# Patient Record
Sex: Female | Born: 1935 | Race: White | Hispanic: No | Marital: Single | Smoking: Never smoker
Health system: Southern US, Community
[De-identification: ages and names within clinical notes are randomized; demographics above are authoritative.]

## PROBLEM LIST (undated history)

## (undated) DIAGNOSIS — T8859XA Other complications of anesthesia, initial encounter: Secondary | ICD-10-CM

## (undated) HISTORY — PX: CATARACT EXTRACTION, BILATERAL: SHX1313

## (undated) HISTORY — PX: ABDOMINAL HYSTERECTOMY: SHX81

## (undated) HISTORY — PX: CHOLECYSTECTOMY: SHX55

## (undated) HISTORY — PX: ERCP: SHX60

## (undated) HISTORY — PX: APPENDECTOMY: SHX54

---

## 1998-08-27 ENCOUNTER — Ambulatory Visit (HOSPITAL_COMMUNITY): Admission: RE | Admit: 1998-08-27 | Discharge: 1998-08-27 | Payer: Self-pay | Admitting: Surgery

## 2000-08-17 ENCOUNTER — Encounter: Payer: Self-pay | Admitting: Internal Medicine

## 2000-08-17 ENCOUNTER — Encounter: Admission: RE | Admit: 2000-08-17 | Discharge: 2000-08-17 | Payer: Self-pay | Admitting: Internal Medicine

## 2002-02-27 ENCOUNTER — Inpatient Hospital Stay (HOSPITAL_COMMUNITY): Admission: EM | Admit: 2002-02-27 | Discharge: 2002-03-03 | Payer: Self-pay | Admitting: Emergency Medicine

## 2002-02-28 ENCOUNTER — Encounter: Payer: Self-pay | Admitting: Internal Medicine

## 2004-10-17 ENCOUNTER — Other Ambulatory Visit: Admission: RE | Admit: 2004-10-17 | Discharge: 2004-10-17 | Payer: Self-pay | Admitting: Radiology

## 2005-01-23 ENCOUNTER — Ambulatory Visit: Payer: Self-pay | Admitting: Internal Medicine

## 2005-02-09 ENCOUNTER — Ambulatory Visit: Payer: Self-pay | Admitting: Internal Medicine

## 2006-08-28 ENCOUNTER — Ambulatory Visit (HOSPITAL_COMMUNITY): Admission: RE | Admit: 2006-08-28 | Discharge: 2006-08-29 | Payer: Self-pay | Admitting: Ophthalmology

## 2006-08-28 ENCOUNTER — Encounter (INDEPENDENT_AMBULATORY_CARE_PROVIDER_SITE_OTHER): Payer: Self-pay | Admitting: Specialist

## 2007-12-26 ENCOUNTER — Encounter: Admission: RE | Admit: 2007-12-26 | Discharge: 2007-12-26 | Payer: Self-pay | Admitting: Internal Medicine

## 2009-01-05 ENCOUNTER — Encounter: Admission: RE | Admit: 2009-01-05 | Discharge: 2009-01-05 | Payer: Self-pay | Admitting: Internal Medicine

## 2010-01-11 ENCOUNTER — Encounter: Admission: RE | Admit: 2010-01-11 | Discharge: 2010-01-11 | Payer: Self-pay | Admitting: Internal Medicine

## 2010-09-09 NOTE — H&P (Signed)
NAME:  Alison Carpenter, Alison Carpenter              ACCOUNT NO.:  [id number]   MEDICAL RECORD NO.:  [id number]                   PATIENT TYPE:  INP   LOCATION:  0474                                 FACILITY:  [facility]   PHYSICIAN:  Wilhemina Bonito. Marina Goodell, M.D. [facility]             DATE OF BIRTH:  [birth year]   DATE OF ADMISSION:  02/27/2002  DATE OF DISCHARGE:                                HISTORY & PHYSICAL   CHIEF COMPLAINT:  Worsening of intermittent right upper quadrant pain now  associated with nausea.   HISTORY OF PRESENT ILLNESS:  Ms. Alison Carpenter is status post remote  cholecystectomy.  For several years, she has had intermittent upper  abdominal and right upper quadrant discomfort.  It has never reached  terribly severe levels.  It was associated with some nausea and vomiting  about two weeks ago.  She was seen by Dr. Su Hilt, her Primary Care  Alison Carpenter, and he provided her with some pain medication called Talacen,  which she stopped because it caused her to have nausea and vomiting.  The  patient was referred to see Dr. Yancey Carpenter this past Monday and he did see  her in the office.  However, in the meantime the patient had been  hospitalized when she was in [place], [place], last week with an episode of  angioedema of undetermined etiology, treated with steroids and  antihistamines at the hospital there.  Dr. Marina Goodell got a CMET which showed her  to have a low potassium at 2.8.  Liver tests were abnormal with normal total  bilirubin 1.1 as well normal alkaline phosphatase at 110.  Her AST was 67,  her ALT was 383.  On the 4th of November, she underwent abdominal ultrasound  which was read by Dr. Lina Sar as having a prominent common bile duct  consistent with her post-cholecystectomy state and mild fatty liver changes  were present.  Dr. Marina Goodell had set her up to have an upper endoscopy on the  17th.  Her symptoms got worse over the last few days and she made  arrangements for the upper endoscopy  to be moved up to the 7th of November.  However, she did not make it to that appointment as she had such severe pain  that Dr. Lamar Sprinkles office advised her to go to the emergency room where he  ended up admitting her with severe right upper quadrant pain.  She had  developed nausea and vomiting on the day of admission, which was not blood  or coffee ground containing.  Stools were unaffected and were unremarkable.  In the emergency room her white blood cell count was normal and her  hemoglobin was 15.1.  Lipase was 198, amylase was 245.  She still was  somewhat potassium depleted with potassium of 3.2.  Total bilirubin,  alkaline phosphatase, AST, and ALT were all up compared with levels taken  earlier in the week and are dictated below.  She was admitted by Dr. Marina Goodell  for control of her pain which he felt was likely secondary to  choledocholithiasis as well as pancreatitis secondary to biliary stones.  She was setup for ERCP on the following day, the 7th of November.   PAST MEDICAL HISTORY:  1. The patient really does not have any chronic medical problems.  2. History of cluster headaches on one occasion.  3. History of angioedema which occurred just about a week ago and she had     had it previously about seven years ago, both times they did not know     what the cause was.  4. The patient has a history of nodular breasts and is status post bilateral     partial mastectomies.  All the pathology on this was negative.   PAST SURGICAL HISTORY:  She has undergone a number of surgeries including  tonsillectomy, appendectomy, and C-section on one occasion.  She is status  post partial colon resection in 1976 because of multiple colon polyps.  Dr.  Samuella Cota did the surgery.  She has had follow up colonoscopies with recurrent  polyps performed by Dr. Ovidio Kin.  Her latest colonoscopy was probably  about three years ago and was the only colonoscopy she has had where she did  not have recurrent  polyps.  The patient is status post cholecystectomy.  Status post hysterectomy.   ALLERGIES:  She is intolerant to CODEINE and to [facility].   CURRENT MEDICATIONS:  She takes Claritin p.r.n.   FAMILY HISTORY:  Father had an MI when the patient was [age] years old so he  was probably in his 69s or 57s at that time, but he lived to be 37 and died  secondary to pneumonia and had a history of dementia occurring late in his  life.  Mother lived to age 53.  She developed pneumonia after a hip fracture  and died.  One brother died with lung cancer and he was a smoker.   SOCIAL HISTORY:  The patient still works full time as a Careers adviser.  She  has been married for 62 years to her current husband and they live in  [place].  She drinks about three to four glasses of wine in a years'  time.  Never has been a smoker.  She and her husband have three daughters.   REVIEW OF SYSTEMS:  NEUROLOGIC:  Occasional mild headaches but no recurrence  of the cluster headaches which she has had in the past.  No focal muscular  weakness.  HEENT:  Does wear glasses to drive but does not need them to see  the television or movies.  PULMONARY:  No shortness of breath, no cough.  Able to walk 20 minutes or more without significant dyspnea.  CARDIOVASCULAR:  The patient had a bicycle stress test last year, which was  negative for any ischemia, this per the patient's report.  Does  not have  palpitations or chest pain.  Does not generally have edema but she did have  those episodes of angioedema as recently as last week.  GI:  Please see  above.  The patient denies reflux, dysphagia, anorexia.  GU:  No bladder  incontinence.  No dysuria or frequency.  ENDOCRINE:  No night sweats, no  significant weight fluctuation, no temperature intolerances.  No history of  elevated blood glucoses.  MUSCULOSKELETAL:  Does not have significant  arthralgias or joint deformities.  GENERAL:  Her appetite is good, weight is stable.   PSYCHIATRIC:  No history of depression,  no anxiety.  DERMATOLOGIC:  The patient has had some warts removed from her face.  No history of skin  cancer.   PHYSICAL EXAMINATION:  GENERAL:  The patient is a pleasant older white  female who appears to be in good health.  She is in some pain.  VITAL SIGNS:  Blood pressure 199/95, pulse 80, respirations 24, temperature  97.4.  HEENT:  There is some mild icterus present.  Extraocular movements are  intact, PERRL.  Oropharynx is moist and clear with dentition in good repair  with partial plates in place.  Mucous is moist.  NECK:  Without JVD, masses, thyromegaly, or bruits.  CHEST:  Clear to auscultation and percussion bilaterally.  COR:  There is regular rate and rhythm.  No murmurs, rubs, or gallops.  PMI  not displaced.  ABDOMEN:  Soft and tender in the right upper quadrant without guarding or  rebound.  Bowel sounds are active.  No masses or hepatosplenomegaly.  RECTAL/GU/BREASTS:  Exams were deferred.  EXTREMITIES:  Notable for no edema, cyanosis or clubbing.  NEUROLOGIC:  The patient is alert and oriented x3.  There is no tremor  present.  Grip strength is 5/5 bilaterally.  No gross focal deficits.  PSYCHIATRIC:  Affect is bright.  No emotional lability.  DERMATOLOGIC:  There is no obvious jaundice to the skin.  No spider  angiomata.  No rash.  MUSCULOSKELETAL:  No gross joint deformities.  Spine is non-kyphotic.   LABORATORY DATA:  White blood cell count 9.8, hemoglobin 15.1, hematocrit  44, MCV 91.8.  Platelets 261.  Amylase is 245, lipase 198.  Total bilirubin  3.5, alkaline phosphatase 144, AST 348, and ALT of 410.  Albumin 3.8.  BUN  is 7, creatinine 0.9.  Glucose elevated at 143.  Potassium slightly low at  3.2.  Chloride is 106.  Carbon dioxide is 27.  Sodium 139.  Urinalysis is  negative.   IMPRESSION:  1. Right upper quadrant pain probably secondary to choledocholithiasis.  2. Pancreatitis secondary to biliary stones. Rule  out retained common duct     stones versus passed stones.  3. Status post remote cholecystectomy.  4. History of multiple colon polyps, at one point requiring partial colon     resection.  Have had regular follow up colonoscopies with recurrent     polyps per Dr. Ovidio Kin.  5. Status post bilateral partial mastectomies owing to nodular breast     disease.  6. Recent angioedema of unclear etiology.   PLAN:  The patient is admitted by Dr. Yancey Carpenter for pain control, IV  fluids.  She was started in the emergency department and is to continue on  IV Unasyn.  She will be kept NPO after midnight with plan for ERCP  evaluation by Dr. Leone Payor within the next 24 hours.  Risks, complications  and alternatives regarding ERCP were discussed by Dr. Marina Goodell with the patient  and her husband, and they understood and agreed to proceed with ERCP.     Brett Canales, P.A. [facility]                    John N. Marina Goodell, M.D. [facility]    SG/MEDQ  D:  02/28/2002  T:  02/28/2002  Job:  [id number]

## 2010-09-09 NOTE — Op Note (Signed)
NAMEBERNETHA, ANSCHUTZ          ACCOUNT NO.:  [id number]   MEDICAL RECORD NO.:  [id number]          PATIENT TYPE:  AMB   LOCATION:  SDS                          FACILITY:  [facility]   PHYSICIAN:  John D. Ashley Royalty, M.D. DATE OF BIRTH:  22-Nov-1917   DATE OF PROCEDURE:  08/28/2006  DATE OF DISCHARGE:                               OPERATIVE REPORT   ADMISSION DIAGNOSIS:  Dislocated intraocular lens into the vitreous,  left eye, retained lens material, left eye, glaucoma, left eye, retained  lens material anterior segment, left eye/.   PROCEDURES:  Pars plana vitrectomy, removal of retained lens material  from vitreous, removal of retained lens material from anterior segment,  panretinal photocoagulation, removal of intraocular lens from the  vitreous, placement of secondary intraocular lens with suture, gas fluid  exchange, left eye.   SURGEON:  Beulah Gandy. Ashley Royalty, M.D.   ASSISTANT:  Bryan Lemma. Lundquist, P.A.-C.   ANESTHESIA:  General.   DETAILS:  Usual prep and drape, peritomy from 8 o'clock to 4 o'clock.  The 25 gauge trocars were placed at 10, 2 and 4 o'clock.  Infusion at 4  o'clock.  The diamond knife was used to make a three layered scleral  tunnel wound from 10 to 2 o'clock.  The pars plana vitrectomy was begun  with the 25 gauge system in the pupillary axis. Lens material and  capsular material was removed.  The vitreous cutter was moved into the  anterior segment down to the 6 o'clock chamber angle and lens material  was pulled out of this area.  All lens material was removed from this  area.  The vitrectomy was carried posteriorly where large clumps of  white material was encountered.  This was carefully removed under low  suction and rapid cutting.  Some pearly white fragments were seen lying  on the retinal surface and removed with the vitreous cutter.  Care was  taken not to touch the retina at anytime. The vitrectomy was carried to  the periphery and scleral depression  was used to remove lens material  from the vitreous base for 360 degrees.  The endolaser was positioned in  the eye and 150 burns were placed around the retinal periphery with a  power 1500 milliwatts, 1000 microns each, and 0.1 seconds each.  The  intraocular lens was ensnared in vitreous. This vitreous was carefully  were trimmed from around the lens and the lens was allowed to freely  float in the vitreous. The corneoscleral wound was opened with a diamond  knife and the lens was passed from vitreous to anterior chamber and out  through the wound.  It was sent for specimen identification. Two Prolene  sutures were passed beneath the scleral flaps at 3 and 9 o'clock.  The  sutures were passed behind the iris and anterior to the capsular  remnants. The sutures were externalized through the corneoscleral wound.  A new intraocular lens was brought onto the field made by [employer]., model CZ70BD, power 18.0D, length 12.5 MM, optic 7.0  MM, serial number [id number], expiration date 02/2008.  The lens was  brought onto  the field, tested and cleaned.  The sutures were attached  to the eyelets of the lens.  The lens was passed through the  corneoscleral wound and into the posterior chamber.  It was dialed into  place.  The Prolene sutures were drawn tight externally.  They were  sutured beneath the scleral flaps and scleral flaps were allowed to lie  on top of the sutures. The corneal wound was closed with five  interrupted 10-0 nylon sutures.  The wound was tested and found to be  watertight. Additional vitrectomy was carried out at this time removing  some pigment from the vitreous cavity.  A 30% gas fluid exchange was  performed.  The instruments and trocars were removed from the scleral  locations.  The conjunctiva was closed with 7-0 chromic suture.  Polymyxin and gentamicin were irrigated into tenon's space. Decadron 10  mg was injected in the lower  subconjunctival space.   TobraDex ophthalmic ointment was placed.  Closing pressure was 10 with a Banker.  A patch and shield  were placed.  The patient was awakened and taken to recovery in  satisfactory condition.  Complications none.  Duration 1 hour 30  minutes.      Beulah Gandy. Ashley Royalty, M.D.  Electronically Signed     JDM/MEDQ  D:  08/28/2006  T:  08/28/2006  Job:  [id number]

## 2010-09-09 NOTE — Discharge Summary (Signed)
NAME:  Alison Carpenter, Alison Carpenter              ACCOUNT NO.:  [id number]   MEDICAL RECORD NO.:  [id number]                   PATIENT TYPE:  INP   LOCATION:  0474                                 FACILITY:  [facility]   PHYSICIAN:  Wilhemina Bonito. Marina Goodell, M.D. [facility]             DATE OF BIRTH:  02-16-1918   DATE OF ADMISSION:  02/27/2002  DATE OF DISCHARGE:  03/03/2002                                 DISCHARGE SUMMARY   ADMISSION DIAGNOSES:  1. An 77 year old white female with recurrent right upper quadrant abdominal     pain felt secondary to choledocholithiasis.  2. Low-grade pancreatitis secondary to above, rule out a retained stone     versus a recently passed stone.  3. Status post remote cholecystectomy.  4. History of multiple colon polyps status post partial colon resection.  5. Status post bilateral partial mastectomies secondary to nodular breast     disease.  6. A recent angioedema, etiology unclear.   DISCHARGE DIAGNOSES:  1. Biliary pancreatitis status post endoscopic retrograde     cholangiopancreatography and spincterotomy on February 28, 2002.  Findings     consistent with passed common bile duct stone.  2. Nausea and vomiting post endoscopic retrograde cholangiopancreatography     secondary to pancreatitis now resolving.  3. 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  4. Esophageal stricture status post dilation.  5. Other diagnoses as listed above.   CONSULTATIONS:  None.   PROCEDURES:  ERCP per Dr. Leone Payor on February 28, 2002 with dilation of  esophageal stricture at time of ERCP.   BRIEF HISTORY:  The patient is an 77 year old white female status post  remote cholecystectomy.  She had had intermittent abdominal pain and right  upper quadrant pain over the past several years which had never been severe.  It has been at times associated with nausea, and she began with increasing  symptoms about two weeks ago with pain, nausea and vomiting.  She had been  seen  by Dr. Su Hilt, her primary care physician, who had given her Talacen  for pain which she felt made her more nauseated.  She was referred to Dr.  Marina Goodell four days prior to this admission and labs were obtained showing a  potassium of 2.8, LFTs abnormal with a total bilirubin of 1.1, alk phos 110,  AST of 67, and ALT of 383.  She then had abdominal ultrasound showing a  prominent common bile duct consistent with post cholecystectomy state, mild  fatty changes, no definite stones.  She was then set up to have upper  endoscopy; however, her symptoms worsened and she was advised to come to the  emergency room and eventually with right upper quadrant pain, nausea and  vomiting.  In the emergency room, CBC was unremarkable, lipase 198, amylase  of 245, potassium improved at 3.2; total bilirubin, alk phos, SGOT and SGPT  on the rise and she is admitted at this time for pain control, antiemetics,  and will plan to  proceed with an ERCP.   LABORATORY DATA:  On November 6th, total bilirubin 3.5, alk phos 144, ALT  410, AST 348, amylase 245, lipase 198.  UA was negative.  WBC 9.8,  hemoglobin 15.1, hematocrit of 44.0, MCV of 91.8, platelets 261.  Followup  on November 8th, WBC of 8.3, hemoglobin 13.2, hematocrit 38.7.  Electrolytes  on admission normal with the exception of a potassium of 3.2.  Followup on  November 9th, potassium 3.6, and albumin 3.8 on admission.  Followup on the  9th at 2.9 and LFTs on November 9th showed a total bilirubin of 1, alk phos  123, ALT 367, AST of 152, and a followup on November 10th with a total  bilirubin of 0.8, alk phos 73, SGPT 278, SGOT 73, albumin 2.9, amylase down  to 77, lipase down to 35.  WBC 6.8, hemoglobin 13.1.   IMAGING STUDIES:  ERCP showing a biliary tract dilation but no filling  defect.   HOSPITAL COURSE:  The patient was admitted to the service of Dr. Marina Goodell and  then cared for by Dr. Leone Payor who was covering the hospital.  She was  initially kept  NPO, placed on IV fluids, covered with IV Unasyn, given  Demerol and Phenergan as needed for control of pain and nausea and was  scheduled for an ERCP the following morning with Dr. Leone Payor.  This exam  showed an esophageal stricture with the lumen diameter of approximately 13  mm.  She was Savary-dilated to 15 mm.  She was also noted to have a dilated  common bile duct diffusely approximately 14 mm and an inflamed edematous  major papilla suggestive of a passed stone.  There was also a suspicion of a  papillary stenosis and spincterotomy was done with duct clearing.  The  patient tolerated the procedure well.  Postprocedure, did have some increase  in her abdominal pain and nausea.  By November 9th, she was feeling better  overall with less pain but still queasy and had actually vomited one time.  She was therefore kept in the hospital, placed back on a clear liquid diet.  By November 10th, she was feeling much better, her labs were all  normalizing, and she was asking for discharge to home.  Her diet was then  advanced to a low fat full liquid.   PLAN:  Plan is for discharge home this afternoon with follow up with Dr.  Marina Goodell in the office on November 18th at 2:15.  The patient is to call in the  interim for recurrent pain or vomiting.   DISCHARGE MEDICATIONS:  Phenergan 25 mg q.6 h. p.r.n. nausea or vomiting.  She will be given a limited supply of Ultram, one every 8 hours for severe  pain and asked to take Extra Strength Tylenol otherwise due to her codeine  and Darvocet intolerance.  Other medication:  Claritin on a p.r.n. basis.   DIET:  Low fat full liquids for 24 hours and then low fat for two weeks.   CONDITION ON DISCHARGE:  Stable, improved.     Amy Esterwood, P.A.-C. [facility]                John N. Marina Goodell, M.D. [facility]    AE/MEDQ  D:  03/03/2002  T:  03/03/2002  Job:  [id number]   cc:   Antony Madura, M.D. [address], [address]  [place]  [place] [address]  Fax: [phone]

## 2011-01-24 ENCOUNTER — Other Ambulatory Visit: Payer: Self-pay | Admitting: Internal Medicine

## 2011-01-24 DIAGNOSIS — Z1231 Encounter for screening mammogram for malignant neoplasm of breast: Secondary | ICD-10-CM

## 2011-02-14 ENCOUNTER — Ambulatory Visit
Admission: RE | Admit: 2011-02-14 | Discharge: 2011-02-14 | Disposition: A | Discharge status: Home / Self Care | Payer: BC Managed Care – PPO | Source: Ambulatory Visit | Attending: Internal Medicine | Admitting: Internal Medicine

## 2011-02-14 DIAGNOSIS — Z1231 Encounter for screening mammogram for malignant neoplasm of breast: Secondary | ICD-10-CM

## 2012-11-06 ENCOUNTER — Other Ambulatory Visit: Payer: Self-pay | Admitting: Dermatology

## 2018-05-27 ENCOUNTER — Other Ambulatory Visit: Payer: Self-pay | Admitting: Internal Medicine

## 2018-05-27 DIAGNOSIS — I7789 Other specified disorders of arteries and arterioles: Secondary | ICD-10-CM

## 2018-05-30 ENCOUNTER — Ambulatory Visit
Admission: RE | Admit: 2018-05-30 | Discharge: 2018-05-30 | Disposition: A | Discharge status: Home / Self Care | Payer: Medicare Other | Source: Ambulatory Visit | Attending: Internal Medicine | Admitting: Internal Medicine

## 2018-05-30 DIAGNOSIS — I7789 Other specified disorders of arteries and arterioles: Secondary | ICD-10-CM

## 2020-06-10 ENCOUNTER — Other Ambulatory Visit: Payer: Self-pay

## 2020-06-10 ENCOUNTER — Inpatient Hospital Stay (HOSPITAL_COMMUNITY): Payer: Medicare Other

## 2020-06-10 ENCOUNTER — Encounter (HOSPITAL_COMMUNITY): Payer: Self-pay | Admitting: Emergency Medicine

## 2020-06-10 ENCOUNTER — Inpatient Hospital Stay (HOSPITAL_COMMUNITY)
Admission: EM | Admit: 2020-06-10 | Discharge: 2020-06-16 | DRG: 481 | Disposition: A | Discharge status: Home Health | Payer: Medicare Other | Attending: Internal Medicine | Admitting: Internal Medicine

## 2020-06-10 ENCOUNTER — Inpatient Hospital Stay (HOSPITAL_COMMUNITY): Payer: Medicare Other | Admitting: Anesthesiology

## 2020-06-10 ENCOUNTER — Encounter (HOSPITAL_COMMUNITY)
Admission: EM | Disposition: A | Discharge status: Home Health | Payer: Self-pay | Source: Home / Self Care | Attending: Student

## 2020-06-10 ENCOUNTER — Emergency Department (HOSPITAL_COMMUNITY): Payer: Medicare Other

## 2020-06-10 DIAGNOSIS — Z681 Body mass index (BMI) 19 or less, adult: Secondary | ICD-10-CM

## 2020-06-10 DIAGNOSIS — S72145A Nondisplaced intertrochanteric fracture of left femur, initial encounter for closed fracture: Secondary | ICD-10-CM | POA: Diagnosis present

## 2020-06-10 DIAGNOSIS — R636 Underweight: Secondary | ICD-10-CM | POA: Diagnosis present

## 2020-06-10 DIAGNOSIS — T40605A Adverse effect of unspecified narcotics, initial encounter: Secondary | ICD-10-CM | POA: Diagnosis not present

## 2020-06-10 DIAGNOSIS — S72002A Fracture of unspecified part of neck of left femur, initial encounter for closed fracture: Secondary | ICD-10-CM

## 2020-06-10 DIAGNOSIS — W010XXA Fall on same level from slipping, tripping and stumbling without subsequent striking against object, initial encounter: Secondary | ICD-10-CM | POA: Diagnosis present

## 2020-06-10 DIAGNOSIS — Z20822 Contact with and (suspected) exposure to covid-19: Secondary | ICD-10-CM | POA: Diagnosis present

## 2020-06-10 DIAGNOSIS — K5520 Angiodysplasia of colon without hemorrhage: Secondary | ICD-10-CM | POA: Diagnosis present

## 2020-06-10 DIAGNOSIS — R110 Nausea: Secondary | ICD-10-CM | POA: Diagnosis not present

## 2020-06-10 DIAGNOSIS — D509 Iron deficiency anemia, unspecified: Secondary | ICD-10-CM | POA: Diagnosis present

## 2020-06-10 DIAGNOSIS — D62 Acute posthemorrhagic anemia: Secondary | ICD-10-CM | POA: Diagnosis not present

## 2020-06-10 DIAGNOSIS — R5381 Other malaise: Secondary | ICD-10-CM

## 2020-06-10 DIAGNOSIS — S72009A Fracture of unspecified part of neck of unspecified femur, initial encounter for closed fracture: Secondary | ICD-10-CM | POA: Diagnosis present

## 2020-06-10 DIAGNOSIS — Z419 Encounter for procedure for purposes other than remedying health state, unspecified: Secondary | ICD-10-CM

## 2020-06-10 DIAGNOSIS — W19XXXA Unspecified fall, initial encounter: Secondary | ICD-10-CM | POA: Diagnosis not present

## 2020-06-10 HISTORY — PX: FEMUR IM NAIL: SHX1597

## 2020-06-10 LAB — CBC WITH DIFFERENTIAL/PLATELET
Abs Immature Granulocytes: 0.06 10*3/uL (ref 0.00–0.07)
Basophils Absolute: 0.1 10*3/uL (ref 0.0–0.1)
Basophils Relative: 1 %
Eosinophils Absolute: 0.1 10*3/uL (ref 0.0–0.5)
Eosinophils Relative: 1 %
HCT: 40.7 % (ref 36.0–46.0)
Hemoglobin: 13.2 g/dL (ref 12.0–15.0)
Immature Granulocytes: 1 %
Lymphocytes Relative: 10 %
Lymphs Abs: 1 10*3/uL (ref 0.7–4.0)
MCH: 31.8 pg (ref 26.0–34.0)
MCHC: 32.4 g/dL (ref 30.0–36.0)
MCV: 98.1 fL (ref 80.0–100.0)
Monocytes Absolute: 0.6 10*3/uL (ref 0.1–1.0)
Monocytes Relative: 6 %
Neutro Abs: 7.9 10*3/uL — ABNORMAL HIGH (ref 1.7–7.7)
Neutrophils Relative %: 81 %
Platelets: 229 10*3/uL (ref 150–400)
RBC: 4.15 MIL/uL (ref 3.87–5.11)
RDW: 13.2 % (ref 11.5–15.5)
WBC: 9.6 10*3/uL (ref 4.0–10.5)
nRBC: 0 % (ref 0.0–0.2)

## 2020-06-10 LAB — BASIC METABOLIC PANEL
Anion gap: 10 (ref 5–15)
BUN: 13 mg/dL (ref 8–23)
CO2: 28 mmol/L (ref 22–32)
Calcium: 8.8 mg/dL — ABNORMAL LOW (ref 8.9–10.3)
Chloride: 103 mmol/L (ref 98–111)
Creatinine, Ser: 0.58 mg/dL (ref 0.44–1.00)
GFR, Estimated: >60 mL/min (ref >60)
Glucose, Bld: 98 mg/dL (ref 70–99)
Potassium: 3.5 mmol/L (ref 3.5–5.1)
Sodium: 141 mmol/L (ref 135–145)

## 2020-06-10 LAB — TYPE AND SCREEN
ABO/RH(D): O POS
Antibody Screen: NEGATIVE

## 2020-06-10 LAB — PROTIME-INR
INR: 1.1 (ref 0.8–1.2)
Prothrombin Time: 13.8 seconds (ref 11.4–15.2)

## 2020-06-10 LAB — RESP PANEL BY RT-PCR (FLU A&B, COVID) ARPGX2
Influenza A by PCR: NEGATIVE
Influenza B by PCR: NEGATIVE
SARS Coronavirus 2 by RT PCR: NEGATIVE

## 2020-06-10 SURGERY — INSERTION, INTRAMEDULLARY ROD, FEMUR
Anesthesia: Spinal | Site: Hip | Laterality: Left

## 2020-06-10 MED ORDER — MORPHINE SULFATE (PF) 2 MG/ML IV SOLN
0.5000 mg | INTRAVENOUS | Status: DC | PRN
Start: 2020-06-10 — End: 2020-06-10
  Administered 2020-06-10: 0.5 mg via INTRAVENOUS
  Filled 2020-06-10: qty 1

## 2020-06-10 MED ORDER — ACETAMINOPHEN 325 MG PO TABS
325.0000 mg | ORAL_TABLET | Freq: Four times a day (QID) | ORAL | Status: DC | PRN
Start: 2020-06-11 — End: 2020-06-12

## 2020-06-10 MED ORDER — BUPIVACAINE IN DEXTROSE 0.75-8.25 % IT SOLN
INTRATHECAL | Status: DC | PRN
  Administered 2020-06-10: 1.55 mL via INTRATHECAL

## 2020-06-10 MED ORDER — OXYCODONE HCL 5 MG/5ML PO SOLN: 5.0000 mg | Freq: Once | ORAL | Status: DC | PRN

## 2020-06-10 MED ORDER — ACETAMINOPHEN 325 MG PO TABS
650.0000 mg | ORAL_TABLET | Freq: Once | ORAL | Status: AC
  Administered 2020-06-10: 650 mg via ORAL
  Filled 2020-06-10: qty 2

## 2020-06-10 MED ORDER — HYDROCODONE-ACETAMINOPHEN 5-325 MG PO TABS
1.0000 | ORAL_TABLET | ORAL | Status: DC | PRN
  Administered 2020-06-12: 2 via ORAL
  Filled 2020-06-10 (×2): qty 2
  Filled 2020-06-10: qty 1

## 2020-06-10 MED ORDER — FENTANYL CITRATE (PF) 100 MCG/2ML IJ SOLN
12.5000 ug | Freq: Once | INTRAMUSCULAR | Status: AC
  Administered 2020-06-10: 12.5 ug via INTRAVENOUS
  Filled 2020-06-10: qty 2

## 2020-06-10 MED ORDER — ACETAMINOPHEN 325 MG PO TABS: 325.0000 mg | ORAL_TABLET | ORAL | Status: DC | PRN

## 2020-06-10 MED ORDER — ONDANSETRON HCL 4 MG/2ML IJ SOLN
4.0000 mg | Freq: Four times a day (QID) | INTRAMUSCULAR | Status: DC | PRN
  Administered 2020-06-11 – 2020-06-12 (×3): 4 mg via INTRAVENOUS
  Filled 2020-06-10 (×3): qty 2

## 2020-06-10 MED ORDER — SODIUM CHLORIDE 0.45 % IV SOLN: INTRAVENOUS | Status: DC

## 2020-06-10 MED ORDER — OXYCODONE HCL 5 MG PO TABS: 5.0000 mg | ORAL_TABLET | Freq: Once | ORAL | Status: DC | PRN

## 2020-06-10 MED ORDER — CEFAZOLIN SODIUM-DEXTROSE 2-4 GM/100ML-% IV SOLN
2.0000 g | INTRAVENOUS | Status: AC
  Administered 2020-06-10: 2 g via INTRAVENOUS

## 2020-06-10 MED ORDER — FENTANYL CITRATE (PF) 100 MCG/2ML IJ SOLN: 25.0000 ug | INTRAMUSCULAR | Status: DC | PRN

## 2020-06-10 MED ORDER — HYDROCODONE-ACETAMINOPHEN 5-325 MG PO TABS
1.0000 | ORAL_TABLET | Freq: Four times a day (QID) | ORAL | Status: DC | PRN
Start: 2020-06-10 — End: 2020-06-10

## 2020-06-10 MED ORDER — EPHEDRINE SULFATE-NACL 50-0.9 MG/10ML-% IV SOSY
PREFILLED_SYRINGE | INTRAVENOUS | Status: DC | PRN
  Administered 2020-06-10: 15 mg via INTRAVENOUS
  Administered 2020-06-10: 10 mg via INTRAVENOUS

## 2020-06-10 MED ORDER — METOCLOPRAMIDE HCL 5 MG PO TABS: 5.0000 mg | ORAL_TABLET | Freq: Three times a day (TID) | ORAL | Status: DC | PRN

## 2020-06-10 MED ORDER — 0.9 % SODIUM CHLORIDE (POUR BTL) OPTIME
TOPICAL | Status: DC | PRN
  Administered 2020-06-10: 1000 mL

## 2020-06-10 MED ORDER — PHENOL 1.4 % MT LIQD: 1.0000 | OROMUCOSAL | Status: DC | PRN

## 2020-06-10 MED ORDER — MENTHOL 3 MG MT LOZG: 1.0000 | LOZENGE | OROMUCOSAL | Status: DC | PRN

## 2020-06-10 MED ORDER — FENTANYL CITRATE (PF) 100 MCG/2ML IJ SOLN
INTRAMUSCULAR | Status: AC
  Filled 2020-06-10: qty 2

## 2020-06-10 MED ORDER — MORPHINE SULFATE (PF) 2 MG/ML IV SOLN
0.5000 mg | INTRAVENOUS | Status: DC | PRN
  Administered 2020-06-11: 1 mg via INTRAVENOUS
  Filled 2020-06-10: qty 1

## 2020-06-10 MED ORDER — BUPIVACAINE HCL (PF) 0.5 % IJ SOLN
INTRAMUSCULAR | Status: DC | PRN
  Administered 2020-06-10: 19 mL

## 2020-06-10 MED ORDER — PROPOFOL 10 MG/ML IV BOLUS
INTRAVENOUS | Status: DC | PRN
  Administered 2020-06-10 (×3): 20 mg via INTRAVENOUS

## 2020-06-10 MED ORDER — POVIDONE-IODINE 10 % EX SWAB: 2.0000 "application " | Freq: Once | CUTANEOUS | Status: AC

## 2020-06-10 MED ORDER — BUPIVACAINE HCL (PF) 0.5 % IJ SOLN
INTRAMUSCULAR | Status: AC
  Filled 2020-06-10: qty 30

## 2020-06-10 MED ORDER — ASPIRIN EC 81 MG PO TBEC
81.0000 mg | DELAYED_RELEASE_TABLET | Freq: Every day | ORAL | Status: DC
  Administered 2020-06-11 – 2020-06-13 (×3): 81 mg via ORAL
  Filled 2020-06-10 (×3): qty 1

## 2020-06-10 MED ORDER — ONDANSETRON HCL 4 MG/2ML IJ SOLN: 4.0000 mg | Freq: Once | INTRAMUSCULAR | Status: DC | PRN

## 2020-06-10 MED ORDER — PROPOFOL 10 MG/ML IV BOLUS
INTRAVENOUS | Status: AC
  Filled 2020-06-10: qty 20

## 2020-06-10 MED ORDER — METOCLOPRAMIDE HCL 5 MG/ML IJ SOLN
5.0000 mg | Freq: Three times a day (TID) | INTRAMUSCULAR | Status: DC | PRN
  Administered 2020-06-11: 10 mg via INTRAVENOUS
  Filled 2020-06-10: qty 2

## 2020-06-10 MED ORDER — ACETAMINOPHEN 500 MG PO TABS
500.0000 mg | ORAL_TABLET | Freq: Four times a day (QID) | ORAL | Status: AC
  Administered 2020-06-11 (×4): 500 mg via ORAL
  Filled 2020-06-10 (×4): qty 1

## 2020-06-10 MED ORDER — MEPERIDINE HCL 50 MG/ML IJ SOLN: 6.2500 mg | INTRAMUSCULAR | Status: DC | PRN

## 2020-06-10 MED ORDER — ENOXAPARIN SODIUM 30 MG/0.3ML ~~LOC~~ SOLN
30.0000 mg | SUBCUTANEOUS | Status: DC
  Administered 2020-06-11 – 2020-06-14 (×4): 30 mg via SUBCUTANEOUS
  Filled 2020-06-10 (×4): qty 0.3

## 2020-06-10 MED ORDER — DOCUSATE SODIUM 100 MG PO CAPS
100.0000 mg | ORAL_CAPSULE | Freq: Two times a day (BID) | ORAL | Status: DC
  Administered 2020-06-11 – 2020-06-14 (×7): 100 mg via ORAL
  Filled 2020-06-10 (×9): qty 1

## 2020-06-10 MED ORDER — CHLORHEXIDINE GLUCONATE 4 % EX LIQD: 60.0000 mL | Freq: Once | CUTANEOUS | Status: AC

## 2020-06-10 MED ORDER — FENTANYL CITRATE (PF) 100 MCG/2ML IJ SOLN
INTRAMUSCULAR | Status: DC | PRN
  Administered 2020-06-10: 50 ug via INTRAVENOUS

## 2020-06-10 MED ORDER — ONDANSETRON HCL 4 MG PO TABS
4.0000 mg | ORAL_TABLET | Freq: Four times a day (QID) | ORAL | Status: DC | PRN
  Administered 2020-06-13 – 2020-06-15 (×2): 4 mg via ORAL
  Filled 2020-06-10 (×2): qty 1

## 2020-06-10 MED ORDER — ACETAMINOPHEN 160 MG/5ML PO SOLN
325.0000 mg | ORAL | Status: DC | PRN
Start: 2020-06-10 — End: 2020-06-10

## 2020-06-10 MED ORDER — LACTATED RINGERS IV SOLN: INTRAVENOUS | Status: DC | PRN

## 2020-06-10 SURGICAL SUPPLY — 37 items
BAG ZIPLOCK 12X15 (MISCELLANEOUS) ×2 IMPLANT
BIT DRILL CANN LG 4.3MM (BIT) IMPLANT
BNDG COHESIVE 4X5 TAN STRL (GAUZE/BANDAGES/DRESSINGS) ×2 IMPLANT
CHLORAPREP W/TINT 26 (MISCELLANEOUS) ×2 IMPLANT
COVER BACK TABLE 60X90IN (DRAPES) ×2 IMPLANT
COVER SURGICAL LIGHT HANDLE (MISCELLANEOUS) ×2 IMPLANT
COVER WAND RF STERILE (DRAPES) IMPLANT
DRAPE STERI IOBAN 125X83 (DRAPES) ×2 IMPLANT
DRILL BIT CANN LG 4.3MM (BIT) ×2
DRSG PAD ABDOMINAL 8X10 ST (GAUZE/BANDAGES/DRESSINGS) ×2 IMPLANT
ELECT REM PT RETURN 15FT ADLT (MISCELLANEOUS) ×2 IMPLANT
EVACUATOR 1/8 PVC DRAIN (DRAIN) ×2 IMPLANT
GAUZE SPONGE 4X4 12PLY STRL (GAUZE/BANDAGES/DRESSINGS) ×2 IMPLANT
GAUZE XEROFORM 5X9 LF (GAUZE/BANDAGES/DRESSINGS) ×2 IMPLANT
GLOVE ORTHO TXT STRL SZ7.5 (GLOVE) ×2 IMPLANT
GLOVE SRG 8 PF TXTR STRL LF DI (GLOVE) ×1 IMPLANT
GLOVE SURG UNDER POLY LF SZ8 (GLOVE) ×2
GOWN STRL REUS W/TWL LRG LVL3 (GOWN DISPOSABLE) ×2 IMPLANT
GUIDEPIN 3.2X17.5 THRD DISP (PIN) ×1 IMPLANT
HIP FRA NAIL LAG SCREW 10.5X90 (Orthopedic Implant) ×2 IMPLANT
KIT BASIN OR (CUSTOM PROCEDURE TRAY) ×2 IMPLANT
KIT TURNOVER KIT A (KITS) ×2 IMPLANT
NAIL HIP FRACT 130D 11X180 (Screw) ×1 IMPLANT
PACK GENERAL/GYN (CUSTOM PROCEDURE TRAY) ×2 IMPLANT
PENCIL SMOKE EVACUATOR (MISCELLANEOUS) IMPLANT
PROTECTOR NERVE ULNAR (MISCELLANEOUS) ×2 IMPLANT
SCREW BONE CORTICAL 5.0X36 (Screw) ×1 IMPLANT
SCREW LAG HIP FRA NAIL 10.5X90 (Orthopedic Implant) IMPLANT
STAPLER VISISTAT 35W (STAPLE) ×2 IMPLANT
SUT ETHILON 2 0 PS N (SUTURE) ×4 IMPLANT
SUT VIC AB 1 CT1 27 (SUTURE) ×4
SUT VIC AB 1 CT1 27XBRD ANTBC (SUTURE) ×2 IMPLANT
SUT VIC AB 2-0 CT1 27 (SUTURE) ×4
SUT VIC AB 2-0 CT1 TAPERPNT 27 (SUTURE) ×2 IMPLANT
TAPE CLOTH SURG 6X10 WHT LF (GAUZE/BANDAGES/DRESSINGS) ×1 IMPLANT
TOWEL OR 17X26 10 PK STRL BLUE (TOWEL DISPOSABLE) ×4 IMPLANT
TRAY FOLEY MTR SLVR 14FR STAT (SET/KITS/TRAYS/PACK) ×1 IMPLANT

## 2020-06-10 NOTE — ED Triage Notes (Signed)
BIBA Per EMS:  Pt coming from work; stepped backwards and fell backwards. Minimal pain in L hip sitting; extreme pain standing; Denies LOC, denies blood thinners, denies hitting head.  Vitals WDL

## 2020-06-10 NOTE — Consult Note (Signed)
 Reason for Consult:fall with left closed IT hip fracture Referring Physician: Tyrone Kyle, DO  Alison Carpenter is an 87 y.o. female.  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  She stumbled backwards on her shoe caught on the carpet suffering acute left hip pain and inability to ambulate.  She assisted to a chair eventually was brought to the emergency room where x-rays demonstrated a minimally displaced intertrochanteric hip fracture.  Patient had previous surgeries include hysterectomy partial colon resection, cholecystectomy and appendectomy without problems.  She does not smoke drink or do drugs.  Patient is alert cooperative and talkative.  Her daughter is at the bedside.  History reviewed. No pertinent past medical history.  Past Surgical History:  Procedure Laterality Date  . ABDOMINAL HYSTERECTOMY    . APPENDECTOMY    . CATARACT EXTRACTION, BILATERAL    . CHOLECYSTECTOMY      History reviewed. No pertinent family history.  Social History:  reports that she has never smoked. She has never used smokeless tobacco. She reports that she does not drink alcohol and does not use drugs.  Allergies: No Known Allergies  Medications: I have reviewed the patient's current medications.  Results for orders placed or performed during the hospital encounter of 06/10/20 (from the past 48 hour(s))  Resp Panel by RT-PCR (Flu A&B, Covid) Nasopharyngeal Swab     Status: None   Collection Time: 06/10/20 11:45 AM   Specimen: Nasopharyngeal Swab; Nasopharyngeal(NP) swabs in vial transport medium  Result Value Ref Range   SARS Coronavirus 2 by RT PCR NEGATIVE NEGATIVE    Comment: (NOTE) SARS-CoV-2 target nucleic acids are NOT DETECTED.  The SARS-CoV-2 RNA is generally detectable in upper respiratory specimens during the acute phase of  infection. The lowest concentration of SARS-CoV-2 viral copies this assay can detect is 138 copies/mL. A negative result does not preclude SARS-Cov-2 infection and should not be used as the sole basis for treatment or other patient management decisions. A negative result may occur with  improper specimen collection/handling, submission of specimen other than nasopharyngeal swab, presence of viral mutation(s) within the areas targeted by this assay, and inadequate number of viral copies(<138 copies/mL). A negative result must be combined with clinical observations, patient history, and epidemiological information. The expected result is Negative.  Fact Sheet for Patients:  https://www.fda.gov/media/152166/download  Fact Sheet for Healthcare Providers:  https://www.fda.gov/media/152162/download  This test is no t yet approved or cleared by the [place] FDA and  has been authorized for detection and/or diagnosis of SARS-CoV-2 by FDA under an Emergency Use Authorization (EUA). This EUA will remain  in effect (meaning this test can be used) for the duration of the COVID-19 declaration under Section 564(b)(1) of the Act, 21 U.S.C.section 360bbb-3(b)(1), unless the authorization is terminated  or revoked sooner.       Influenza A by PCR NEGATIVE NEGATIVE   Influenza B by PCR NEGATIVE NEGATIVE    Comment: (NOTE) The Xpert Xpress SARS-CoV-2/FLU/RSV plus assay is intended as an aid in the diagnosis of influenza from Nasopharyngeal swab specimens and should not be used as a sole basis for treatment. Nasal washings and aspirates are unacceptable for Xpert Xpress SARS-CoV-2/FLU/RSV testing.  Fact Sheet for Patients: https://www.fda.gov/media/152166/download  Fact Sheet for Healthcare Providers: https://www.fda.gov/media/152162/download  This test is not yet approved or cleared by the [place]   [place] FDA and has been authorized for detection and/or diagnosis of SARS-CoV-2 by FDA  under an Emergency Use Authorization (EUA). This EUA will remain in effect (meaning this test can be used) for the duration of the COVID-19 declaration under Section 564(b)(1) of the Act, 21 U.S.C. section 360bbb-3(b)(1), unless the authorization is terminated or revoked.  Performed at [facility], [address] [place], [place] [address]   Basic metabolic panel     Status: Abnormal   Collection Time: 06/10/20 12:10 PM  Result Value Ref Range   Sodium 141 135 - 145 mmol/L   Potassium 3.5 3.5 - 5.1 mmol/L   Chloride 103 98 - 111 mmol/L   CO2 28 22 - 32 mmol/L   Glucose, Bld 98 70 - 99 mg/dL    Comment: Glucose reference range applies only to samples taken after fasting for at least 8 hours.   BUN 13 8 - 23 mg/dL   Creatinine, Ser 5.53 0.44 - 1.00 mg/dL   Calcium 8.8 (L) 8.9 - 10.3 mg/dL   GFR, Estimated >74 >82 mL/min    Comment: (NOTE) Calculated using the CKD-EPI Creatinine Equation (2021)    Anion gap 10 5 - 15    Comment: Performed at [facility], [address] [place], [place] [address]  CBC WITH DIFFERENTIAL     Status: Abnormal   Collection Time: 06/10/20 12:10 PM  Result Value Ref Range   WBC 9.6 4.0 - 10.5 K/uL   RBC 4.15 3.87 - 5.11 MIL/uL   Hemoglobin 13.2 12.0 - 15.0 g/dL   HCT 75.4 49.2 - 01.0 %   MCV 98.1 80.0 - 100.0 fL   MCH 31.8 26.0 - 34.0 pg   MCHC 32.4 30.0 - 36.0 g/dL    Comment: PRE WARMING TECHNIQUE USED   RDW 13.2 11.5 - 15.5 %   Platelets 229 150 - 400 K/uL   nRBC 0.0 0.0 - 0.2 %   Neutrophils Relative % 81 %   Neutro Abs 7.9 (H) 1.7 - 7.7 K/uL   Lymphocytes Relative 10 %   Lymphs Abs 1.0 0.7 - 4.0 K/uL   Monocytes Relative 6 %   Monocytes Absolute 0.6 0.1 - 1.0 K/uL   Eosinophils Relative 1 %   Eosinophils Absolute 0.1 0.0 - 0.5 K/uL   Basophils Relative 1 %   Basophils Absolute 0.1 0.0 - 0.1 K/uL   Immature Granulocytes 1 %   Abs Immature Granulocytes 0.06 0.00 - 0.07 K/uL    Comment: Performed  at [facility], [address], [place], [place] [address]  Protime-INR     Status: None   Collection Time: 06/10/20 12:10 PM  Result Value Ref Range   Prothrombin Time 13.8 11.4 - 15.2 seconds   INR 1.1 0.8 - 1.2    Comment: (NOTE) INR goal varies based on device and disease states. Performed at [facility], [address] [place], [place] [address]   Type and screen [facility]     Status: None   Collection Time: 06/10/20 12:10 PM  Result Value Ref Range   ABO/RH(D) O POS    Antibody Screen NEG    Sample Expiration      06/13/2020,2359 Performed at [facility], [address] [place], [place] [address]     DG Hip Lucienne Capers or Wo Pelvis 2-3 Views Left  Result Date: 06/10/2020 CLINICAL DATA:  Fall. EXAM: DG HIP (WITH OR WITHOUT PELVIS)  2-3V LEFT COMPARISON:  None. FINDINGS: Acute nondisplaced left intertrochanteric femur fracture. No dislocation. Mild bilateral hip joint space narrowing with small marginal osteophytes. Osteopenia. Prior hernia repair. IMPRESSION: 1. Acute nondisplaced left intertrochanteric femur fracture. Electronically Signed   By: Obie Dredge M.D.   On: 06/10/2020 11:40    ROS negative cardiovascular, GI other than surgery as mentioned above.  Patient is active community ambulator without using any walking aids.  All other systems are negative. Blood pressure (!) 146/80, pulse 68, temperature 98.4 F (36.9 C), temperature source Oral, resp. rate 18, height 5\' 3"  (1.6 m), weight 37.2 kg, SpO2 98 %. Physical Exam HENT:     Head: Normocephalic.     Ears:     Comments: Mild hard of hearing    Nose: Nose normal.     Mouth/Throat:     Mouth: Mucous membranes are moist.  Eyes:     Extraocular Movements: Extraocular movements intact.     Pupils: Pupils are equal, round, and reactive to light.  Cardiovascular:     Rate and Rhythm: Normal rate.     Pulses: Normal pulses.   Pulmonary:     Effort: Pulmonary effort is normal.  Abdominal:     Palpations: Abdomen is soft.  Musculoskeletal:     Cervical back: Normal range of motion.  Skin:    General: Skin is warm.     Capillary Refill: Capillary refill takes less than 2 seconds.  Neurological:     General: No focal deficit present.     Mental Status: She is alert.  Psychiatric:        Mood and Affect: Mood normal.        Behavior: Behavior normal.        Thought Content: Thought content normal.        Judgment: Judgment normal.   Pain with left hip range of motion.  Assessment/Plan: X-rays demonstrate minimally displaced intertrochanteric hip fracture lesser trochanter appears to be as a separate fragment.  Plan will be trochanteric nail stabilization for intertrochanteric left hip fracture.  Risks of bleeding infection, reoperation, heart attack stroke and death all discussed.  She is at obvious increased risk at age 78 but has been physically active and ambulatory.  Procedure discussed questions were elicited and answered.  Daughter at bedside.  She requests we proceed.  110 06/10/2020, 6:59 PM

## 2020-06-10 NOTE — Op Note (Signed)
Preop diagnosis: Closed left intertrochanteric hip fracture.  Postop diagnosis: Same  Procedure: Left trochanteric nail for closed intertrochanteric hip fracture.  Surgeon: Ophelia Charter MD  Anesthesia spinal plus Marcaine skin local  EBL less than 100 cc  Implants: 11 mm Biomet affixes short nail 36 mm distal interlock screw 30mm lag screw.  Procedure: After induction of spinal anesthesia patient placed on the Hana table well leg holder, Hana boot.  Distraction internal rotation reduction C arm confirmed reduction AP lateral.  Prepping with ChloraPrep Ancef prophylaxis timeout procedure area squared with towels Betadine large shower curtain drape was applied.  Sponge clamp at the tip near the floor.  Incision was made proximal trochanter tip palpated patient was thin at age 87.  Pin was placed at the tip confirmed with x-ray drilled with the reamer overreamed and then the nail was placed without intracanal reaming.  Just the initial canal opening was used.  Nail was rotated 90 degrees with insertion and advanced until it was in good position.  Pin was drilled up initially it was too far anterior pin was backed up and then started little bit more posterior and then ended up center center in the head AP and lateral.  Measurement reaming placement of the screw and then using the lateral guide for the distal interlock screw.  Final spot pictures were taken side arm was removed compression screw been locked down proximally and the guide was removed.  Final spot pictures taken irrigation saline solution.  #1 Vicryl in the deep fascia proximal to the trochanter 2-0 Vicryl subtendinous tissue skin staple closure Marcaine infiltration for postoperative analgesia and then postop dressing with 4 x 4's ABD and tape.  Patient was transferred to care room.

## 2020-06-10 NOTE — Progress Notes (Signed)
Orthopedic Tech Progress Note Patient Details:  Alison Carpenter December 10, 1917 [id number]  Patient ID: Alison Carpenter, female   DOB: Sep 01, 1917, 87 y.o.   MRN: [id number]   Jennye Moccasin 06/10/2020, 4:17 PM

## 2020-06-10 NOTE — ED Provider Notes (Signed)
Alison Carpenter Provider Note   CSN: [id number] Arrival date & time: 06/10/20  1002     History Chief Complaint  Patient presents with  . Fall    Alison Carpenter is a 87 y.o. female who presents to ED with a chief complaint of left hip pain.  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  States that she stepped backwards and felt like she got her foot caught in the carpet.  She fell to the floor but denies any head injury, loss of consciousness.  She needed assistance to stand up and began having severe left-sided hip pain.  States that several years ago she injured her right ankle but has never had any issues with either hip before.  No prior surgeries or procedures.  She denies any headache, blurry vision, neck pain, back pain, numbness in arms or legs, anticoagulant use, pain prior to fall.  States that she does not take any medications on a daily basis and is usually ambulatory without assistance at baseline.  HPI     History reviewed. No pertinent past medical history.  There are no problems to display for this patient.   History reviewed. No pertinent surgical history.   OB History   No obstetric history on file.     History reviewed. No pertinent family history.     Home Medications Prior to Admission medications   Medication Sig Start Date End Date Taking? Authorizing Provider  aspirin EC 81 MG tablet Take 81 mg by mouth daily. Swallow whole.   Yes [provider]    Allergies    Patient has no known allergies.  Review of Systems   Review of Systems  Constitutional: Negative for appetite change, chills and fever.  HENT: Negative for ear pain, rhinorrhea, sneezing and sore throat.   Eyes: Negative for photophobia and visual disturbance.  Respiratory: Negative for cough, chest tightness, shortness of breath and wheezing.   Cardiovascular: Negative for  chest pain and palpitations.  Gastrointestinal: Negative for abdominal pain, blood in stool, constipation, diarrhea, nausea and vomiting.  Genitourinary: Negative for dysuria, hematuria and urgency.  Musculoskeletal: Positive for arthralgias. Negative for myalgias.  Skin: Negative for rash.  Neurological: Negative for dizziness, weakness and light-headedness.    Physical Exam Updated Vital Signs BP (!) 150/72   Pulse 73   Temp 97.9 F (36.6 C) (Oral)   Resp 16   SpO2 98%   Physical Exam Vitals and nursing note reviewed.  Constitutional:      General: She is not in acute distress.    Appearance: She is well-developed and well-nourished.  HENT:     Head: Normocephalic and atraumatic.     Nose: Nose normal.  Eyes:     General: No scleral icterus.       Left eye: No discharge.     Extraocular Movements: EOM normal.     Conjunctiva/sclera: Conjunctivae normal.  Cardiovascular:     Rate and Rhythm: Normal rate and regular rhythm.     Pulses: Intact distal pulses.     Heart sounds: Normal heart sounds. No murmur heard. No friction rub. No gallop.   Pulmonary:     Effort: Pulmonary effort is normal. No respiratory distress.     Breath sounds: Normal breath sounds.  Abdominal:     General: Bowel sounds are normal. There is no distension.     Palpations: Abdomen is soft.  Tenderness: There is no abdominal tenderness. There is no guarding.  Musculoskeletal:        General: Tenderness present. No edema. Normal range of motion.     Cervical back: Normal range of motion and neck supple.     Comments: Tenderness palpation of the left hip with limited range of motion secondary to pain.  2+ DP pulse noted bilaterally.  No deformities. No tenderness at the C, T or L-spine at the midline. No step-off palpated. No visible bruising, edema or temperature change noted. No objective signs of numbness present. No saddle anesthesia. Sensation intact to light touch. Strength 5/5 in bilateral  lower extremities.  Skin:    General: Skin is warm and dry.     Findings: No rash.  Neurological:     Mental Status: She is alert and oriented to person, place, and time.     Cranial Nerves: No cranial nerve deficit.     Sensory: No sensory deficit.     Motor: No weakness or abnormal muscle tone.     Coordination: Coordination normal.     Comments: Pupils reactive. No facial asymmetry noted. Cranial nerves appear grossly intact. Sensation intact to light touch on face, BUE and BLE.   Psychiatric:        Mood and Affect: Mood and affect normal.     ED Results / Procedures / Treatments   Labs (all labs ordered are listed, but only abnormal results are displayed) Labs Reviewed  BASIC METABOLIC PANEL - Abnormal; Notable for the following components:      Result Value   Calcium 8.8 (*)    All other components within normal limits  CBC WITH DIFFERENTIAL/PLATELET - Abnormal; Notable for the following components:   Neutro Abs 7.9 (*)    All other components within normal limits  RESP PANEL BY RT-PCR (FLU A&B, COVID) [id number]  PROTIME-INR  TYPE AND SCREEN  ABO/RH    EKG None  Radiology DG Hip Unilat W or Wo Pelvis 2-3 Views Left  Result Date: 06/10/2020 CLINICAL DATA:  Fall. EXAM: DG HIP (WITH OR WITHOUT PELVIS) 2-3V LEFT COMPARISON:  None. FINDINGS: Acute nondisplaced left intertrochanteric femur fracture. No dislocation. Mild bilateral hip joint space narrowing with small marginal osteophytes. Osteopenia. Prior hernia repair. IMPRESSION: 1. Acute nondisplaced left intertrochanteric femur fracture. Electronically Signed   By: Obie Dredge M.D.   On: 06/10/2020 11:40    Procedures Procedures   Medications Ordered in ED Medications  acetaminophen (TYLENOL) tablet 650 mg (650 mg Oral Given 06/10/20 1031)  fentaNYL (SUBLIMAZE) injection 12.5 mcg (12.5 mcg Intravenous Given 06/10/20 1312)    ED Course  I have reviewed the triage vital signs and the nursing notes.  Pertinent  labs & imaging results that were available during my care of the patient were reviewed by me and considered in my medical decision making (see chart for details).    MDM Rules/Calculators/A&P                          87 year old female presenting to the ED with a chief complaint of left hip pain.  States that she was at work when her assistant was showing her something on her computer which caused her to step backwards.  She felt like her foot got caught in the carpet and caused her to fall to the floor.  Denies any head injury or loss of consciousness.  Needed assistance getting up and has been having severe left-sided  hip pain since then.  No prior hip fractures or dislocations or any other procedures.  Denies any headache, pain prior to the incident, vision changes, vomiting, back pain, neck pain, numbness in arms or legs or anticoagulant use.  She is amatory at baseline without assistance.  On exam there is tenderness of the left hip with limited range of motion secondary to pain.  No C, T or L-spine tenderness at the midline paraspinal musculature.  No bruising noted.  No chest tenderness.  No neurological deficits, she is alert and oriented x3.  X-ray shows left intertrochanteric femur fracture.  Spoke to Dr. Ophelia Charter, on-call for orthopedist who recommends n.p.o. for likely procedure this evening. Her medical screening lab work including CBC, BMP are unremarkable.  Her Covid test is negative.  Her EKG without any ischemic changes. Will admit to hospital service.  All imaging, if done today, including plain films, CT scans, and ultrasounds, independently reviewed by me, and interpretations confirmed via formal radiology reads.  Portions of this note were generated with Scientist, clinical (histocompatibility and immunogenetics). Dictation errors may occur despite best attempts at proofreading.  Final Clinical Impression(s) / ED Diagnoses Final diagnoses:  Closed nondisplaced intertrochanteric fracture of left femur, initial  encounter [facility])    Rx / DC Orders ED Discharge Orders    None       Dietrich Pates, PA-C 06/10/20 1316    Alvira Monday, MD 06/11/20 [phone]

## 2020-06-10 NOTE — Anesthesia Preprocedure Evaluation (Addendum)
Anesthesia Evaluation  Patient identified by MRN, date of birth, ID band Patient awake    Reviewed: Allergy & Precautions, H&P , NPO status , Patient's Chart, lab work & pertinent test results, reviewed documented beta blocker date and time   Airway Mallampati: II  TM Distance: >3 FB Neck ROM: full    Dental no notable dental hx. (+) Teeth Intact, Dental Advisory Given, Missing, Partial Upper,    Pulmonary neg pulmonary ROS,    Pulmonary exam normal breath sounds clear to auscultation       Cardiovascular Exercise Tolerance: Good negative cardio ROS   Rhythm:regular Rate:Normal     Neuro/Psych negative neurological ROS  negative psych ROS   GI/Hepatic negative GI ROS, Neg liver ROS,   Endo/Other  negative endocrine ROS  Renal/GU negative Renal ROS  negative genitourinary   Musculoskeletal   Abdominal   Peds  Hematology negative hematology ROS (+)   Anesthesia Other Findings   Reproductive/Obstetrics negative OB ROS                            Anesthesia Physical Anesthesia Plan  ASA: III and emergent  Anesthesia Plan: Spinal   Post-op Pain Management:    Induction:   PONV Risk Score and Plan: 3  Airway Management Planned: Natural Airway and Nasal Cannula  Additional Equipment:   Intra-op Plan:   Post-operative Plan:   Informed Consent: I have reviewed the patients History and Physical, chart, labs and discussed the procedure including the risks, benefits and alternatives for the proposed anesthesia with the patient or authorized representative who has indicated his/her understanding and acceptance.     Dental Advisory Given  Plan Discussed with: CRNA and Anesthesiologist  Anesthesia Plan Comments:        Anesthesia Quick Evaluation

## 2020-06-10 NOTE — Interval H&P Note (Signed)
History and Physical Interval Note:  06/10/2020 7:14 PM  Alison Carpenter  has presented today for surgery, with the diagnosis of LEFT INTERTROCH NAIL.  The various methods of treatment have been discussed with the patient and family. After consideration of risks, benefits and other options for treatment, the patient has consented to  Procedure(s): INTRAMEDULLARY (IM) NAIL FEMORAL (Left) as a surgical intervention.  The patient's history has been reviewed, patient examined, no change in status, stable for surgery.  I have reviewed the patient's chart and labs.  Questions were answered to the patient's satisfaction.     Eldred Manges

## 2020-06-10 NOTE — Progress Notes (Signed)
Left IT hip fx without angulation , minimal displacement. Will post for surgery as add on today. OR states about 7 PM likely time.  Will see pt after office to discuss surgery. Order placed for consent and she can wait to sign it after I talk with her.

## 2020-06-10 NOTE — H&P (Signed)
History and Physical    Alison Carpenter EFE:[id number] DOB: 1917/12/29 DOA: 06/10/2020  PCP: Burton Apley, MD  Patient coming from: Home  Chief Complaint: Fall  HPI: Alison Carpenter is a 87 y.o. female with no significant medical history. She is presenting with hip pain after fall. She reports that she went to work this morning. She was having some problems with some equipment at her desk. She stood up from her desk and stumbled backwards as her shoe got caught in the carpet. She fell to her left side. She denies any head injury or LOC. She remembers the entire fall. She tried to get up and was only partially able to make it before she was assisted to a chair. She found that it was painful to put pressure on her left side, so she was transported to the ED. She denies any or aggravating or alleviating factors.   ED Course: XR showed left hip fracture. Ortho was consulted. They will try to fix her in surgery schedule this evening. [facility] was called for admission.   Review of Systems:  She denies CP, palpitations, dyspnea, N/V/D/F, lightheadedness, dizziness, visual or auditory changes, LOC. Reports hip pain. Family reports that she had one other fall in the last month that did not result in injury Review of systems is otherwise negative for all not mentioned in HPI.   PMHx History reviewed. No pertinent past medical history.  PSHx Appendectomy Hysterectomy Partial colon resection Cholecystectomy  SocHx Denies tobacco, EtOH, illicit Rx usage  No Known Allergies  FamHx History reviewed. No pertinent family history.  Prior to Admission medications   Medication Sig Start Date End Date Taking? Authorizing Provider  aspirin EC 81 MG tablet Take 81 mg by mouth daily. Swallow whole.   Yes [provider]    Physical Exam: Vitals:   06/10/20 1030 06/10/20 1200 06/10/20 1300 06/10/20 1315  BP: (!) 161/90 (!) 150/72 127/88   Pulse: 79 73 75 67  Resp: 17 16 17 17    Temp:      TempSrc:      SpO2: 99% 98% 100% 97%    General: 87 y.o. female resting in bed in NAD Eyes: PERRL, normal sclera ENMT: Nares patent w/o discharge, orophaynx clear, dentition normal, ears w/o discharge/lesions/ulcers Neck: Supple, trachea midline Cardiovascular: RRR, +S1, S2, no m/g/r, equal pulses throughout Respiratory: CTABL, no w/r/r, normal WOB GI: BS+, NDNT, no masses noted, no organomegaly noted MSK: No e/c/c; left hip pain Skin: No rashes, bruises, ulcerations noted Neuro: A&O x 3, hard of hearing, otherwise no focality on exam Psyc: Appropriate interaction and affect, calm/cooperative  Labs on Admission: I have personally reviewed following labs and imaging studies  CBC: Recent Labs  Lab 06/10/20 1210  WBC 9.6  NEUTROABS 7.9*  HGB 13.2  HCT 40.7  MCV 98.1  PLT 229   Basic Metabolic Panel: Recent Labs  Lab 06/10/20 1210  NA 141  K 3.5  CL 103  CO2 28  GLUCOSE 98  BUN 13  CREATININE 0.58  CALCIUM 8.8*   GFR: CrCl cannot be calculated (Unknown ideal weight.). Liver Function Tests: No results for input(s): AST, ALT, ALKPHOS, BILITOT, PROT, ALBUMIN in the last 168 hours. No results for input(s): LIPASE, AMYLASE in the last 168 hours. No results for input(s): AMMONIA in the last 168 hours. Coagulation Profile: Recent Labs  Lab 06/10/20 1210  INR 1.1   Cardiac Enzymes: No results for input(s): CKTOTAL, CKMB, CKMBINDEX, TROPONINI in the last 168 hours.  BNP (last 3 results) No results for input(s): PROBNP in the last 8760 hours. HbA1C: No results for input(s): HGBA1C in the last 72 hours. CBG: No results for input(s): GLUCAP in the last 168 hours. Lipid Profile: No results for input(s): CHOL, HDL, LDLCALC, TRIG, CHOLHDL, LDLDIRECT in the last 72 hours. Thyroid Function Tests: No results for input(s): TSH, T4TOTAL, FREET4, T3FREE, THYROIDAB in the last 72 hours. Anemia Panel: No results for input(s): VITAMINB12, FOLATE, FERRITIN, TIBC,  IRON, RETICCTPCT in the last 72 hours. Urine analysis: No results found for: COLORURINE, APPEARANCEUR, LABSPEC, PHURINE, GLUCOSEU, HGBUR, BILIRUBINUR, KETONESUR, PROTEINUR, UROBILINOGEN, NITRITE, LEUKOCYTESUR  Radiological Exams on Admission: DG Hip Unilat W or Wo Pelvis 2-3 Views Left  Result Date: 06/10/2020 CLINICAL DATA:  Fall. EXAM: DG HIP (WITH OR WITHOUT PELVIS) 2-3V LEFT COMPARISON:  None. FINDINGS: Acute nondisplaced left intertrochanteric femur fracture. No dislocation. Mild bilateral hip joint space narrowing with small marginal osteophytes. Osteopenia. Prior hernia repair. IMPRESSION: 1. Acute nondisplaced left intertrochanteric femur fracture. Electronically Signed   By: Obie Dredge M.D.   On: 06/10/2020 11:40    EKG: Independently reviewed. Sinus, no st changes  Assessment/Plan Left hip fracture Falls     - admit to inpt, med-surg     - ortho to take to surgery; keep NPO for now     - pain control     - PT/OT after surgery  DVT prophylaxis: lovenox  Code Status: FULL  Family Communication: with dtr at bedside  Consults called: EDP spoke with ortho   Status is: Inpatient  Remains inpatient appropriate because:Inpatient level of care appropriate due to severity of illness   Dispo: The patient is from: Home              Anticipated d/c is to: SNF              Anticipated d/c date is: 2 days              Patient currently is not medically stable to d/c.   Difficult to place patient No  Teddy Spike DO [facility]  If 7PM-7AM, please contact night-coverage [email or web address]  06/10/2020, 1:30 PM

## 2020-06-10 NOTE — H&P (View-Only) (Signed)
Reason for Consult:fall with left closed IT hip fracture Referring Physician: Margie Ege, DO  Alison Carpenter is an 87 y.o. female.  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  She stumbled backwards on her shoe caught on the carpet suffering acute left hip pain and inability to ambulate.  She assisted to a chair eventually was brought to the emergency room where x-rays demonstrated a minimally displaced intertrochanteric hip fracture.  Patient had previous surgeries include hysterectomy partial colon resection, cholecystectomy and appendectomy without problems.  She does not smoke drink or do drugs.  Patient is alert cooperative and talkative.  Her daughter is at the bedside.  History reviewed. No pertinent past medical history.  Past Surgical History:  Procedure Laterality Date  . ABDOMINAL HYSTERECTOMY    . APPENDECTOMY    . CATARACT EXTRACTION, BILATERAL    . CHOLECYSTECTOMY      History reviewed. No pertinent family history.  Social History:  reports that she has never smoked. She has never used smokeless tobacco. She reports that she does not drink alcohol and does not use drugs.  Allergies: No Known Allergies  Medications: I have reviewed the patient's current medications.  Results for orders placed or performed during the hospital encounter of 06/10/20 (from the past 48 hour(s))  Resp Panel by RT-PCR (Flu A&B, Covid) Nasopharyngeal Swab     Status: None   Collection Time: 06/10/20 11:45 AM   Specimen: Nasopharyngeal Swab; Nasopharyngeal(NP) swabs in vial transport medium  Result Value Ref Range   SARS Coronavirus 2 by RT PCR NEGATIVE NEGATIVE    Comment: (NOTE) SARS-CoV-2 target nucleic acids are NOT DETECTED.  The SARS-CoV-2 RNA is generally detectable in upper respiratory specimens during the acute phase of  infection. The lowest concentration of SARS-CoV-2 viral copies this assay can detect is 138 copies/mL. A negative result does not preclude SARS-Cov-2 infection and should not be used as the sole basis for treatment or other patient management decisions. A negative result may occur with  improper specimen collection/handling, submission of specimen other than nasopharyngeal swab, presence of viral mutation(s) within the areas targeted by this assay, and inadequate number of viral copies(<138 copies/mL). A negative result must be combined with clinical observations, patient history, and epidemiological information. The expected result is Negative.  Fact Sheet for Patients:  BloggerCourse.com  Fact Sheet for Healthcare Providers:  SeriousBroker.it  This test is no t yet approved or cleared by the [place] FDA and  has been authorized for detection and/or diagnosis of SARS-CoV-2 by FDA under an Emergency Use Authorization (EUA). This EUA will remain  in effect (meaning this test can be used) for the duration of the COVID-19 declaration under Section 564(b)(1) of the Act, 21 U.S.C.section 360bbb-3(b)(1), unless the authorization is terminated  or revoked sooner.       Influenza A by PCR NEGATIVE NEGATIVE   Influenza B by PCR NEGATIVE NEGATIVE    Comment: (NOTE) The Xpert Xpress SARS-CoV-2/FLU/RSV plus assay is intended as an aid in the diagnosis of influenza from Nasopharyngeal swab specimens and should not be used as a sole basis for treatment. Nasal washings and aspirates are unacceptable for Xpert Xpress SARS-CoV-2/FLU/RSV testing.  Fact Sheet for Patients: BloggerCourse.com  Fact Sheet for Healthcare Providers: SeriousBroker.it  This test is not yet approved or cleared by the [place] FDA and has been authorized for detection and/or diagnosis of SARS-CoV-2 by FDA  under an Emergency Use Authorization (EUA). This EUA will remain in effect (meaning this test can be used) for the duration of the COVID-19 declaration under Section 564(b)(1) of the Act, 21 U.S.C. section 360bbb-3(b)(1), unless the authorization is terminated or revoked.  Performed at [facility], [address] [place], [place] [address]   Basic metabolic panel     Status: Abnormal   Collection Time: 06/10/20 12:10 PM  Result Value Ref Range   Sodium 141 135 - 145 mmol/L   Potassium 3.5 3.5 - 5.1 mmol/L   Chloride 103 98 - 111 mmol/L   CO2 28 22 - 32 mmol/L   Glucose, Bld 98 70 - 99 mg/dL    Comment: Glucose reference range applies only to samples taken after fasting for at least 8 hours.   BUN 13 8 - 23 mg/dL   Creatinine, Ser 5.53 0.44 - 1.00 mg/dL   Calcium 8.8 (L) 8.9 - 10.3 mg/dL   GFR, Estimated >74 >82 mL/min    Comment: (NOTE) Calculated using the CKD-EPI Creatinine Equation (2021)    Anion gap 10 5 - 15    Comment: Performed at [facility], [address] [place], [place] [address]  CBC WITH DIFFERENTIAL     Status: Abnormal   Collection Time: 06/10/20 12:10 PM  Result Value Ref Range   WBC 9.6 4.0 - 10.5 K/uL   RBC 4.15 3.87 - 5.11 MIL/uL   Hemoglobin 13.2 12.0 - 15.0 g/dL   HCT 75.4 49.2 - 01.0 %   MCV 98.1 80.0 - 100.0 fL   MCH 31.8 26.0 - 34.0 pg   MCHC 32.4 30.0 - 36.0 g/dL    Comment: PRE WARMING TECHNIQUE USED   RDW 13.2 11.5 - 15.5 %   Platelets 229 150 - 400 K/uL   nRBC 0.0 0.0 - 0.2 %   Neutrophils Relative % 81 %   Neutro Abs 7.9 (H) 1.7 - 7.7 K/uL   Lymphocytes Relative 10 %   Lymphs Abs 1.0 0.7 - 4.0 K/uL   Monocytes Relative 6 %   Monocytes Absolute 0.6 0.1 - 1.0 K/uL   Eosinophils Relative 1 %   Eosinophils Absolute 0.1 0.0 - 0.5 K/uL   Basophils Relative 1 %   Basophils Absolute 0.1 0.0 - 0.1 K/uL   Immature Granulocytes 1 %   Abs Immature Granulocytes 0.06 0.00 - 0.07 K/uL    Comment: Performed  at [facility], [address], [place], [place] [address]  Protime-INR     Status: None   Collection Time: 06/10/20 12:10 PM  Result Value Ref Range   Prothrombin Time 13.8 11.4 - 15.2 seconds   INR 1.1 0.8 - 1.2    Comment: (NOTE) INR goal varies based on device and disease states. Performed at [facility], [address] [place], [place] [address]   Type and screen [facility]     Status: None   Collection Time: 06/10/20 12:10 PM  Result Value Ref Range   ABO/RH(D) O POS    Antibody Screen NEG    Sample Expiration      06/13/2020,2359 Performed at [facility], [address] [place], [place] [address]     DG Hip Lucienne Capers or Wo Pelvis 2-3 Views Left  Result Date: 06/10/2020 CLINICAL DATA:  Fall. EXAM: DG HIP (WITH OR WITHOUT PELVIS)  2-3V LEFT COMPARISON:  None. FINDINGS: Acute nondisplaced left intertrochanteric femur fracture. No dislocation. Mild bilateral hip joint space narrowing with small marginal osteophytes. Osteopenia. Prior hernia repair. IMPRESSION: 1. Acute nondisplaced left intertrochanteric femur fracture. Electronically Signed   By: Obie Dredge M.D.   On: 06/10/2020 11:40    ROS negative cardiovascular, GI other than surgery as mentioned above.  Patient is active community ambulator without using any walking aids.  All other systems are negative. Blood pressure (!) 146/80, pulse 68, temperature 98.4 F (36.9 C), temperature source Oral, resp. rate 18, height 5\' 3"  (1.6 m), weight 37.2 kg, SpO2 98 %. Physical Exam HENT:     Head: Normocephalic.     Ears:     Comments: Mild hard of hearing    Nose: Nose normal.     Mouth/Throat:     Mouth: Mucous membranes are moist.  Eyes:     Extraocular Movements: Extraocular movements intact.     Pupils: Pupils are equal, round, and reactive to light.  Cardiovascular:     Rate and Rhythm: Normal rate.     Pulses: Normal pulses.   Pulmonary:     Effort: Pulmonary effort is normal.  Abdominal:     Palpations: Abdomen is soft.  Musculoskeletal:     Cervical back: Normal range of motion.  Skin:    General: Skin is warm.     Capillary Refill: Capillary refill takes less than 2 seconds.  Neurological:     General: No focal deficit present.     Mental Status: She is alert.  Psychiatric:        Mood and Affect: Mood normal.        Behavior: Behavior normal.        Thought Content: Thought content normal.        Judgment: Judgment normal.   Pain with left hip range of motion.  Assessment/Plan: X-rays demonstrate minimally displaced intertrochanteric hip fracture lesser trochanter appears to be as a separate fragment.  Plan will be trochanteric nail stabilization for intertrochanteric left hip fracture.  Risks of bleeding infection, reoperation, heart attack stroke and death all discussed.  She is at obvious increased risk at age 78 but has been physically active and ambulatory.  Procedure discussed questions were elicited and answered.  Daughter at bedside.  She requests we proceed.  110 06/10/2020, 6:59 PM

## 2020-06-10 NOTE — Transfer of Care (Signed)
Immediate Anesthesia Transfer of Care Note  Patient: Alison Carpenter  Procedure(s) Performed: INTRAMEDULLARY (IM) NAIL FEMORAL (Left Hip)  Patient Location: PACU  Anesthesia Type:Spinal  Level of Consciousness: awake, alert  and oriented  Airway & Oxygen Therapy: Patient Spontanous Breathing and Patient connected to face mask oxygen  Post-op Assessment: Report given to RN and Post -op Vital signs reviewed and stable  Post vital signs: Reviewed and stable  Last Vitals:  Vitals Value Taken Time  BP 97/66 06/10/20 2036  Temp    Pulse 96 06/10/20 2038  Resp 18 06/10/20 2038  SpO2 100 % 06/10/20 2038  Vitals shown include unvalidated device data.  Last Pain:  Vitals:   06/10/20 1850  TempSrc: Oral  PainSc:          Complications: No complications documented.

## 2020-06-10 NOTE — ED Notes (Signed)
Purewick placed on pt. 

## 2020-06-10 NOTE — Anesthesia Procedure Notes (Signed)
Spinal  Patient location during procedure: OR Start time: 06/10/2020 7:39 PM End time: 06/10/2020 8:43 PM Staffing Anesthesiologist: Bethena Midget, MD Preanesthetic Checklist Completed: patient identified, IV checked, site marked, risks and benefits discussed, surgical consent, monitors and equipment checked, pre-op evaluation and timeout performed Spinal Block Patient position: sitting Prep: DuraPrep Patient monitoring: heart rate, cardiac monitor, continuous pulse ox and blood pressure Approach: midline Location: L3-4 Injection technique: single-shot Needle Needle type: Sprotte  Needle gauge: 24 G Needle length: 9 cm Assessment Sensory level: T4

## 2020-06-11 ENCOUNTER — Encounter (HOSPITAL_COMMUNITY): Payer: Self-pay | Admitting: Orthopaedic Surgery

## 2020-06-11 DIAGNOSIS — R636 Underweight: Secondary | ICD-10-CM

## 2020-06-11 DIAGNOSIS — W19XXXA Unspecified fall, initial encounter: Secondary | ICD-10-CM | POA: Diagnosis not present

## 2020-06-11 DIAGNOSIS — S72145A Nondisplaced intertrochanteric fracture of left femur, initial encounter for closed fracture: Secondary | ICD-10-CM | POA: Diagnosis not present

## 2020-06-11 LAB — CBC
HCT: 36.2 % (ref 36.0–46.0)
Hemoglobin: 11.7 g/dL — ABNORMAL LOW (ref 12.0–15.0)
MCH: 32.1 pg (ref 26.0–34.0)
MCHC: 32.3 g/dL (ref 30.0–36.0)
MCV: 99.2 fL (ref 80.0–100.0)
Platelets: 195 10*3/uL (ref 150–400)
RBC: 3.65 MIL/uL — ABNORMAL LOW (ref 3.87–5.11)
RDW: 13.1 % (ref 11.5–15.5)
WBC: 13.7 10*3/uL — ABNORMAL HIGH (ref 4.0–10.5)
nRBC: 0 % (ref 0.0–0.2)

## 2020-06-11 LAB — BASIC METABOLIC PANEL
Anion gap: 10 (ref 5–15)
BUN: 12 mg/dL (ref 8–23)
CO2: 25 mmol/L (ref 22–32)
Calcium: 8 mg/dL — ABNORMAL LOW (ref 8.9–10.3)
Chloride: 103 mmol/L (ref 98–111)
Creatinine, Ser: 0.55 mg/dL (ref 0.44–1.00)
GFR, Estimated: >60 mL/min (ref >60)
Glucose, Bld: 123 mg/dL — ABNORMAL HIGH (ref 70–99)
Potassium: 3.5 mmol/L (ref 3.5–5.1)
Sodium: 138 mmol/L (ref 135–145)

## 2020-06-11 MED ORDER — HYDROCODONE-ACETAMINOPHEN 5-325 MG PO TABS: 1.0000 | ORAL_TABLET | Freq: Four times a day (QID) | ORAL | 0 refills | Status: DC | PRN

## 2020-06-11 MED ORDER — TRAMADOL HCL 50 MG PO TABS: 50.0000 mg | ORAL_TABLET | Freq: Four times a day (QID) | ORAL | Status: DC | PRN

## 2020-06-11 MED ORDER — ENSURE SURGERY PO LIQD
237.0000 mL | ORAL | Status: DC
  Administered 2020-06-12 – 2020-06-14 (×3): 237 mL via ORAL

## 2020-06-11 MED ORDER — ENOXAPARIN SODIUM 30 MG/0.3ML ~~LOC~~ SOLN: 30.0000 mg | SUBCUTANEOUS | 0 refills | Status: DC

## 2020-06-11 NOTE — Progress Notes (Signed)
PROGRESS NOTE  Alison Carpenter ZES:[id number] DOB: April 08, 1918   PCP: Burton Apley, MD  Patient is from: Home  DOA: 06/10/2020 LOS: 1  Chief complaints: Accidental fall and left hip pain  Brief Narrative / Interim history: 87 year old F with no significant PMH brought to ED due to left hip pain after accidental fall at her place of work, and admitted for left hip fracture.  She underwent left trochanteric nail by Dr. Ophelia Charter on 2/17.  Subjective: Seen and examined earlier this morning.  No major events overnight of this morning.  She reports "mild" left hip pain after pain medication.  No other complaints.  She denies chest pain, dyspnea, GI or UTI symptoms.  Objective: Vitals:   06/10/20 2347 06/11/20 0054 06/11/20 0500 06/11/20 0949  BP: 125/80 125/83 (!) 96/57 115/74  Pulse: 93 (!) 103 91 83  Resp: 16 17 16 16   Temp: 97.7 F (36.5 C) 97.8 F (36.6 C) 97.7 F (36.5 C) (!) 97.5 F (36.4 C)  TempSrc:  Axillary Axillary Oral  SpO2: 97% 98% 98% 100%  Weight:      Height:        Intake/Output Summary (Last 24 hours) at 06/11/2020 1236 Last data filed at 06/11/2020 1010 Gross per 24 hour  Intake 1407.35 ml  Output 950 ml  Net 457.35 ml   Filed Weights   06/10/20 1624  Weight: 37.2 kg    Examination:  GENERAL: No apparent distress.  Nontoxic. HEENT: MMM.  Vision and hearing grossly intact.  NECK: Supple.  No apparent JVD.  RESP:  No IWOB.  Fair aeration bilaterally. CVS:  RRR. Heart sounds normal.  ABD/GI/GU: BS+. Abd soft, NTND.  MSK/EXT:  Moves extremities. No apparent deformity. No edema.  SKIN: Dressing over left hip and DCI. NEURO: Awake, alert and oriented appropriately.  No apparent focal neuro deficit. PSYCH: Calm. Normal affect.  Procedures:  2/17-left trochanteric nailing for closed intertrochanteric hip fracture  Microbiology summarized: COVID-19 PCR nonreactive.  Assessment & Plan: Accidental fall at place of work Closed intertrochanteric  left femoral fracture -S/p trochanteric nail -Pain control and DVT prophylaxis per orthopedic surgery -PT/OT   Underweight Body mass index is 14.53 kg/m.    -Consult dietitian       DVT prophylaxis:  enoxaparin (LOVENOX) injection 30 mg Start: 06/11/20 0800 SCDs Start: 06/10/20 2241  Code Status: Full code Family Communication: Patient and/or RN. Available if any question.  Level of care: Med-Surg Status is: Inpatient  Remains inpatient appropriate because:Unsafe d/c plan   Dispo: The patient is from: Home              Anticipated d/c is to: To be determined              Anticipated d/c date is: 2 days              Patient currently is not medically stable to d/c.   Difficult to place patient No       Consultants:  Orthopedic surgery   Sch Meds:  Scheduled Meds: . acetaminophen  500 mg Oral Q6H  . aspirin EC  81 mg Oral Daily  . docusate sodium  100 mg Oral BID  . enoxaparin (LOVENOX) injection  30 mg Subcutaneous Q24H   Continuous Infusions: . sodium chloride 75 mL/hr at 06/10/20 2313   PRN Meds:.acetaminophen, HYDROcodone-acetaminophen, menthol-cetylpyridinium **OR** phenol, metoCLOPramide **OR** metoCLOPramide (REGLAN) injection, ondansetron **OR** ondansetron (ZOFRAN) IV, traMADol  Antimicrobials: Anti-infectives (From admission, onward)   Start  Dose/Rate Route Frequency Ordered Stop   06/11/20 0600  ceFAZolin (ANCEF) IVPB 2g/100 mL premix        2 g 200 mL/hr over 30 Minutes Intravenous On call to O.R. 06/10/20 1958 06/10/20 1946       I have personally reviewed the following labs and images: CBC: Recent Labs  Lab 06/10/20 1210 06/11/20 0312  WBC 9.6 13.7*  NEUTROABS 7.9*  --   HGB 13.2 11.7*  HCT 40.7 36.2  MCV 98.1 99.2  PLT 229 195   BMP &GFR Recent Labs  Lab 06/10/20 1210 06/11/20 0312  NA 141 138  K 3.5 3.5  CL 103 103  CO2 28 25  GLUCOSE 98 123*  BUN 13 12  CREATININE 0.58 0.55  CALCIUM 8.8* 8.0*   Estimated  Creatinine Clearance: 22 mL/min (by C-G formula based on SCr of 0.55 mg/dL). Liver & Pancreas: No results for input(s): AST, ALT, ALKPHOS, BILITOT, PROT, ALBUMIN in the last 168 hours. No results for input(s): LIPASE, AMYLASE in the last 168 hours. No results for input(s): AMMONIA in the last 168 hours. Diabetic: No results for input(s): HGBA1C in the last 72 hours. No results for input(s): GLUCAP in the last 168 hours. Cardiac Enzymes: No results for input(s): CKTOTAL, CKMB, CKMBINDEX, TROPONINI in the last 168 hours. No results for input(s): PROBNP in the last 8760 hours. Coagulation Profile: Recent Labs  Lab 06/10/20 1210  INR 1.1   Thyroid Function Tests: No results for input(s): TSH, T4TOTAL, FREET4, T3FREE, THYROIDAB in the last 72 hours. Lipid Profile: No results for input(s): CHOL, HDL, LDLCALC, TRIG, CHOLHDL, LDLDIRECT in the last 72 hours. Anemia Panel: No results for input(s): VITAMINB12, FOLATE, FERRITIN, TIBC, IRON, RETICCTPCT in the last 72 hours. Urine analysis: No results found for: COLORURINE, APPEARANCEUR, LABSPEC, PHURINE, GLUCOSEU, HGBUR, BILIRUBINUR, KETONESUR, PROTEINUR, UROBILINOGEN, NITRITE, LEUKOCYTESUR Sepsis Labs: Invalid input(s): PROCALCITONIN, LACTICIDVEN  Microbiology: Recent Results (from the past 240 hour(s))  Resp Panel by RT-PCR (Flu A&B, Covid) Nasopharyngeal Swab     Status: None   Collection Time: 06/10/20 11:45 AM   Specimen: Nasopharyngeal Swab; Nasopharyngeal(NP) swabs in vial transport medium  Result Value Ref Range Status   SARS Coronavirus 2 by RT PCR NEGATIVE NEGATIVE Final    Comment: (NOTE) SARS-CoV-2 target nucleic acids are NOT DETECTED.  The SARS-CoV-2 RNA is generally detectable in upper respiratory specimens during the acute phase of infection. The lowest concentration of SARS-CoV-2 viral copies this assay can detect is 138 copies/mL. A negative result does not preclude SARS-Cov-2 infection and should not be used as the  sole basis for treatment or other patient management decisions. A negative result may occur with  improper specimen collection/handling, submission of specimen other than nasopharyngeal swab, presence of viral mutation(s) within the areas targeted by this assay, and inadequate number of viral copies(<138 copies/mL). A negative result must be combined with clinical observations, patient history, and epidemiological information. The expected result is Negative.  Fact Sheet for Patients:  BloggerCourse.com  Fact Sheet for Healthcare Providers:  SeriousBroker.it  This test is no t yet approved or cleared by the [place] FDA and  has been authorized for detection and/or diagnosis of SARS-CoV-2 by FDA under an Emergency Use Authorization (EUA). This EUA will remain  in effect (meaning this test can be used) for the duration of the COVID-19 declaration under Section 564(b)(1) of the Act, 21 U.S.C.section 360bbb-3(b)(1), unless the authorization is terminated  or revoked sooner.       Influenza A  by PCR NEGATIVE NEGATIVE Final   Influenza B by PCR NEGATIVE NEGATIVE Final    Comment: (NOTE) The Xpert Xpress SARS-CoV-2/FLU/RSV plus assay is intended as an aid in the diagnosis of influenza from Nasopharyngeal swab specimens and should not be used as a sole basis for treatment. Nasal washings and aspirates are unacceptable for Xpert Xpress SARS-CoV-2/FLU/RSV testing.  Fact Sheet for Patients: BloggerCourse.com  Fact Sheet for Healthcare Providers: SeriousBroker.it  This test is not yet approved or cleared by the [place] FDA and has been authorized for detection and/or diagnosis of SARS-CoV-2 by FDA under an Emergency Use Authorization (EUA). This EUA will remain in effect (meaning this test can be used) for the duration of the COVID-19 declaration under Section 564(b)(1) of the  Act, 21 U.S.C. section 360bbb-3(b)(1), unless the authorization is terminated or revoked.  Performed at [facility], [address], [place], [place] [address]     Radiology Studies: DG C-Arm 1-60 Min-No Report  Result Date: 06/10/2020 Fluoroscopy was utilized by the requesting physician.  No radiographic interpretation.   DG FEMUR MIN 2 VIEWS LEFT  Result Date: 06/10/2020 CLINICAL DATA:  Left femoral fracture repair EXAM: LEFT FEMUR 2 VIEWS COMPARISON:  06/10/2020 FINDINGS: Three fluoroscopic images are obtained during the performance of the procedure and are provided for interpretation only. Intramedullary rod with proximal dynamic screw and distal interlocking screw traverses the intertrochanteric left hip fracture seen previously. Alignment is anatomic. Please refer to the operative report. IMPRESSION: 1. ORIF of an intertrochanteric left hip fracture with near anatomic alignment. Electronically Signed   By: Sharlet Salina M.D.   On: 06/10/2020 21:31      Taye [facility]  If 7PM-7AM, please contact night-coverage [email or web address] 06/11/2020, 12:36 PM

## 2020-06-11 NOTE — Plan of Care (Signed)
Plan of care initiated.

## 2020-06-11 NOTE — TOC Initial Note (Addendum)
Transition of Care [facility]) - Initial/Assessment Note    Patient Details  Name: Alison Carpenter MRN: [id number] Date of Birth: 05-06-1917  Transition of Care [facility]) CM/SW Contact:    Lia Hopping, [place] Phone Number: 06/11/2020, 11:02 AM  Clinical Narrative:    Patient admitted after a fall while at work. Patient suffered an acute left fracture. Patient underwent surgical intervention.               CSW met with the patient and her daughter Alison Carpenter at bedside to discus disposition. Patient alert x4 however upon arrival was having nausea and had just received pain medications. CSW spoke with her daughter. Daughter reports the patient is independent still works as a Conservation officer, historic buildings. Daughter reports the patient fell while working. She reports the patient is the caregiver for her other daughter with dementia who lives with patient. Daughter reports the patient wishes to discharge home with home health services. Daughter reports she plans to stay with the patient and has also hired caregivers for the patient and to assist with the care of her daughter with dementia. Daughter gave CSW permission to arrange home health for PT/OT/nurse aide. CSW offered choice.  CSW made a referral to [facility] staff Ramond Marrow.  Daughter reports the patient will need a RW. She has modified shower with a bench that can accommodate a wheelchair. She has a commode with accessible grab bars.  CSW made a referral to [facility].    Expected Discharge Plan: [place] Barriers to Discharge: Continued Medical Work up   Patient Goals and CMS Choice   CMS Medicare.gov Compare Post Acute Care list provided to:: Patient Choice offered to / list presented to : [place]  Expected Discharge Plan and Services Expected Discharge Plan: [place] In-house Referral: Clinical Social Work Discharge Planning Services: CM Consult Post  Acute Care Choice: [place] arrangements for the past 2 months: Single Family Home                 DME Arranged: Walker rolling DME Agency: [employer]       HH Arranged: PT,OT,Nurse's Aide [place] Agency: [facility] ([place]) Date HH Agency Contacted: 06/11/20 Time HH Agency Contacted: 73 Representative spoke with at [facility]: Ramond Marrow  Prior Living Arrangements/Services Living arrangements for the past 2 months: [place] with:: Adult Children (She cares for her daughter with Dementia.) Patient language and need for interpreter reviewed:: No Do you feel safe going back to the place where you live?: Yes      Need for Family Participation in Patient Care: Yes (Comment) Care giver support system in place?: Yes (comment) Current home services: DME Criminal Activity/Legal Involvement Pertinent to Current Situation/Hospitalization: No - Comment as needed  Activities of Daily Living Home Assistive Devices/Equipment: Walker (specify type),Eyeglasses,Dentures (specify type) ADL Screening (condition at time of admission) Patient's cognitive ability adequate to safely complete daily activities?: Yes Is the patient deaf or have difficulty hearing?: Yes Does the patient have difficulty seeing, even when wearing glasses/contacts?: No Does the patient have difficulty concentrating, remembering, or making decisions?: No Patient able to express need for assistance with ADLs?: Yes Does the patient have difficulty dressing or bathing?: Yes Independently performs ADLs?: No Communication: Independent Dressing (OT): Needs assistance Is this a change from baseline?: Change from baseline, expected to last >3 days Grooming: Needs assistance Is this a change from baseline?: Change from  baseline, expected to last >3 days Feeding: Needs assistance Is this a change from baseline?: Change from baseline, expected to last >3 days Bathing: Needs assistance Is this a change  from baseline?: Change from baseline, expected to last >3 days Toileting: Dependent Is this a change from baseline?: Change from baseline, expected to last >3days In/Out Bed: Dependent Is this a change from baseline?: Change from baseline, expected to last >3 days Walks in Home: Dependent Is this a change from baseline?: Change from baseline, expected to last >3 days Does the patient have difficulty walking or climbing stairs?: Yes Weakness of Legs: Left Weakness of Arms/Hands: None  Permission Sought/Granted Permission sought to share information with : Family Supports Permission granted to share information with : Yes, Verbal Permission Granted  Share Information with NAME: Alison Carpenter  Permission granted to share info w AGENCY: [place] granted to share info w Relationship: Daughter  Permission granted to share info w Contact Information: [phone]  Emotional Assessment Appearance:: Appears stated age Attitude/Demeanor/Rapport: Engaged Affect (typically observed): Accepting,Pleasant Orientation: : Oriented to Self,Oriented to Place,Oriented to  Time,Oriented to Situation Alcohol / Substance Use: Not Applicable Psych Involvement: No (comment)  Admission diagnosis:  Hip fracture ([place]) [S72.009A] Closed nondisplaced intertrochanteric fracture of left femur, initial encounter [facility]) [S72.145A] Patient Active Problem List   Diagnosis Date Noted  . Hip fracture ([place]) 06/10/2020  . Closed nondisplaced intertrochanteric fracture of left femur ([place])    PCP:  Lorene Dy, MD Pharmacy:   [id number] [address] [id number]: [phone]: [phone]    Social Determinants of Health (SDOH) Interventions    Readmission Risk Interventions No flowsheet data found.

## 2020-06-11 NOTE — Discharge Instructions (Addendum)
ORTHO DISCHARGE INSTRUCTIONS AFTER ORIF HIP    -ok to shower 3 days postop but no tub soaking  -do not apply any creams or ointments to incision  -Partial weightbearing 50% left lower extremity  -physical therapy per protocol  -no aggressive activity  -no squatting, lifting, driving, climbing until further notice   -resume aspirin to help decrease risk of blood clots, same dose as before admission

## 2020-06-11 NOTE — Evaluation (Signed)
Physical Therapy Evaluation Patient Details Name: Alison Carpenter MRN: [id number] DOB: 08/06/1917 Today's Date: 06/11/2020   History of Present Illness  Pt s/p fall with L hip fx and now s/p IM nail.  Clinical Impression  Pt admitted as above and presenting with functional mobility limitations 2* decreased L LE strength/ROM, post op pain, balance deficits and c/o dizziness/nausea with OOB activity.  Pt hopes to progress to dc home but may need to consider ST-SNF level rehab to maximize IND and safety - dependent on acute stay progress and level of assist pt can arrange for home.    Follow Up Recommendations Home health PT;SNF (dependent on acute stay progress and level of assist pt can arrange at home)    Equipment Recommendations  None recommended by PT    Recommendations for Other Services       Precautions / Restrictions Precautions Precautions: Fall Restrictions Weight Bearing Restrictions: Yes LLE Weight Bearing: Partial weight bearing LLE Partial Weight Bearing Percentage or Pounds: 50%      Mobility  Bed Mobility Overal bed mobility: Needs Assistance Bed Mobility: Supine to Sit     Supine to sit: Min assist;+2 for physical assistance;+2 for safety/equipment     General bed mobility comments: increased time with cues for sequence and use of R LE to self assist.  Physical assist to manage L LE and to bring trunk to upright    Transfers Overall transfer level: Needs assistance Equipment used: Rolling walker (2 wheeled) Transfers: Sit to/from Stand Sit to Stand: Min assist;Mod assist;From elevated surface;+2 safety/equipment         General transfer comment: cues for LE management and use of UEs to self assist.  Physical assist to bring wt up and fwd and to balance in standing  Ambulation/Gait Ambulation/Gait assistance: Min assist;+2 safety/equipment Gait Distance (Feet): 4 Feet Assistive device: Rolling walker (2 wheeled) Gait Pattern/deviations:  Step-to pattern;Shuffle;Decreased step length - left;Decreased stance time - left;Trunk flexed Gait velocity: decr   General Gait Details: cues for sequence, posture and position from RW;  physical assist for balance/support and RW management  Stairs            Wheelchair Mobility    Modified Rankin (Stroke Patients Only)       Balance Overall balance assessment: Needs assistance Sitting-balance support: No upper extremity supported;Feet supported Sitting balance-Leahy Scale: Fair     Standing balance support: Bilateral upper extremity supported Standing balance-Leahy Scale: Poor                               Pertinent Vitals/Pain Pain Assessment: 0-10 Pain Score: 8  Pain Location: L hip Pain Descriptors / Indicators: Grimacing;Guarding Pain Intervention(s): Limited activity within patient's tolerance;Monitored during session;Premedicated before session;Patient requesting pain meds-RN notified    Home Living Family/patient expects to be discharged to:: Private residence Living Arrangements: Children Available Help at Discharge: Family;Available 24 hours/day Type of Home: House Home Access: Stairs to enter   [employer] of Steps: 1 Home Layout: One level Home Equipment: Walker - 2 wheels Additional Comments: Pt states she lives with oldest dtr who can not physically assist her but thinks she may be able to arrange alternative assist.    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, systems  Upper Extremity Assessment Upper Extremity Assessment: Overall WFL for tasks assessed    Lower Extremity Assessment Lower Extremity Assessment: LLE deficits/detail LLE: Unable to fully assess due to pain    Cervical / Trunk Assessment Cervical / Trunk Assessment: Kyphotic  Communication   Communication: No difficulties   Cognition Arousal/Alertness: Awake/alert Behavior During Therapy: WFL for tasks assessed/performed Overall Cognitive Status: Within Functional Limits for tasks assessed                                        General Comments      Exercises     Assessment/Plan    PT Assessment Patient needs continued PT services  PT Problem List Decreased strength;Decreased range of motion;Decreased activity tolerance;Decreased balance;Decreased mobility;Decreased knowledge of use of DME;Pain       PT Treatment Interventions DME instruction;Gait training;Stair training;Functional mobility training;Therapeutic activities;Therapeutic exercise;Patient/family education    PT Goals (Current goals can be found in the Care Plan section)  Acute Rehab PT Goals Patient Stated Goal: Regain IND PT Goal Formulation: With patient Time For Goal Achievement: 06/18/20 Potential to Achieve Goals: Good    Frequency Min 6X/week   Barriers to discharge Decreased caregiver support [employer] assist currently available; pt thinks she may be able to arrange assitance    Co-evaluation PT/OT/SLP Co-Evaluation/Treatment: Yes Reason for Co-Treatment: For patient/therapist safety PT goals addressed during session: Mobility/safety with mobility OT goals addressed during session: ADL's and self-care       AM-PAC PT "6 Clicks" Mobility  Outcome Measure Help needed turning from your back to your side while in a flat bed without using bedrails?: A Lot Help needed moving from lying on your back to sitting on the side of a flat bed without using bedrails?: A Lot Help needed moving to and from a bed to a chair (including a wheelchair)?: A Lot Help needed standing up from a chair using your arms (e.g., wheelchair or bedside chair)?: A Lot Help needed to walk in hospital room?: A Lot Help needed climbing 3-5 steps with a railing? : A Lot 6 Click Score: 12    End of Session Equipment Utilized During Treatment:  Gait belt Activity Tolerance: Patient limited by fatigue;Patient limited by pain Patient left: in chair;with chair alarm set;with call bell/phone within reach Nurse Communication: Mobility status PT Visit Diagnosis: History of falling (Z91.81);Difficulty in walking, not elsewhere classified (R26.2);Pain Pain - Right/Left: Left Pain - part of body: Hip    Time: 2671-2458 PT Time Calculation (min) (ACUTE ONLY): 26 min   Charges:   PT Evaluation $PT Eval Low Complexity: 1 Low          Mauro Kaufmann PT Acute Rehabilitation Services Pager [phone] Office [phone]   [place] Fanfan 06/11/2020, 8:42 AM

## 2020-06-11 NOTE — Progress Notes (Signed)
   Subjective: 1 Day Post-Op Procedure(s) (LRB): INTRAMEDULLARY (IM) NAIL FEMORAL (Left) Patient reports pain as mild. Had Morphine at 1:30 AM and nauseated. Zofran given by RN .  Objective: Vital signs in last 24 hours: Temp:  [97.1 F (36.2 C)-98.4 F (36.9 C)] 97.5 F (36.4 C) (02/18 0949) Pulse Rate:  [62-103] 83 (02/18 0949) Resp:  [13-19] 16 (02/18 0949) BP: (92-183)/(57-94) 115/74 (02/18 0949) SpO2:  [95 %-100 %] 100 % (02/18 0949) Weight:  [37.2 kg] 37.2 kg (02/17 1624)  Intake/Output from previous day: 02/17 0701 - 02/18 0700 In: 1327.4 [P.O.:120; I.V.:1107.4; IV Piggyback:100] Out: 700 [Urine:600; Blood:100] Intake/Output this shift: Total I/O In: 80 [P.O.:80] Out: 250 [Urine:250]  Recent Labs    06/10/20 1210 06/11/20 0312  HGB 13.2 11.7*   Recent Labs    06/10/20 1210 06/11/20 0312  WBC 9.6 13.7*  RBC 4.15 3.65*  HCT 40.7 36.2  PLT 229 195   Recent Labs    06/10/20 1210 06/11/20 0312  NA 141 138  K 3.5 3.5  CL 103 103  CO2 28 25  BUN 13 12  CREATININE 0.58 0.55  GLUCOSE 98 123*  CALCIUM 8.8* 8.0*   Recent Labs    06/10/20 1210  INR 1.1    Neurologically intact DG C-Arm 1-60 Min-No Report  Result Date: 06/10/2020 Fluoroscopy was utilized by the requesting physician.  No radiographic interpretation.   DG Hip Unilat W or Wo Pelvis 2-3 Views Left  Result Date: 06/10/2020 CLINICAL DATA:  Fall. EXAM: DG HIP (WITH OR WITHOUT PELVIS) 2-3V LEFT COMPARISON:  None. FINDINGS: Acute nondisplaced left intertrochanteric femur fracture. No dislocation. Mild bilateral hip joint space narrowing with small marginal osteophytes. Osteopenia. Prior hernia repair. IMPRESSION: 1. Acute nondisplaced left intertrochanteric femur fracture. Electronically Signed   By: Obie Dredge M.D.   On: 06/10/2020 11:40   DG FEMUR MIN 2 VIEWS LEFT  Result Date: 06/10/2020 CLINICAL DATA:  Left femoral fracture repair EXAM: LEFT FEMUR 2 VIEWS COMPARISON:  06/10/2020  FINDINGS: Three fluoroscopic images are obtained during the performance of the procedure and are provided for interpretation only. Intramedullary rod with proximal dynamic screw and distal interlocking screw traverses the intertrochanteric left hip fracture seen previously. Alignment is anatomic. Please refer to the operative report. IMPRESSION: 1. ORIF of an intertrochanteric left hip fracture with near anatomic alignment. Electronically Signed   By: Sharlet Salina M.D.   On: 06/10/2020 21:31    Assessment/Plan: 1 Day Post-Op Procedure(s) (LRB): INTRAMEDULLARY (IM) NAIL FEMORAL (Left) Up with therapy,   Stop morphine. Ultram ordered and if not effective norco PO available. Would send home on ASA as she took pre-op  for DVT prophylaxis .   Eldred Manges 06/11/2020, 10:58 AM

## 2020-06-11 NOTE — Progress Notes (Signed)
Initial Nutrition Assessment  DOCUMENTATION CODES:   Underweight  INTERVENTION:  - will order Ensure Surgery once/day, each supplement provides 330 kcal and 18 grams protein. - will order Magic Cup with dinner meals, each supplement provides 290 kcal and 9 grams of protein.  NUTRITION DIAGNOSIS:   Increased nutrient needs related to post-op healing as evidenced by estimated needs.  GOAL:   Patient will meet greater than or equal to 90% of their needs  MONITOR:   PO intake,Supplement acceptance,Labs,Weight trends  REASON FOR ASSESSMENT:   Consult Hip fracture protocol  ASSESSMENT:   87 year old female with no significant PMH brought to ED due to L hip pain after accidental fall at her place of work. She underwent left trochanteric nail on 2/17.  Patient's diet was advanced from NPO to Regular yesterday at 2240. Patient was sleeping soundly at the time of visit with daughter at bedside; daughter provided all information.  Patient is still working and prefers to work out of the home, in the office, rather than from home. Family does the grocery shopping but patient enjoys cooking for herself and has always focused on healthy, well-balanced meals. She has no problems chewing or swallowing and there have been no recent changes in appetite or intakes.  Patient has been very nauseated today. Daughter reports that this is common for her the day after a surgery/having anesthesia.    Labs reviewed; Ca: 8 mg/dl. Medications reviewed; 100 mg colace BID. IVF; 1/2 NS @ 75 ml/hr.     NUTRITION - FOCUSED PHYSICAL EXAM:  unable to complete as patient was sleeping.   Diet Order:   Diet Order            Diet regular Room service appropriate? Yes; Fluid consistency: Thin  Diet effective now                 EDUCATION NEEDS:   No education needs have been identified at this time  Skin:  Skin Assessment: Skin Integrity Issues: Skin Integrity Issues:: Incisions Incisions: L  hip (2/17)  Last BM:  2/16  Height:   Ht Readings from Last 1 Encounters:  06/10/20 5\' 3"  (1.6 m)    Weight:   Wt Readings from Last 1 Encounters:  06/10/20 37.2 kg    Estimated Nutritional Needs:  Kcal:  1225-1425 kcal Protein:  65-75 grams Fluid:  >/= 1.5 L/day      06/12/20, MS, RD, LDN, CNSC Inpatient Clinical Dietitian RD pager # available in [facility]  After hours/weekend pager # available in [facility]

## 2020-06-11 NOTE — Progress Notes (Signed)
Occupational Therapy Evaluation  Patient's DTR lives with her in a single level house with 1 STE. Patient fully I at baseline, still works as a Merchandiser, retail. Per pt her DTR fell and broke her hip which is why she is living with pt now. Per RN report patient's DTR has dementia. Pt states DTR can help her just not physically, may be able to arrange additional help at home "this happened so sudden." Pt requiring cues for sequencing bed mobility, transfers and ambulation in order to maintain WB restrictions and maximize safety. Pt min A x2 for sit to stand from EOB and min x1-2 to take few steps with walker, however starts feeling dizzy + nauseous therefore transferred in to chair. BP reading 138/88 and RN present during transfer. Recommend continued acute OT services to maximize patient safety, activity tolerance, balance and further education for compensatory strategies in order to maximize I with self care. If patient can have additional help at home may be able to D/C with [facility], otherwise will likely need ST rehab.    06/11/20 1000  OT Visit Information  Assistance Needed +2 (safety)  PT/OT/SLP Co-Evaluation/Treatment Yes  Reason for Co-Treatment For patient/therapist safety;To address functional/ADL transfers  PT goals addressed during session Mobility/safety with mobility  OT goals addressed during session ADL's and self-care  History of Present Illness Pt s/p fall with L hip fx and now s/p IM nail. no significant PMH  Precautions  Precautions Fall  Restrictions  Weight Bearing Restrictions Yes  LLE Weight Bearing PWB  LLE Partial Weight Bearing Percentage or Pounds 50%  Home Living  Family/patient expects to be discharged to: Private residence  Living Arrangements Children  Available Help at Discharge Family;Available 24 hours/day  Type of Home House  Home Access Stairs to enter  Entrance Stairs-Number of Steps 1  Home Layout One level  Bathroom Shower/Tub Walk-in Data processing manager - 2 wheels;Wheelchair - manual;Grab bars - tub/shower;Grab bars - toilet  Additional Comments Pt states she lives with oldest dtr who can not physically assist her but thinks she may be able to arrange alternative assist.  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  Pain Assessment  Pain Assessment 0-10  Pain Score 8  Pain Location L hip  Pain Descriptors / Indicators Grimacing;Guarding  Pain Intervention(s) Limited activity within patient's tolerance;Monitored during session;Premedicated before session;Patient requesting pain meds-RN notified  Cognition  Arousal/Alertness Awake/alert  Behavior During Therapy WFL for tasks assessed/performed  Overall Cognitive Status Within Functional Limits for tasks assessed  Upper Extremity Assessment  Upper Extremity Assessment Overall WFL for tasks assessed  Lower Extremity Assessment  Lower Extremity Assessment Defer to PT evaluation  Cervical / Trunk Assessment  Cervical / Trunk Assessment Kyphotic  ADL  Overall ADL's  Needs assistance/impaired  Eating/Feeding Independent;Sitting  Grooming Set up;Sitting  Upper Body Bathing Set up;Sitting  Lower Body Bathing Moderate assistance;Sitting/lateral leans;Sit to/from stand  Upper Body Dressing  Set up;Sitting  Lower Body Dressing Moderate assistance;Sit to/from stand;Sitting/lateral leans  Lower Body Dressing Details (indicate cue type and reason) increased pain with activity  Toilet Transfer Minimal assistance;+2 for safety/equipment;Cueing for safety;Cueing for sequencing;RW  Toilet Transfer Details (indicate cue type and reason) cues to sequence to maintain WB restrictions, patient having increased pain with taking few steps then reports dizziness and nausea. BP checked in sitting WNL and RN present  ToiletingTeacher, music  and Hygiene Moderate  assistance;Sit to/from stand  Functional mobility during ADLs Minimal assistance;+2 for safety/equipment;Cueing for safety;Cueing for sequencing;Rolling walker  General ADL Comments patient requiring increased assistance with self care tasks due to pain limiting activity tolerance, balance and medical complications of nausea/dizziness this session. BP 138/88 in chair  Bed Mobility  Overal bed mobility Needs Assistance  Bed Mobility Supine to Sit  Supine to sit Min assist;+2 for safety/equipment  General bed mobility comments cues to sequence, needing assist managing L LE to EOB and min A for trunk  Transfers  Overall transfer level Needs assistance  Equipment used Rolling walker (2 wheeled)  Transfers Sit to/from Stand  Sit to Stand Min assist;From elevated surface;+2 safety/equipment  General transfer comment pt needing verbal cues for body mechanics, please see toilet transfer in ADL section  Balance  Overall balance assessment Needs assistance  Sitting-balance support No upper extremity supported;Feet supported  Sitting balance-Leahy Scale Fair  Standing balance support Bilateral upper extremity supported  Standing balance-Leahy Scale Poor  Standing balance comment reliant on UEs and external assist  OT - End of Session  Equipment Utilized During Treatment Gait belt;Rolling walker  Activity Tolerance Patient tolerated treatment well  Patient left in chair;with call bell/phone within reach;with chair alarm set;with nursing/sitter in room  Nurse Communication Other (comment) (RN present for session)  OT Assessment  OT Recommendation/Assessment Patient needs continued OT Services  OT Visit Diagnosis Unsteadiness on feet (R26.81);History of falling (Z91.81);Pain  Pain - Right/Left Left  Pain - part of body Hip  OT Problem List Decreased activity tolerance;Impaired balance (sitting and/or standing);Decreased safety awareness;Decreased knowledge of precautions;Pain  OT Plan  OT  Frequency (ACUTE ONLY) Min 2X/week  OT Treatment/Interventions (ACUTE ONLY) Self-care/ADL training;DME and/or AE instruction;Therapeutic activities;Patient/family education;Balance training  AM-PAC OT "6 Clicks" Daily Activity Outcome Measure (Version 2)  Help from another person eating meals? 4  Help from another person taking care of personal grooming? 3  Help from another person toileting, which includes using toliet, bedpan, or urinal? 2  Help from another person bathing (including washing, rinsing, drying)? 2  Help from another person to put on and taking off regular upper body clothing? 3  Help from another person to put on and taking off regular lower body clothing? 2  6 Click Score 16  OT Recommendation  Follow Up Recommendations Home health OT;Supervision/Assistance - 24 hour;Other (comment) (if patient can arrange additional A at home, otherwise ST rehab)  OT Equipment 3 in 1 bedside commode;Other (comment) (use for shower chair as well if does not have)  Individuals Consulted  Consulted and Agree with Results and Recommendations Patient  Acute Rehab OT Goals  Patient Stated Goal Regain IND  OT Goal Formulation With patient  Time For Goal Achievement 06/25/20  Potential to Achieve Goals Good  OT Time Calculation  OT Start Time (ACUTE ONLY) 0751  OT Stop Time (ACUTE ONLY) 8003  OT Time Calculation (min) 30 min  OT General Charges  $OT Visit 1 Visit  OT Evaluation  $OT Eval Low Complexity 1 Low  Written Expression  Dominant Hand  (did not specify)   Marlyce Huge OT OT pager: [phone]

## 2020-06-12 DIAGNOSIS — R110 Nausea: Secondary | ICD-10-CM

## 2020-06-12 DIAGNOSIS — W19XXXA Unspecified fall, initial encounter: Secondary | ICD-10-CM | POA: Diagnosis not present

## 2020-06-12 DIAGNOSIS — R636 Underweight: Secondary | ICD-10-CM | POA: Diagnosis not present

## 2020-06-12 DIAGNOSIS — S72145A Nondisplaced intertrochanteric fracture of left femur, initial encounter for closed fracture: Secondary | ICD-10-CM | POA: Diagnosis not present

## 2020-06-12 MED ORDER — TRAMADOL HCL 50 MG PO TABS
50.0000 mg | ORAL_TABLET | Freq: Two times a day (BID) | ORAL | Status: DC | PRN
  Filled 2020-06-12: qty 1

## 2020-06-12 MED ORDER — TRAMADOL HCL 50 MG PO TABS: 50.0000 mg | ORAL_TABLET | Freq: Three times a day (TID) | ORAL | Status: DC | PRN

## 2020-06-12 MED ORDER — OXYCODONE HCL 5 MG PO TABS: 5.0000 mg | ORAL_TABLET | Freq: Three times a day (TID) | ORAL | Status: DC | PRN

## 2020-06-12 MED ORDER — ACETAMINOPHEN 325 MG PO TABS
650.0000 mg | ORAL_TABLET | Freq: Three times a day (TID) | ORAL | Status: DC
  Administered 2020-06-12 – 2020-06-16 (×11): 650 mg via ORAL
  Filled 2020-06-12 (×12): qty 2

## 2020-06-12 NOTE — Progress Notes (Signed)
   Subjective: 2 Days Post-Op Procedure(s) (LRB): INTRAMEDULLARY (IM) NAIL FEMORAL (Left) Patient reports pain as mild.  Principle problem is nausea. Got 2 norco and desats off O2 now. Percocet ordered by hospitalist.   Objective: Vital signs in last 24 hours: Temp:  [97.5 F (36.4 C)-98.1 F (36.7 C)] 98.1 F (36.7 C) (02/19 0614) Pulse Rate:  [76-83] 83 (02/19 0614) Resp:  [15-18] 16 (02/19 0614) BP: (115-137)/(63-76) 119/68 (02/19 0614) SpO2:  [96 %-100 %] 100 % (02/19 0614)  Intake/Output from previous day: 02/18 0701 - 02/19 0700 In: 984.9 [P.O.:80; I.V.:904.9] Out: 800 [Urine:800] Intake/Output this shift: Total I/O In: 120 [P.O.:120] Out: 100 [Urine:100]  Recent Labs    06/10/20 1210 06/11/20 0312  HGB 13.2 11.7*   Recent Labs    06/10/20 1210 06/11/20 0312  WBC 9.6 13.7*  RBC 4.15 3.65*  HCT 40.7 36.2  PLT 229 195   Recent Labs    06/10/20 1210 06/11/20 0312  NA 141 138  K 3.5 3.5  CL 103 103  CO2 28 25  BUN 13 12  CREATININE 0.58 0.55  GLUCOSE 98 123*  CALCIUM 8.8* 8.0*   Recent Labs    06/10/20 1210  INR 1.1    Neurologically intact No results found.  Assessment/Plan: 2 Days Post-Op Procedure(s) (LRB): INTRAMEDULLARY (IM) NAIL FEMORAL (Left) Up with therapy  Stop percocet.  Tylenol for pain. One ultram po q 8 hrs only for severe pain.       Pre-admission her only med was baby ASA.   Pain plan is use tylenol regularly and ultram if pain is worse.  My cell [phone]  Eldred Manges 06/12/2020, 12:35 PM

## 2020-06-12 NOTE — Plan of Care (Signed)
  Problem: Education: Goal: Knowledge of General Education information will improve Description: Including pain rating scale, medication(s)/side effects and non-pharmacologic comfort measures Outcome: Progressing   Problem: Activity: Goal: Risk for activity intolerance will decrease Outcome: Progressing   Problem: Nutrition: Goal: Adequate nutrition will be maintained 06/12/2020 1956 by Brett Albino, RN Outcome: Progressing 06/12/2020 0602 by Brett Albino, RN Outcome: Progressing

## 2020-06-12 NOTE — Plan of Care (Signed)
  Problem: Education: Goal: Knowledge of General Education information will improve Description: Including pain rating scale, medication(s)/side effects and non-pharmacologic comfort measures Outcome: Progressing   Problem: Nutrition: Goal: Adequate nutrition will be maintained Outcome: Progressing   

## 2020-06-12 NOTE — Progress Notes (Signed)
Physical Therapy Treatment Patient Details Name: Alison Carpenter MRN: [id number] DOB: 19-Nov-1917 Today's Date: 06/12/2020    History of Present Illness Pt s/p fall with L hip fx and now s/p IM nail. no significant PMH    PT Comments    Pt continues pleasant and cooperative but progress limited by continues c/o nausea and headache.   Follow Up Recommendations  Home health PT;SNF     Equipment Recommendations  None recommended by PT    Recommendations for Other Services OT consult     Precautions / Restrictions Precautions Precautions: Fall Restrictions Weight Bearing Restrictions: Yes LLE Weight Bearing: Partial weight bearing LLE Partial Weight Bearing Percentage or Pounds: 50%    Mobility  Bed Mobility Overal bed mobility: Needs Assistance Bed Mobility: Sit to Supine       Sit to supine: Min assist;+2 for physical assistance;+2 for safety/equipment   General bed mobility comments: cues for sequence with assist to manage LEs onto bed and to control trunk    Transfers Overall transfer level: Needs assistance Equipment used: Rolling walker (2 wheeled) Transfers: Sit to/from Stand Sit to Stand: Min assist;From elevated surface;+2 safety/equipment         General transfer comment: cues for LE management and use of UEs to self assist  Ambulation/Gait Ambulation/Gait assistance: Min assist;+2 safety/equipment Gait Distance (Feet): 3 Feet Assistive device: Rolling walker (2 wheeled) Gait Pattern/deviations: Step-to pattern;Shuffle;Decreased step length - left;Decreased stance time - left;Trunk flexed Gait velocity: decr   General Gait Details: cues for sequence, posture and position from RW;  physical assist for balance/support and RW management   Stairs             Wheelchair Mobility    Modified Rankin (Stroke Patients Only)       Balance Overall balance assessment: Needs assistance Sitting-balance support: No upper extremity  supported;Feet supported Sitting balance-Leahy Scale: Fair     Standing balance support: Bilateral upper extremity supported Standing balance-Leahy Scale: Poor Standing balance comment: reliant on UEs and external assist                            Cognition Arousal/Alertness: Awake/alert Behavior During Therapy: WFL for tasks assessed/performed Overall Cognitive Status: Within Functional Limits for tasks assessed                                        Exercises      General Comments        Pertinent Vitals/Pain Pain Assessment: 0-10 Pain Score: 4  Pain Location: L hip Pain Descriptors / Indicators: Aching;Sore Pain Intervention(s): Limited activity within patient's tolerance    Home Living                      Prior Function            PT Goals (current goals can now be found in the care plan section) Acute Rehab PT Goals Patient Stated Goal: Regain IND PT Goal Formulation: With patient Time For Goal Achievement: 06/18/20 Potential to Achieve Goals: Good Progress towards PT goals: Not progressing toward goals - comment (nausea)    Frequency    Min 6X/week      PT Plan Current plan remains appropriate    Co-evaluation              AM-PAC PT "6  Clicks" Mobility   Outcome Measure  Help needed turning from your back to your side while in a flat bed without using bedrails?: A Lot Help needed moving from lying on your back to sitting on the side of a flat bed without using bedrails?: A Lot Help needed moving to and from a bed to a chair (including a wheelchair)?: A Lot Help needed standing up from a chair using your arms (e.g., wheelchair or bedside chair)?: A Lot Help needed to walk in hospital room?: A Lot Help needed climbing 3-5 steps with a railing? : A Lot 6 Click Score: 12    End of Session Equipment Utilized During Treatment: Gait belt Activity Tolerance: Patient limited by fatigue;Patient limited by  pain;Other (comment) Patient left: in bed;with call bell/phone within reach;with bed alarm set Nurse Communication: Mobility status PT Visit Diagnosis: History of falling (Z91.81);Difficulty in walking, not elsewhere classified (R26.2);Pain Pain - Right/Left: Left Pain - part of body: Hip     Time: 0349-1791 PT Time Calculation (min) (ACUTE ONLY): 13 min  Charges:  $Therapeutic Activity: 8-22 mins                     Mauro Kaufmann PT Acute Rehabilitation Services Pager [phone] Office [phone]    Hamid Brookens 06/12/2020, 4:45 PM

## 2020-06-12 NOTE — Progress Notes (Signed)
Physical Therapy Treatment Patient Details Name: Alison Carpenter MRN: [id number] DOB: 1917-08-05 Today's Date: 06/12/2020    History of Present Illness Pt s/p fall with L hip fx and now s/p IM nail. no significant PMH    PT Comments    Pt continues very cooperative and with improved pain control this date but ltd by nausea with OOB activity - RN aware.   Follow Up Recommendations  Home health PT;SNF     Equipment Recommendations  None recommended by PT    Recommendations for Other Services OT consult     Precautions / Restrictions Precautions Precautions: Fall Restrictions Weight Bearing Restrictions: Yes LLE Weight Bearing: Partial weight bearing LLE Partial Weight Bearing Percentage or Pounds: 50%    Mobility  Bed Mobility Overal bed mobility: Needs Assistance Bed Mobility: Supine to Sit     Supine to sit: Min assist;+2 for safety/equipment     General bed mobility comments: cues to sequence, needing assist managing L LE to EOB and min A for trunk    Transfers Overall transfer level: Needs assistance Equipment used: Rolling walker (2 wheeled) Transfers: Sit to/from [employer] Sit to Stand: Min assist;From elevated surface;+2 safety/equipment Stand pivot transfers: Mod assist       General transfer comment: Mod assist to stand/pvt bed to [facility] with pt becoming impulsive 2* bowel urgency.  Min/mod assist with cues for LE management and use of UEs to stand from [facility]  Ambulation/Gait Ambulation/Gait assistance: Min assist;+2 safety/equipment Gait Distance (Feet): 4 Feet Assistive device: Rolling walker (2 wheeled) Gait Pattern/deviations: Step-to pattern;Shuffle;Decreased step length - left;Decreased stance time - left;Trunk flexed Gait velocity: decr   General Gait Details: cues for sequence, posture and position from RW;  physical assist for balance/support and RW management   Stairs             Wheelchair Mobility     Modified Rankin (Stroke Patients Only)       Balance Overall balance assessment: Needs assistance Sitting-balance support: No upper extremity supported;Feet supported Sitting balance-Leahy Scale: Fair     Standing balance support: Bilateral upper extremity supported Standing balance-Leahy Scale: Poor Standing balance comment: reliant on UEs and external assist                            Cognition Arousal/Alertness: Awake/alert Behavior During Therapy: WFL for tasks assessed/performed Overall Cognitive Status: Within Functional Limits for tasks assessed                                        Exercises General Exercises - Lower Extremity Ankle Circles/Pumps: AROM;Both;15 reps;Supine Quad Sets: AAROM;Supine;10 reps;Both Heel Slides: AAROM;Left;20 reps;Supine Hip ABduction/ADduction: AAROM;Left;15 reps;Supine    General Comments        Pertinent Vitals/Pain Pain Assessment: 0-10 Pain Score: 4  Pain Location: L hip Pain Descriptors / Indicators: Aching;Sore Pain Intervention(s): Limited activity within patient's tolerance;Monitored during session;Premedicated before session    Home Living                      Prior Function            PT Goals (current goals can now be found in the care plan section) Acute Rehab PT Goals Patient Stated Goal: Regain IND PT Goal Formulation: With patient Time For Goal Achievement: 06/18/20 Potential to Achieve Goals:  Good    Frequency    Min 6X/week      PT Plan Current plan remains appropriate    Co-evaluation              AM-PAC PT "6 Clicks" Mobility   Outcome Measure  Help needed turning from your back to your side while in a flat bed without using bedrails?: A Lot Help needed moving from lying on your back to sitting on the side of a flat bed without using bedrails?: A Lot Help needed moving to and from a bed to a chair (including a wheelchair)?: A Lot Help needed  standing up from a chair using your arms (e.g., wheelchair or bedside chair)?: A Lot Help needed to walk in hospital room?: A Lot Help needed climbing 3-5 steps with a railing? : A Lot 6 Click Score: 12    End of Session Equipment Utilized During Treatment: Gait belt Activity Tolerance: Patient limited by fatigue;Patient limited by pain;Other (comment) (nausea) Patient left: in chair;with chair alarm set;with call bell/phone within reach;with family/visitor present Nurse Communication: Mobility status PT Visit Diagnosis: History of falling (Z91.81);Difficulty in walking, not elsewhere classified (R26.2);Pain Pain - Right/Left: Left Pain - part of body: Hip     Time: 0177-9390 PT Time Calculation (min) (ACUTE ONLY): 29 min  Charges:  $Gait Training: 8-22 mins $Therapeutic Exercise: 8-22 mins                     Mauro Kaufmann PT Acute Rehabilitation Services Pager [phone] Office [phone]    Kyarah Enamorado 06/12/2020, 12:17 PM

## 2020-06-12 NOTE — Progress Notes (Signed)
PROGRESS NOTE  Alison Carpenter WER:[id number] DOB: 1917-09-28   PCP: Burton Apley, MD  Patient is from: Home  DOA: 06/10/2020 LOS: 2  Chief complaints: Accidental fall and left hip pain  Brief Narrative / Interim history: 87 year old F with no significant PMH brought to ED due to left hip pain after accidental fall at her place of work, and admitted for left hip fracture.  She underwent left trochanteric nail by Dr. Ophelia Charter on 2/17.  Subjective: Seen and examined earlier this morning.  No major events overnight.  No complaints this morning but reported nausea to PT and RN.  Objective: Vitals:   06/11/20 1408 06/11/20 1633 06/11/20 2157 06/12/20 0614  BP: 137/76 118/63 115/64 119/68  Pulse: 76 78 77 83  Resp: 15 18 17 16   Temp: (!) 97.5 F (36.4 C) 97.7 F (36.5 C) 98 F (36.7 C) 98.1 F (36.7 C)  TempSrc: Oral Oral    SpO2: 96% 96% 99% 100%  Weight:      Height:        Intake/Output Summary (Last 24 hours) at 06/12/2020 1210 Last data filed at 06/12/2020 1058 Gross per 24 hour  Intake 1024.89 ml  Output 650 ml  Net 374.89 ml   Filed Weights   06/10/20 1624  Weight: 37.2 kg    Examination:  GENERAL: No apparent distress.  Nontoxic. HEENT: MMM.  Vision grossly intact. NECK: Supple.  No apparent JVD.  RESP:  No IWOB.  Fair aeration bilaterally. CVS:  RRR. Heart sounds normal.  ABD/GI/GU: BS+. Abd soft, NTND.  MSK/EXT:  Moves extremities. No apparent deformity. No edema.  SKIN: Dressing over left hip DCI. NEURO: Awake, alert and oriented appropriately.  No apparent focal neuro deficit. PSYCH: Calm. Normal affect.  Procedures:  2/17-left trochanteric nailing for closed intertrochanteric hip fracture  Microbiology summarized: COVID-19 PCR nonreactive.  Assessment & Plan: Accidental fall at place of work Closed intertrochanteric left femoral fracture -S/p trochanteric nail -Scheduled Tylenol with as needed tramadol  -Discontinue indwelling Foley  catheter. -Continue PT/OT -Encourage incentive spirometry  Nausea: Likely due to opiate. -As needed antiemetics  Underweight Body mass index is 14.53 kg/m. Nutrition Problem: Increased nutrient needs Etiology: post-op healing  -Consult dietitian Signs/Symptoms: estimated needs Interventions: Magic cup,Ensure Surgery   DVT prophylaxis:  enoxaparin (LOVENOX) injection 30 mg Start: 06/11/20 0800 SCDs Start: 06/10/20 2241  Code Status: Full code Family Communication: Updated patient's daughter over the phone. Level of care: Med-Surg Status is: Inpatient  Remains inpatient appropriate because:Unsafe d/c plan   Dispo: The patient is from: Home              Anticipated d/c is to: Home with home health              Anticipated d/c date is: 1 day              Patient currently is not medically stable to d/c.   Difficult to place patient No       Consultants:  Orthopedic surgery   Sch Meds:  Scheduled Meds: . acetaminophen  650 mg Oral Q8H  . aspirin EC  81 mg Oral Daily  . docusate sodium  100 mg Oral BID  . enoxaparin (LOVENOX) injection  30 mg Subcutaneous Q24H  . feeding supplement  237 mL Oral Q24H   Continuous Infusions:  PRN Meds:.menthol-cetylpyridinium **OR** phenol, metoCLOPramide **OR** metoCLOPramide (REGLAN) injection, ondansetron **OR** ondansetron (ZOFRAN) IV, oxyCODONE, traMADol  Antimicrobials: Anti-infectives (From admission, onward)   Start  Dose/Rate Route Frequency Ordered Stop   06/11/20 0600  ceFAZolin (ANCEF) IVPB 2g/100 mL premix        2 g 200 mL/hr over 30 Minutes Intravenous On call to O.R. 06/10/20 1958 06/10/20 1946       I have personally reviewed the following labs and images: CBC: Recent Labs  Lab 06/10/20 1210 06/11/20 0312  WBC 9.6 13.7*  NEUTROABS 7.9*  --   HGB 13.2 11.7*  HCT 40.7 36.2  MCV 98.1 99.2  PLT 229 195   BMP &GFR Recent Labs  Lab 06/10/20 1210 06/11/20 0312  NA 141 138  K 3.5 3.5  CL 103 103   CO2 28 25  GLUCOSE 98 123*  BUN 13 12  CREATININE 0.58 0.55  CALCIUM 8.8* 8.0*   Estimated Creatinine Clearance: 22 mL/min (by C-G formula based on SCr of 0.55 mg/dL). Liver & Pancreas: No results for input(s): AST, ALT, ALKPHOS, BILITOT, PROT, ALBUMIN in the last 168 hours. No results for input(s): LIPASE, AMYLASE in the last 168 hours. No results for input(s): AMMONIA in the last 168 hours. Diabetic: No results for input(s): HGBA1C in the last 72 hours. No results for input(s): GLUCAP in the last 168 hours. Cardiac Enzymes: No results for input(s): CKTOTAL, CKMB, CKMBINDEX, TROPONINI in the last 168 hours. No results for input(s): PROBNP in the last 8760 hours. Coagulation Profile: Recent Labs  Lab 06/10/20 1210  INR 1.1   Thyroid Function Tests: No results for input(s): TSH, T4TOTAL, FREET4, T3FREE, THYROIDAB in the last 72 hours. Lipid Profile: No results for input(s): CHOL, HDL, LDLCALC, TRIG, CHOLHDL, LDLDIRECT in the last 72 hours. Anemia Panel: No results for input(s): VITAMINB12, FOLATE, FERRITIN, TIBC, IRON, RETICCTPCT in the last 72 hours. Urine analysis: No results found for: COLORURINE, APPEARANCEUR, LABSPEC, PHURINE, GLUCOSEU, HGBUR, BILIRUBINUR, KETONESUR, PROTEINUR, UROBILINOGEN, NITRITE, LEUKOCYTESUR Sepsis Labs: Invalid input(s): PROCALCITONIN, LACTICIDVEN  Microbiology: Recent Results (from the past 240 hour(s))  Resp Panel by RT-PCR (Flu A&B, Covid) Nasopharyngeal Swab     Status: None   Collection Time: 06/10/20 11:45 AM   Specimen: Nasopharyngeal Swab; Nasopharyngeal(NP) swabs in vial transport medium  Result Value Ref Range Status   SARS Coronavirus 2 by RT PCR NEGATIVE NEGATIVE Final    Comment: (NOTE) SARS-CoV-2 target nucleic acids are NOT DETECTED.  The SARS-CoV-2 RNA is generally detectable in upper respiratory specimens during the acute phase of infection. The lowest concentration of SARS-CoV-2 viral copies this assay can detect is 138  copies/mL. A negative result does not preclude SARS-Cov-2 infection and should not be used as the sole basis for treatment or other patient management decisions. A negative result may occur with  improper specimen collection/handling, submission of specimen other than nasopharyngeal swab, presence of viral mutation(s) within the areas targeted by this assay, and inadequate number of viral copies(<138 copies/mL). A negative result must be combined with clinical observations, patient history, and epidemiological information. The expected result is Negative.  Fact Sheet for Patients:  BloggerCourse.com  Fact Sheet for Healthcare Providers:  SeriousBroker.it  This test is no t yet approved or cleared by the [place] FDA and  has been authorized for detection and/or diagnosis of SARS-CoV-2 by FDA under an Emergency Use Authorization (EUA). This EUA will remain  in effect (meaning this test can be used) for the duration of the COVID-19 declaration under Section 564(b)(1) of the Act, 21 U.S.C.section 360bbb-3(b)(1), unless the authorization is terminated  or revoked sooner.       Influenza A  by PCR NEGATIVE NEGATIVE Final   Influenza B by PCR NEGATIVE NEGATIVE Final    Comment: (NOTE) The Xpert Xpress SARS-CoV-2/FLU/RSV plus assay is intended as an aid in the diagnosis of influenza from Nasopharyngeal swab specimens and should not be used as a sole basis for treatment. Nasal washings and aspirates are unacceptable for Xpert Xpress SARS-CoV-2/FLU/RSV testing.  Fact Sheet for Patients: BloggerCourse.com  Fact Sheet for Healthcare Providers: SeriousBroker.it  This test is not yet approved or cleared by the [place] FDA and has been authorized for detection and/or diagnosis of SARS-CoV-2 by FDA under an Emergency Use Authorization (EUA). This EUA will remain in effect (meaning  this test can be used) for the duration of the COVID-19 declaration under Section 564(b)(1) of the Act, 21 U.S.C. section 360bbb-3(b)(1), unless the authorization is terminated or revoked.  Performed at [facility], [address], [place], [place] [address]     Radiology Studies: No results found.    Sitlali Koerner T. Juliet Vasbinder [facility]  If 7PM-7AM, please contact night-coverage [email or web address] 06/12/2020, 12:10 PM

## 2020-06-13 DIAGNOSIS — W19XXXA Unspecified fall, initial encounter: Secondary | ICD-10-CM | POA: Diagnosis not present

## 2020-06-13 DIAGNOSIS — D62 Acute posthemorrhagic anemia: Secondary | ICD-10-CM

## 2020-06-13 DIAGNOSIS — R110 Nausea: Secondary | ICD-10-CM | POA: Diagnosis not present

## 2020-06-13 DIAGNOSIS — R636 Underweight: Secondary | ICD-10-CM | POA: Diagnosis not present

## 2020-06-13 DIAGNOSIS — S72145A Nondisplaced intertrochanteric fracture of left femur, initial encounter for closed fracture: Secondary | ICD-10-CM | POA: Diagnosis not present

## 2020-06-13 LAB — FOLATE: Folate: 8.5 ng/mL (ref >5.9)

## 2020-06-13 LAB — CBC
HCT: 26.4 % — ABNORMAL LOW (ref 36.0–46.0)
Hemoglobin: 8.5 g/dL — ABNORMAL LOW (ref 12.0–15.0)
MCH: 31.8 pg (ref 26.0–34.0)
MCHC: 32.2 g/dL (ref 30.0–36.0)
MCV: 98.9 fL (ref 80.0–100.0)
Platelets: 150 10*3/uL (ref 150–400)
RBC: 2.67 MIL/uL — ABNORMAL LOW (ref 3.87–5.11)
RDW: 13.2 % (ref 11.5–15.5)
WBC: 10.4 10*3/uL (ref 4.0–10.5)
nRBC: 0 % (ref 0.0–0.2)

## 2020-06-13 LAB — RETICULOCYTES
Immature Retic Fract: 5 % (ref 2.3–15.9)
RBC.: 2.63 MIL/uL — ABNORMAL LOW (ref 3.87–5.11)
Retic Count, Absolute: 35.8 10*3/uL (ref 19.0–186.0)
Retic Ct Pct: 1.4 % (ref 0.4–3.1)

## 2020-06-13 LAB — RENAL FUNCTION PANEL
Albumin: 2.7 g/dL — ABNORMAL LOW (ref 3.5–5.0)
Anion gap: 6 (ref 5–15)
BUN: 20 mg/dL (ref 8–23)
CO2: 26 mmol/L (ref 22–32)
Calcium: 7.8 mg/dL — ABNORMAL LOW (ref 8.9–10.3)
Chloride: 104 mmol/L (ref 98–111)
Creatinine, Ser: 0.65 mg/dL (ref 0.44–1.00)
GFR, Estimated: >60 mL/min (ref >60)
Glucose, Bld: 88 mg/dL (ref 70–99)
Phosphorus: 2.5 mg/dL (ref 2.5–4.6)
Potassium: 3.9 mmol/L (ref 3.5–5.1)
Sodium: 136 mmol/L (ref 135–145)

## 2020-06-13 LAB — IRON AND TIBC
Iron: 18 ug/dL — ABNORMAL LOW (ref 28–170)
Saturation Ratios: 9 % — ABNORMAL LOW (ref 10.4–31.8)
TIBC: 192 ug/dL — ABNORMAL LOW (ref 250–450)
UIBC: 174 ug/dL

## 2020-06-13 LAB — HEMOGLOBIN AND HEMATOCRIT, BLOOD
HCT: 25.9 % — ABNORMAL LOW (ref 36.0–46.0)
Hemoglobin: 8.3 g/dL — ABNORMAL LOW (ref 12.0–15.0)

## 2020-06-13 LAB — VITAMIN B12: Vitamin B-12: 408 pg/mL (ref 180–914)

## 2020-06-13 LAB — MAGNESIUM: Magnesium: 1.8 mg/dL (ref 1.7–2.4)

## 2020-06-13 LAB — FERRITIN: Ferritin: 121 ng/mL (ref 11–307)

## 2020-06-13 MED ORDER — POLYETHYLENE GLYCOL 3350 17 G PO PACK: 17.0000 g | PACK | Freq: Two times a day (BID) | ORAL | Status: DC | PRN

## 2020-06-13 MED ORDER — SENNOSIDES-DOCUSATE SODIUM 8.6-50 MG PO TABS: 1.0000 | ORAL_TABLET | Freq: Two times a day (BID) | ORAL | Status: DC | PRN

## 2020-06-13 MED ORDER — SODIUM CHLORIDE 0.9 % IV SOLN: 510.0000 mg | Freq: Once | INTRAVENOUS | Status: DC

## 2020-06-13 MED ORDER — SODIUM CHLORIDE 0.9 % IV SOLN
25.0000 mg | Freq: Once | INTRAVENOUS | Status: AC
  Administered 2020-06-13: 25 mg via INTRAVENOUS
  Filled 2020-06-13: qty 2

## 2020-06-13 NOTE — Progress Notes (Addendum)
PROGRESS NOTE  JACQULYN BARRESI RFF:[id number] DOB: Jun 04, 1917   PCP: Burton Apley, MD  Patient is from: Home.  Independent for ADLs and IADLs.  Still driving and working.  DOA: 06/10/2020 LOS: 3  Chief complaints: Accidental fall and left hip pain  Brief Narrative / Interim history: 87 year old F with no significant PMH brought to ED due to left hip pain after accidental fall at her place of work, and admitted for left hip fracture.  She underwent left trochanteric nail by Dr. Ophelia Charter on 2/17.  Hospital course noteworthy of acute blood loss anemia  Subjective: Seen and examined earlier this morning.  No major events overnight of this morning.  No complaints.  She denies pain.  Has not a bowel movement.  Voiding okay.  Objective: Vitals:   06/12/20 1408 06/12/20 1746 06/12/20 2046 06/13/20 0453  BP: 136/73 124/73 (!) 104/52 (!) 115/56  Pulse: 66 75 75 73  Resp: 16  16 18   Temp: 98 F (36.7 C) 98.3 F (36.8 C) 97.9 F (36.6 C) 97.7 F (36.5 C)  TempSrc: Oral  Oral Oral  SpO2: 100%  93% 90%  Weight:      Height:        Intake/Output Summary (Last 24 hours) at 06/13/2020 1222 Last data filed at 06/13/2020 0900 Gross per 24 hour  Intake 475 ml  Output 150 ml  Net 325 ml   Filed Weights   06/10/20 1624  Weight: 37.2 kg    Examination:  GENERAL: No apparent distress.  Nontoxic. HEENT: MMM.  Vision grossly intact.  Diminished hearing. NECK: Supple.  No apparent JVD.  RESP: On RA.  No IWOB.  Fair aeration bilaterally. CVS:  RRR. Heart sounds normal.  ABD/GI/GU: BS+. Abd soft, NTND.  MSK/EXT:  Moves extremities. No apparent deformity. No edema.  SKIN: Some bruising ecchymosis around surgical site but no fluctuance or hematoma. NEURO: Awake, alert and oriented appropriately.  No apparent focal neuro deficit. PSYCH: Calm. Normal affect.  Procedures:  2/17-left trochanteric nailing for closed intertrochanteric hip fracture  Microbiology summarized: COVID-19  PCR nonreactive.  Assessment & Plan: Accidental fall at place of work Closed intertrochanteric left femoral fracture -S/p trochanteric nail -On scheduled Tylenol with as needed tramadol per surgery. -Continue PT/OT/OOB -Encourage incentive spirometry  Acute blood loss anemia: she has some bruise around surgical site but no hematoma or fluctuance.  Cannot tell about melena or hematochezia as patient has not had bowel movement yet. There is some element of hemodilution from perioperative IV fluid.  Anemia panel with iron deficiency. Recent Labs    06/10/20 1210 06/11/20 0312 06/13/20 0338 06/13/20 1038  HGB 13.2 11.7* 8.5* 8.3*  -Hemoccult -Discontinue aspirin -IV iron after test dose  Nausea: Likely due to opiate.  Resolved. -As needed antiemetics  Constipation?  Has not a bowel movement yet that has not been eating well either. -Scheduled Colace with as needed MiraLAX and Senokot-S  Debility/physical deconditioning: Still driving and working prior to admission.  -OOB/PT/OT  Underweight Body mass index is 14.53 kg/m. Nutrition Problem: Increased nutrient needs Etiology: post-op healing  -Consult dietitian Signs/Symptoms: estimated needs Interventions: Magic cup,Ensure Surgery   DVT prophylaxis:  enoxaparin (LOVENOX) injection 30 mg Start: 06/11/20 0800 SCDs Start: 06/10/20 2241  Code Status: Full code Family Communication: Updated patient's daughter over the phone on 05/1958. Level of care: Med-Surg Status is: Inpatient  Remains inpatient appropriate because:Unsafe d/c plan and Inpatient level of care appropriate due to severity of illness   Dispo: The patient  is from: Home              Anticipated d/c is to: Home with home health once H&H stable, mobile to chair and bathroom              Anticipated d/c date is: 1 day              Patient currently is not medically stable to d/c.   Difficult to place patient No       Consultants:  Orthopedic  surgery   Sch Meds:  Scheduled Meds: . acetaminophen  650 mg Oral Q8H  . docusate sodium  100 mg Oral BID  . enoxaparin (LOVENOX) injection  30 mg Subcutaneous Q24H  . feeding supplement  237 mL Oral Q24H   Continuous Infusions:  PRN Meds:.menthol-cetylpyridinium **OR** phenol, metoCLOPramide **OR** metoCLOPramide (REGLAN) injection, ondansetron **OR** ondansetron (ZOFRAN) IV, polyethylene glycol, senna-docusate, traMADol  Antimicrobials: Anti-infectives (From admission, onward)   Start     Dose/Rate Route Frequency Ordered Stop   06/11/20 0600  ceFAZolin (ANCEF) IVPB 2g/100 mL premix        2 g 200 mL/hr over 30 Minutes Intravenous On call to O.R. 06/10/20 1958 06/10/20 1946       I have personally reviewed the following labs and images: CBC: Recent Labs  Lab 06/10/20 1210 06/11/20 0312 06/13/20 0338 06/13/20 1038  WBC 9.6 13.7* 10.4  --   NEUTROABS 7.9*  --   --   --   HGB 13.2 11.7* 8.5* 8.3*  HCT 40.7 36.2 26.4* 25.9*  MCV 98.1 99.2 98.9  --   PLT 229 195 150  --    BMP &GFR Recent Labs  Lab 06/10/20 1210 06/11/20 0312 06/13/20 0338  NA 141 138 136  K 3.5 3.5 3.9  CL 103 103 104  CO2 28 25 26   GLUCOSE 98 123* 88  BUN 13 12 20   CREATININE 0.58 0.55 0.65  CALCIUM 8.8* 8.0* 7.8*  MG  --   --  1.8  PHOS  --   --  2.5   Estimated Creatinine Clearance: 22 mL/min (by C-G formula based on SCr of 0.65 mg/dL). Liver & Pancreas: Recent Labs  Lab 06/13/20 0338  ALBUMIN 2.7*   No results for input(s): LIPASE, AMYLASE in the last 168 hours. No results for input(s): AMMONIA in the last 168 hours. Diabetic: No results for input(s): HGBA1C in the last 72 hours. No results for input(s): GLUCAP in the last 168 hours. Cardiac Enzymes: No results for input(s): CKTOTAL, CKMB, CKMBINDEX, TROPONINI in the last 168 hours. No results for input(s): PROBNP in the last 8760 hours. Coagulation Profile: Recent Labs  Lab 06/10/20 1210  INR 1.1   Thyroid Function  Tests: No results for input(s): TSH, T4TOTAL, FREET4, T3FREE, THYROIDAB in the last 72 hours. Lipid Profile: No results for input(s): CHOL, HDL, LDLCALC, TRIG, CHOLHDL, LDLDIRECT in the last 72 hours. Anemia Panel: Recent Labs    06/13/20 0338  VITAMINB12 408  FOLATE 8.5  FERRITIN 121  TIBC 192*  IRON 18*  RETICCTPCT 1.4   Urine analysis: No results found for: COLORURINE, APPEARANCEUR, LABSPEC, PHURINE, GLUCOSEU, HGBUR, BILIRUBINUR, KETONESUR, PROTEINUR, UROBILINOGEN, NITRITE, LEUKOCYTESUR Sepsis Labs: Invalid input(s): PROCALCITONIN, LACTICIDVEN  Microbiology: Recent Results (from the past 240 hour(s))  Resp Panel by RT-PCR (Flu A&B, Covid) Nasopharyngeal Swab     Status: None   Collection Time: 06/10/20 11:45 AM   Specimen: Nasopharyngeal Swab; Nasopharyngeal(NP) swabs in vial transport medium  Result Value Ref  Range Status   SARS Coronavirus 2 by RT PCR NEGATIVE NEGATIVE Final    Comment: (NOTE) SARS-CoV-2 target nucleic acids are NOT DETECTED.  The SARS-CoV-2 RNA is generally detectable in upper respiratory specimens during the acute phase of infection. The lowest concentration of SARS-CoV-2 viral copies this assay can detect is 138 copies/mL. A negative result does not preclude SARS-Cov-2 infection and should not be used as the sole basis for treatment or other patient management decisions. A negative result may occur with  improper specimen collection/handling, submission of specimen other than nasopharyngeal swab, presence of viral mutation(s) within the areas targeted by this assay, and inadequate number of viral copies(<138 copies/mL). A negative result must be combined with clinical observations, patient history, and epidemiological information. The expected result is Negative.  Fact Sheet for Patients:  BloggerCourse.com  Fact Sheet for Healthcare Providers:  SeriousBroker.it  This test is no t yet approved  or cleared by the [place] FDA and  has been authorized for detection and/or diagnosis of SARS-CoV-2 by FDA under an Emergency Use Authorization (EUA). This EUA will remain  in effect (meaning this test can be used) for the duration of the COVID-19 declaration under Section 564(b)(1) of the Act, 21 U.S.C.section 360bbb-3(b)(1), unless the authorization is terminated  or revoked sooner.       Influenza A by PCR NEGATIVE NEGATIVE Final   Influenza B by PCR NEGATIVE NEGATIVE Final    Comment: (NOTE) The Xpert Xpress SARS-CoV-2/FLU/RSV plus assay is intended as an aid in the diagnosis of influenza from Nasopharyngeal swab specimens and should not be used as a sole basis for treatment. Nasal washings and aspirates are unacceptable for Xpert Xpress SARS-CoV-2/FLU/RSV testing.  Fact Sheet for Patients: BloggerCourse.com  Fact Sheet for Healthcare Providers: SeriousBroker.it  This test is not yet approved or cleared by the [place] FDA and has been authorized for detection and/or diagnosis of SARS-CoV-2 by FDA under an Emergency Use Authorization (EUA). This EUA will remain in effect (meaning this test can be used) for the duration of the COVID-19 declaration under Section 564(b)(1) of the Act, 21 U.S.C. section 360bbb-3(b)(1), unless the authorization is terminated or revoked.  Performed at [facility], [address], [place], [place] [address]     Radiology Studies: No results found.    Canden Cieslinski T. Aneeka Bowden [facility]  If 7PM-7AM, please contact night-coverage [email or web address] 06/13/2020, 12:22 PM

## 2020-06-13 NOTE — Plan of Care (Signed)
  Problem: Education: Goal: Knowledge of General Education information will improve Description: Including pain rating scale, medication(s)/side effects and non-pharmacologic comfort measures Outcome: Progressing   Problem: Nutrition: Goal: Adequate nutrition will be maintained Outcome: Progressing   Problem: Coping: Goal: Level of anxiety will decrease Outcome: Progressing   Problem: Elimination: Goal: Will not experience complications related to urinary retention Outcome: Progressing   

## 2020-06-13 NOTE — Progress Notes (Signed)
   Subjective: 3 Days Post-Op Procedure(s) (LRB): INTRAMEDULLARY (IM) NAIL FEMORAL (Left) Patient reports pain as 0 on 0-10 scale and 1 on 0-10 scale.    Objective: Vital signs in last 24 hours: Temp:  [97.7 F (36.5 C)-98.3 F (36.8 C)] 97.7 F (36.5 C) (02/20 0453) Pulse Rate:  [66-75] 73 (02/20 0453) Resp:  [16-18] 18 (02/20 0453) BP: (104-136)/(52-73) 115/56 (02/20 0453) SpO2:  [90 %-100 %] 90 % (02/20 0453)  Intake/Output from previous day: 02/19 0701 - 02/20 0700 In: 360 [P.O.:360] Out: 250 [Urine:250] Intake/Output this shift: Total I/O In: 235 [P.O.:235] Out: -   Recent Labs    06/10/20 1210 06/11/20 0312 06/13/20 0338 06/13/20 1038  HGB 13.2 11.7* 8.5* 8.3*   Recent Labs    06/11/20 0312 06/13/20 0338 06/13/20 1038  WBC 13.7* 10.4  --   RBC 3.65* 2.67*  2.63*  --   HCT 36.2 26.4* 25.9*  PLT 195 150  --    Recent Labs    06/11/20 0312 06/13/20 0338  NA 138 136  K 3.5 3.9  CL 103 104  CO2 25 26  BUN 12 20  CREATININE 0.55 0.65  GLUCOSE 123* 88  CALCIUM 8.0* 7.8*   Recent Labs    06/10/20 1210  INR 1.1    Neurologically intact No results found.  Assessment/Plan: 3 Days Post-Op Procedure(s) (LRB): INTRAMEDULLARY (IM) NAIL FEMORAL (Left) Up with therapy.  Plan home with daughters once mobile to chair and bathroom.  No BM yet  Eldred Manges 06/13/2020, 12:07 PM

## 2020-06-13 NOTE — Progress Notes (Signed)
PT Cancellation Note  Patient Details Name: Alison Carpenter MRN: [id number] DOB: 09-04-1917   Cancelled Treatment:     PT deferred this date on advice of RN.  Pt continues c/o fatigue and nausea and with Hgb noted to have dropped from 11.7 yesterday to 8.5 this am.  Will follow.   [name] 06/13/2020, 3:25 PM

## 2020-06-14 DIAGNOSIS — K5520 Angiodysplasia of colon without hemorrhage: Secondary | ICD-10-CM

## 2020-06-14 DIAGNOSIS — R110 Nausea: Secondary | ICD-10-CM | POA: Diagnosis not present

## 2020-06-14 DIAGNOSIS — S72145A Nondisplaced intertrochanteric fracture of left femur, initial encounter for closed fracture: Secondary | ICD-10-CM | POA: Diagnosis not present

## 2020-06-14 DIAGNOSIS — W19XXXA Unspecified fall, initial encounter: Secondary | ICD-10-CM | POA: Diagnosis not present

## 2020-06-14 DIAGNOSIS — R636 Underweight: Secondary | ICD-10-CM | POA: Diagnosis not present

## 2020-06-14 LAB — CBC
HCT: 24.7 % — ABNORMAL LOW (ref 36.0–46.0)
Hemoglobin: 8.2 g/dL — ABNORMAL LOW (ref 12.0–15.0)
MCH: 33.3 pg (ref 26.0–34.0)
MCHC: 33.2 g/dL (ref 30.0–36.0)
MCV: 100.4 fL — ABNORMAL HIGH (ref 80.0–100.0)
Platelets: 165 10*3/uL (ref 150–400)
RBC: 2.46 MIL/uL — ABNORMAL LOW (ref 3.87–5.11)
RDW: 13.2 % (ref 11.5–15.5)
WBC: 9.2 10*3/uL (ref 4.0–10.5)
nRBC: 0 % (ref 0.0–0.2)

## 2020-06-14 LAB — OCCULT BLOOD X 1 CARD TO LAB, STOOL: Fecal Occult Bld: POSITIVE — AB

## 2020-06-14 MED ORDER — TRAMADOL HCL 50 MG PO TABS: 50.0000 mg | ORAL_TABLET | Freq: Two times a day (BID) | ORAL | 0 refills | Status: DC | PRN

## 2020-06-14 MED ORDER — SODIUM CHLORIDE 0.9 % IV SOLN
510.0000 mg | Freq: Once | INTRAVENOUS | Status: AC
  Administered 2020-06-14: 510 mg via INTRAVENOUS
  Filled 2020-06-14: qty 510

## 2020-06-14 NOTE — Care Management Important Message (Signed)
Important Message  Patient Details IM Letter mailed to Patient's daughter Joyice Faster. Name: Alison Carpenter MRN: [id number] Date of Birth: 10/07/1917   Medicare Important Message Given:  Yes     Caren Macadam 06/14/2020, 1:08 PM

## 2020-06-14 NOTE — Progress Notes (Signed)
PROGRESS NOTE  Alison Carpenter AES:[id number] DOB: 1917-09-20   PCP: Burton Apley, MD  Patient is from: Home.  Independent for ADLs and IADLs.  Still driving and working.  DOA: 06/10/2020 LOS: 4  Chief complaints: Accidental fall and left hip pain  Brief Narrative / Interim history: 87 year old F with no significant PMH brought to ED due to left hip pain after accidental fall at her place of work, and admitted for left hip fracture.  She underwent left trochanteric nail by Dr. Ophelia Charter on 2/17.  Hospital course noteworthy of acute blood loss anemia and positive Hemoccult.  Subjective: Seen and examined earlier this morning.  No major events overnight of this morning.  No complaints.  She denies pain, shortness of breath, nausea or vomiting.  Has not had bowel movement earlier but two loose stools later in the morning per RN.   Objective: Vitals:   06/13/20 1229 06/13/20 2052 06/14/20 0536 06/14/20 1435  BP: (!) 122/55 (!) 123/59 130/64 (!) 145/72  Pulse: 74 74 74 86  Resp: 17 16 16 18   Temp: 99 F (37.2 C) 98.1 F (36.7 C) 98.2 F (36.8 C) 97.8 F (36.6 C)  TempSrc: Oral   Oral  SpO2: 90% 91% 99% 99%  Weight:      Height:        Intake/Output Summary (Last 24 hours) at 06/14/2020 1527 Last data filed at 06/14/2020 1500 Gross per 24 hour  Intake 120 ml  Output --  Net 120 ml   Filed Weights   06/10/20 1624  Weight: 37.2 kg    Examination:  GENERAL: No apparent distress.  Nontoxic. HEENT: MMM.  Vision grossly intact.  Diminished hearing. NECK: Supple.  No apparent JVD.  RESP:  No IWOB.  Fair aeration bilaterally. CVS:  RRR. Heart sounds normal.  ABD/GI/GU: BS+. Abd soft, NTND.  MSK/EXT:  Moves extremities. No apparent deformity. No edema.  SKIN: Some bruising around surgical site but no fluctuance or hematoma. NEURO: Awake, alert and oriented appropriately.  No apparent focal neuro deficit. PSYCH: Calm. Normal affect.  Procedures:  2/17-left  trochanteric nailing for closed intertrochanteric hip fracture  Microbiology summarized: COVID-19 PCR nonreactive.  Assessment & Plan: Accidental fall at place of work Closed intertrochanteric left femoral fracture -S/p trochanteric nailing -On scheduled Tylenol with as needed tramadol per surgery. -Continue PT/OT/OOB -Encourage incentive spirometry  Acute blood loss anemia/iron deficiency anemia: Hemoccult positive but no overt bleeding.  Definitely some element of hemodilution but this alone doesn't explain 5 g drop in Hgb.  She has history of AVM and diverticulosis on her colonoscopy 16 years ago. Recent Labs    06/10/20 1210 06/11/20 0312 06/13/20 0338 06/13/20 1038 06/14/20 0251  HGB 13.2 11.7* 8.5* 8.3* 8.2*  -Discontinued aspirin and Lovenox -IV Feraheme x1.  Additional dose in a week -P.o. ferrous sulfate -SCD for VTE prophylaxis -Appreciate recs by GI  Nausea: Likely due to opiate.  Resolved. -As needed antiemetics  Constipation: Resolved.  -Scheduled Colace with as needed MiraLAX and Senokot-S  Debility/physical deconditioning: driving and working prior to admission.  -OOB/PT/OT  Underweight Body mass index is 14.53 kg/m. Nutrition Problem: Increased nutrient needs Etiology: post-op healing  Signs/Symptoms: estimated needs Interventions: Magic cup,Ensure Surgery   DVT prophylaxis:  SCDs Start: 06/10/20 2241  Code Status: Full code Family Communication: Updated patient's daughter over the phone Level of care: Med-Surg Status is: Inpatient  Remains inpatient appropriate because:Unsafe d/c plan and Inpatient level of care appropriate due to severity of illness  Dispo: The patient is from: Home              Anticipated d/c is to: Home with home health once H&H stable, mobile to chair and bathroom              Anticipated d/c date is: 1 day              Patient currently is not medically stable to d/c.   Difficult to place patient  No       Consultants:  Orthopedic surgery   Sch Meds:  Scheduled Meds: . acetaminophen  650 mg Oral Q8H  . docusate sodium  100 mg Oral BID  . feeding supplement  237 mL Oral Q24H   Continuous Infusions:  PRN Meds:.menthol-cetylpyridinium **OR** phenol, metoCLOPramide **OR** metoCLOPramide (REGLAN) injection, ondansetron **OR** ondansetron (ZOFRAN) IV, polyethylene glycol, senna-docusate, traMADol  Antimicrobials: Anti-infectives (From admission, onward)   Start     Dose/Rate Route Frequency Ordered Stop   06/11/20 0600  ceFAZolin (ANCEF) IVPB 2g/100 mL premix        2 g 200 mL/hr over 30 Minutes Intravenous On call to O.R. 06/10/20 1958 06/10/20 1946       I have personally reviewed the following labs and images: CBC: Recent Labs  Lab 06/10/20 1210 06/11/20 0312 06/13/20 0338 06/13/20 1038 06/14/20 0251  WBC 9.6 13.7* 10.4  --  9.2  NEUTROABS 7.9*  --   --   --   --   HGB 13.2 11.7* 8.5* 8.3* 8.2*  HCT 40.7 36.2 26.4* 25.9* 24.7*  MCV 98.1 99.2 98.9  --  100.4*  PLT 229 195 150  --  165   BMP &GFR Recent Labs  Lab 06/10/20 1210 06/11/20 0312 06/13/20 0338  NA 141 138 136  K 3.5 3.5 3.9  CL 103 103 104  CO2 28 25 26   GLUCOSE 98 123* 88  BUN 13 12 20   CREATININE 0.58 0.55 0.65  CALCIUM 8.8* 8.0* 7.8*  MG  --   --  1.8  PHOS  --   --  2.5   Estimated Creatinine Clearance: 22 mL/min (by C-G formula based on SCr of 0.65 mg/dL). Liver & Pancreas: Recent Labs  Lab 06/13/20 0338  ALBUMIN 2.7*   No results for input(s): LIPASE, AMYLASE in the last 168 hours. No results for input(s): AMMONIA in the last 168 hours. Diabetic: No results for input(s): HGBA1C in the last 72 hours. No results for input(s): GLUCAP in the last 168 hours. Cardiac Enzymes: No results for input(s): CKTOTAL, CKMB, CKMBINDEX, TROPONINI in the last 168 hours. No results for input(s): PROBNP in the last 8760 hours. Coagulation Profile: Recent Labs  Lab 06/10/20 1210  INR  1.1   Thyroid Function Tests: No results for input(s): TSH, T4TOTAL, FREET4, T3FREE, THYROIDAB in the last 72 hours. Lipid Profile: No results for input(s): CHOL, HDL, LDLCALC, TRIG, CHOLHDL, LDLDIRECT in the last 72 hours. Anemia Panel: Recent Labs    06/13/20 0338  VITAMINB12 408  FOLATE 8.5  FERRITIN 121  TIBC 192*  IRON 18*  RETICCTPCT 1.4   Urine analysis: No results found for: COLORURINE, APPEARANCEUR, LABSPEC, PHURINE, GLUCOSEU, HGBUR, BILIRUBINUR, KETONESUR, PROTEINUR, UROBILINOGEN, NITRITE, LEUKOCYTESUR Sepsis Labs: Invalid input(s): PROCALCITONIN, LACTICIDVEN  Microbiology: Recent Results (from the past 240 hour(s))  Resp Panel by RT-PCR (Flu A&B, Covid) Nasopharyngeal Swab     Status: None   Collection Time: 06/10/20 11:45 AM   Specimen: Nasopharyngeal Swab; Nasopharyngeal(NP) swabs in vial transport medium  Result Value Ref Range Status   SARS Coronavirus 2 by RT PCR NEGATIVE NEGATIVE Final    Comment: (NOTE) SARS-CoV-2 target nucleic acids are NOT DETECTED.  The SARS-CoV-2 RNA is generally detectable in upper respiratory specimens during the acute phase of infection. The lowest concentration of SARS-CoV-2 viral copies this assay can detect is 138 copies/mL. A negative result does not preclude SARS-Cov-2 infection and should not be used as the sole basis for treatment or other patient management decisions. A negative result may occur with  improper specimen collection/handling, submission of specimen other than nasopharyngeal swab, presence of viral mutation(s) within the areas targeted by this assay, and inadequate number of viral copies(<138 copies/mL). A negative result must be combined with clinical observations, patient history, and epidemiological information. The expected result is Negative.  Fact Sheet for Patients:  BloggerCourse.com  Fact Sheet for Healthcare Providers:  SeriousBroker.it  This  test is no t yet approved or cleared by the [place] FDA and  has been authorized for detection and/or diagnosis of SARS-CoV-2 by FDA under an Emergency Use Authorization (EUA). This EUA will remain  in effect (meaning this test can be used) for the duration of the COVID-19 declaration under Section 564(b)(1) of the Act, 21 U.S.C.section 360bbb-3(b)(1), unless the authorization is terminated  or revoked sooner.       Influenza A by PCR NEGATIVE NEGATIVE Final   Influenza B by PCR NEGATIVE NEGATIVE Final    Comment: (NOTE) The Xpert Xpress SARS-CoV-2/FLU/RSV plus assay is intended as an aid in the diagnosis of influenza from Nasopharyngeal swab specimens and should not be used as a sole basis for treatment. Nasal washings and aspirates are unacceptable for Xpert Xpress SARS-CoV-2/FLU/RSV testing.  Fact Sheet for Patients: BloggerCourse.com  Fact Sheet for Healthcare Providers: SeriousBroker.it  This test is not yet approved or cleared by the [place] FDA and has been authorized for detection and/or diagnosis of SARS-CoV-2 by FDA under an Emergency Use Authorization (EUA). This EUA will remain in effect (meaning this test can be used) for the duration of the COVID-19 declaration under Section 564(b)(1) of the Act, 21 U.S.C. section 360bbb-3(b)(1), unless the authorization is terminated or revoked.  Performed at [facility], [address], [place], [place] [address]     Radiology Studies: No results found.    Amandine Covino T. Lorene Samaan [facility]  If 7PM-7AM, please contact night-coverage [email or web address] 06/14/2020, 3:27 PM

## 2020-06-14 NOTE — Consult Note (Addendum)
Consultation  Referring Provider: DR Alanda Slim / [facility]  Primary Care Physician:  Burton Apley, MD Primary Gastroenterologist:  Dr.Jaylin Benzel  Reason for Consultation:   Iron deficiency anemia/ heme + stool, significant drop in hgb post op left hip repair  HPI: Alison Carpenter is a 87 y.o. female, generally in excellent health, on no regular medications.  She is still working as a Careers adviser.  She tells me she plans to retire this spring. Patient is known remotely to Dr. Marina Goodell from colonoscopy done in 2006 for surveillance.  She was noted to have multiple small AVMs in the cecum, nonbleeding and an anastomosis at 25 cm as well as diverticulosis. Patient is status post remote cholecystectomy, hysterectomy and sigmoid resection she believes for diverticular disease. She had a fall at work on 06/10/2020 who presented to the emergency room with left hip pain.  She underwent left trochanteric nail for a closed fracture on the day of admission. She was placed on Lovenox postoperatively. On admission 06/10/2020 hemoglobin 13.2 hematocrit of 40.7 MCV of 98, INR 1.1. On 06/11/2020 hemoglobin 11.7, BUN 12/creatinine 0.55 06/13/2020 Hemoglobin 8.5 Hematocrit 26.4, B12 and Folate within Normal Limits, Serum Iron 18 TIBC 192 Iron Saturation of 9 and Ferritin of 121. Today Hemoglobin 8.2 Hematocrit of 24. Patient had not had a bowel movement since admission until today.  She says her stool appeared normal.  Hemoccult was positive.  Patient says she has not been having any recent GI symptoms, denies any issues with heartburn or indigestion, no nausea or vomiting, no abdominal pain no changes in bowel habits.  She says she has been feeling good. She takes a regular aspirin as needed for headaches etc., no regular NSAID use. She reports having a recent annual physical with Dr. Su Hilt within the past 2 months.  She says her blood counts were good and he was very pleased.  She has not required any iron  supplementation.  She says that her blood counts have been a little low off and on over the years and Dr. Su Hilt has monitored.   History reviewed. No pertinent past medical history.  Past Surgical History:  Procedure Laterality Date  . ABDOMINAL HYSTERECTOMY    . APPENDECTOMY    . CATARACT EXTRACTION, BILATERAL    . CHOLECYSTECTOMY    . FEMUR IM NAIL Left 06/10/2020   Procedure: INTRAMEDULLARY (IM) NAIL FEMORAL;  Surgeon: Eldred Manges, MD;  Location: [facility];  Service: Orthopedics;  Laterality: Left;    Prior to Admission medications   Medication Sig Start Date End Date Taking? Authorizing Provider  aspirin EC 81 MG tablet Take 81 mg by mouth daily. Swallow whole.   Yes [provider]  traMADol (ULTRAM) 50 MG tablet Take 1 tablet (50 mg total) by mouth every 12 (twelve) hours as needed. 06/14/20  Yes Eldred Manges, MD    Current Facility-Administered Medications  Medication Dose Route Frequency Provider Last Rate Last Admin  . acetaminophen (TYLENOL) tablet 650 mg  650 mg Oral Q8H Candelaria Stagers T, MD   650 mg at 06/14/20 0553  . docusate sodium (COLACE) capsule 100 mg  100 mg Oral BID Eldred Manges, MD   100 mg at 06/14/20 0816  . feeding supplement (ENSURE SURGERY) liquid 237 mL  237 mL Oral Q24H Candelaria Stagers T, MD   237 mL at 06/14/20 0816  . menthol-cetylpyridinium (CEPACOL) lozenge 3 mg  1 lozenge Oral PRN Eldred Manges, MD  Or  . phenol (CHLORASEPTIC) mouth spray 1 spray  1 spray Mouth/Throat PRN Eldred Manges, MD      . metoCLOPramide (REGLAN) tablet 5-10 mg  5-10 mg Oral Q8H PRN Eldred Manges, MD       Or  . metoCLOPramide (REGLAN) injection 5-10 mg  5-10 mg Intravenous Q8H PRN Eldred Manges, MD   10 mg at 06/11/20 1016  . ondansetron (ZOFRAN) tablet 4 mg  4 mg Oral Q6H PRN Eldred Manges, MD   4 mg at 06/13/20 1000   Or  . ondansetron (ZOFRAN) injection 4 mg  4 mg Intravenous Q6H PRN Eldred Manges, MD   4 mg at 06/12/20 1353  . polyethylene glycol (MIRALAX /  GLYCOLAX) packet 17 g  17 g Oral BID PRN Gonfa, Taye T, MD      . senna-docusate (Senokot-S) tablet 1 tablet  1 tablet Oral BID PRN Candelaria Stagers T, MD      . traMADol (ULTRAM) tablet 50 mg  50 mg Oral Q12H PRN Almon Hercules, MD        Allergies as of 06/10/2020  . (No Known Allergies)    History reviewed. No pertinent family history.  Social History   Socioeconomic History  . Marital status: Married    Spouse name: Not on file  . Number of children: Not on file  . Years of education: Not on file  . Highest education level: Not on file  Occupational History  . Not on file  Tobacco Use  . Smoking status: Never Smoker  . Smokeless tobacco: Never Used  Vaping Use  . Vaping Use: Never used  Substance and Sexual Activity  . Alcohol use: Never  . Drug use: Never  . Sexual activity: Not Currently  Other Topics Concern  . Not on file  Social History Narrative  . Not on file   Social Determinants of Health   Financial Resource Strain: Not on file  Food Insecurity: Not on file  Transportation Needs: Not on file  Physical Activity: Not on file  Stress: Not on file  Social Connections: Not on file  Intimate Partner Violence: Not on file    Review of Systems: Pertinent positive and negative review of systems were noted in the above HPI section.  All other review of systems was otherwise negative. Physical Exam: Vital signs in last 24 hours: Temp:  [98.1 F (36.7 C)-98.2 F (36.8 C)] 98.2 F (36.8 C) (02/21 0536) Pulse Rate:  [74] 74 (02/21 0536) Resp:  [16] 16 (02/21 0536) BP: (123-130)/(59-64) 130/64 (02/21 0536) SpO2:  [91 %-99 %] 99 % (02/21 0536) Last BM Date: 06/14/20 General:   Alert,  Well-developed, well-nourished, pleasant and cooperative in NAD Head:  Normocephalic and atraumatic. Eyes:  Sclera clear, no icterus.   Conjunctiva pink. Ears:  Normal auditory acuity. Nose:  No deformity, discharge,  or lesions. Mouth:  No deformity or lesions.   Neck:  Supple;  no masses or thyromegaly. Lungs:  Clear throughout to auscultation.   No wheezes, crackles, or rhonchi. Heart:  Regular rate and rhythm; no murmurs, clicks, rubs,  or gallops. Abdomen:  Soft,nontender, BS active,nonpalp mass or hsm.   Rectal:  Deferred  Msk:  Symmetrical without gross deformities. . Pulses:  Normal pulses noted. Extremities:  Without clubbing or edema. Neurologic:  Alert and  oriented x4;  grossly normal neurologically. Skin:  Intact without significant lesions or rashes.. Psych:  Alert and cooperative. Normal mood and affect.  Intake/Output from previous day: 02/20 0701 - 02/21 0700 In: 475 [P.O.:475] Out: 300 [Urine:300] Intake/Output this shift: No intake/output data recorded.  Lab Results: Recent Labs    06/13/20 0338 06/13/20 1038 06/14/20 0251  WBC 10.4  --  9.2  HGB 8.5* 8.3* 8.2*  HCT 26.4* 25.9* 24.7*  PLT 150  --  165   BMET Recent Labs    06/13/20 0338  NA 136  K 3.9  CL 104  CO2 26  GLUCOSE 88  BUN 20  CREATININE 0.65  CALCIUM 7.8*   LFT Recent Labs    06/13/20 0338  ALBUMIN 2.7*   PT/INR No results for input(s): LABPROT, INR in the last 72 hours. Hepatitis Panel No results for input(s): HEPBSAG, HCVAB, HEPAIGM, HEPBIGM in the last 72 hours.    IMPRESSION:   #85 87 year old white female status post left hip fracture 06/10/2020, status post left trochanteric nail repair 06/10/2020.  # 2   drop in hemoglobin 4- 5 g since admission without any evidence of overt bleeding.  Patient found to be heme positive today, prior to bowel movement since admission. Also noted to be iron deficient. Patient has previously documented cecal AVMs on remote colonoscopy 2006, and reports intermittent anemia over the past many years. I suspect she has had some chronic intermittent occult GI blood loss secondary to AVMs. Certainly cannot rule out other GI source for chronic GI blood loss given last colonoscopy 2006. I suspect drop in hemoglobin also  multifactoral secondary to volume status, surgical losses.  #3 status post hysterectomy appendectomy cholecystectomy and sigmoid resection  Plan; Lovenox has been discontinued Patient is receiving IV Feraheme today, would plan second dose in around 1 week as outpatient Continue serial hemoglobins, transfuse for hemoglobin 7.5 or less Hemoccult stools over next 24 to 48 hours watch for any melena. Long discussion with patient, she is not interested in any endoscopic evaluation at this time. Would  keep her in the hospital until hemoglobin stabilizes and have her follow-up with her PCP within a week of discharge to repeat CBC, monitor serially. Start oral iron supplement. Repeat hemoglobin in a.m. if continued drop in hemoglobin without evidence of acute GI bleeding would pursue CT imaging of the abdomen and left hip .   Alison Esterwood PA-C 06/14/2020, 1:34 PM  GI ATTENDING  History, laboratories, x-rays, prior endoscopy reports reviewed.  Patient seen and examined.  Agree with comprehensive consultation note as outlined above.  The patient's acute drop in hemoglobin does not appear to be secondary to GI bleeding.  I suspect the majority of blood loss is related to her recent surgery.  Her hemoglobin was normal on admission and stools are brown without overt blood.  She does have a history of GI AVMs.  Agree with supportive measures and monitoring as outlined above.  Would reserve endoscopic investigation for significant overt bleeding.  Thanks  Wilhemina Bonito. Eda Keys., M.D. [facility] Division of Gastroenterology

## 2020-06-14 NOTE — Progress Notes (Signed)
Physical Therapy Treatment Patient Details Name: Alison Carpenter MRN: [id number] DOB: 1917-10-20 Today's Date: 06/14/2020    History of Present Illness Pt s/p fall with L hip fx and now s/p IM nail. no significant PMH    PT Comments    Pt very cooperative this am and with noted improvement in activity tolerance and min c/o dizziness.  Pt performed therex program with assist, up to [facility] for toileting and ambulated increased distance out to hall way.  BP supine 123/61; sit 129/79; stand 130/67.   Follow Up Recommendations  Home health PT;SNF     Equipment Recommendations  None recommended by PT    Recommendations for Other Services       Precautions / Restrictions Precautions Precautions: Fall Restrictions Weight Bearing Restrictions: Yes LLE Weight Bearing: Partial weight bearing LLE Partial Weight Bearing Percentage or Pounds: 50%    Mobility  Bed Mobility Overal bed mobility: Needs Assistance Bed Mobility: Supine to Sit;Sit to Supine     Supine to sit: Min assist;+2 for safety/equipment Sit to supine: Mod assist   General bed mobility comments: cues for sequence with assist to bring trunk to upright getting out of bed and with assist to manage LEs and to control trunk back into bed    Transfers Overall transfer level: Needs assistance Equipment used: Rolling walker (2 wheeled) Transfers: Sit to/from [employer] Transfers Sit to Stand: Min assist;From elevated surface Stand pivot transfers: Min assist       General transfer comment: cues for LE management and use of UEs to self assist  Ambulation/Gait Ambulation/Gait assistance: Min assist;+2 safety/equipment Gait Distance (Feet): 14 Feet Assistive device: Rolling walker (2 wheeled) Gait Pattern/deviations: Step-to pattern;Shuffle;Decreased step length - left;Decreased stance time - left;Trunk flexed Gait velocity: decr   General Gait Details: cues for sequence, posture, position from RW and  increased WB to insure PWB and  physical assist for balance/support and RW management   Stairs             Wheelchair Mobility    Modified Rankin (Stroke Patients Only)       Balance Overall balance assessment: Needs assistance Sitting-balance support: No upper extremity supported;Feet supported Sitting balance-Leahy Scale: Fair     Standing balance support: Bilateral upper extremity supported Standing balance-Leahy Scale: Poor Standing balance comment: reliant on UEs and external assist                            Cognition Arousal/Alertness: Awake/alert Behavior During Therapy: WFL for tasks assessed/performed Overall Cognitive Status: Within Functional Limits for tasks assessed                                        Exercises General Exercises - Lower Extremity Ankle Circles/Pumps: AROM;Both;15 reps;Supine Quad Sets: AAROM;Supine;10 reps;Both Heel Slides: AAROM;Left;20 reps;Supine Hip ABduction/ADduction: AAROM;Left;15 reps;Supine    General Comments        Pertinent Vitals/Pain Pain Assessment: 0-10 Pain Score: 3  Pain Location: L hip Pain Descriptors / Indicators: Aching;Sore Pain Intervention(s): Limited activity within patient's tolerance;Monitored during session    Home Living                      Prior Function            PT Goals (current goals can now be found in the care plan  section) Acute Rehab PT Goals Patient Stated Goal: Regain IND PT Goal Formulation: With patient Time For Goal Achievement: 06/18/20 Potential to Achieve Goals: Good Progress towards PT goals: Progressing toward goals    Frequency    Min 5X/week      PT Plan Current plan remains appropriate    Co-evaluation              AM-PAC PT "6 Clicks" Mobility   Outcome Measure  Help needed turning from your back to your side while in a flat bed without using bedrails?: A Lot Help needed moving from lying on your back to  sitting on the side of a flat bed without using bedrails?: A Lot Help needed moving to and from a bed to a chair (including a wheelchair)?: A Little Help needed standing up from a chair using your arms (e.g., wheelchair or bedside chair)?: A Little Help needed to walk in hospital room?: A Little Help needed climbing 3-5 steps with a railing? : A Lot 6 Click Score: 15    End of Session Equipment Utilized During Treatment: Gait belt Activity Tolerance: Patient limited by fatigue Patient left: in bed;with call bell/phone within reach;with bed alarm set;with family/visitor present Nurse Communication: Mobility status PT Visit Diagnosis: History of falling (Z91.81);Difficulty in walking, not elsewhere classified (R26.2);Pain Pain - Right/Left: Left Pain - part of body: Hip     Time: 1124-1202 PT Time Calculation (min) (ACUTE ONLY): 38 min  Charges:  $Gait Training: 8-22 mins $Therapeutic Exercise: 8-22 mins $Therapeutic Activity: 8-22 mins                     Mauro Kaufmann PT Acute Rehabilitation Services Pager [phone] Office [phone]    Alison Carpenter 06/14/2020, 1:51 PM

## 2020-06-14 NOTE — Progress Notes (Signed)
   Subjective: 4 Days Post-Op Procedure(s) (LRB): INTRAMEDULLARY (IM) NAIL FEMORAL (Left) Patient reports pain as 0 on 0-10 scale and 1 on 0-10 scale.    Objective: Vital signs in last 24 hours: Temp:  [98.1 F (36.7 C)-99 F (37.2 C)] 98.2 F (36.8 C) (02/21 0536) Pulse Rate:  [74] 74 (02/21 0536) Resp:  [16-17] 16 (02/21 0536) BP: (122-130)/(55-64) 130/64 (02/21 0536) SpO2:  [90 %-99 %] 99 % (02/21 0536)  Intake/Output from previous day: 02/20 0701 - 02/21 0700 In: 475 [P.O.:475] Out: 300 [Urine:300] Intake/Output this shift: No intake/output data recorded.  Recent Labs    06/13/20 0338 06/13/20 1038 06/14/20 0251  HGB 8.5* 8.3* 8.2*   Recent Labs    06/13/20 0338 06/13/20 1038 06/14/20 0251  WBC 10.4  --  9.2  RBC 2.67*  2.63*  --  2.46*  HCT 26.4* 25.9* 24.7*  PLT 150  --  165   Recent Labs    06/13/20 0338  NA 136  K 3.9  CL 104  CO2 26  BUN 20  CREATININE 0.65  GLUCOSE 88  CALCIUM 7.8*   No results for input(s): LABPT, INR in the last 72 hours.  Neurologically intact No results found.  Assessment/Plan: 4 Days Post-Op Procedure(s) (LRB): INTRAMEDULLARY (IM) NAIL FEMORAL (Left) Up with therapy. Once able to transfer to chair could go home with daughters. Nausea gone.   Alison Carpenter 06/14/2020, 8:47 AM

## 2020-06-15 DIAGNOSIS — R636 Underweight: Secondary | ICD-10-CM | POA: Diagnosis not present

## 2020-06-15 DIAGNOSIS — S72145A Nondisplaced intertrochanteric fracture of left femur, initial encounter for closed fracture: Secondary | ICD-10-CM | POA: Diagnosis not present

## 2020-06-15 DIAGNOSIS — R110 Nausea: Secondary | ICD-10-CM | POA: Diagnosis not present

## 2020-06-15 DIAGNOSIS — W19XXXA Unspecified fall, initial encounter: Secondary | ICD-10-CM | POA: Diagnosis not present

## 2020-06-15 LAB — HEMOGLOBIN AND HEMATOCRIT, BLOOD
HCT: 24.2 % — ABNORMAL LOW (ref 36.0–46.0)
HCT: 24 % — ABNORMAL LOW (ref 36.0–46.0)
Hemoglobin: 7.8 g/dL — ABNORMAL LOW (ref 12.0–15.0)
Hemoglobin: 8.3 g/dL — ABNORMAL LOW (ref 12.0–15.0)

## 2020-06-15 NOTE — Progress Notes (Signed)
   Subjective: 5 Days Post-Op Procedure(s) (LRB): INTRAMEDULLARY (IM) NAIL FEMORAL (Left) Patient reports pain as mild.    Objective: Vital signs in last 24 hours: Temp:  [97.8 F (36.6 C)-98.9 F (37.2 C)] 98.1 F (36.7 C) (02/22 0457) Pulse Rate:  [78-86] 79 (02/22 0457) Resp:  [16-18] 16 (02/22 0457) BP: (110-151)/(60-72) 151/70 (02/22 0457) SpO2:  [97 %-99 %] 97 % (02/22 0457)  Intake/Output from previous day: 02/21 0701 - 02/22 0700 In: 330 [P.O.:210; IV Piggyback:120] Out: 100 [Urine:100] Intake/Output this shift: No intake/output data recorded.  Recent Labs    06/13/20 0338 06/13/20 1038 06/14/20 0251 06/15/20 0330  HGB 8.5* 8.3* 8.2* 7.8*   Recent Labs    06/13/20 0338 06/13/20 1038 06/14/20 0251 06/15/20 0330  WBC 10.4  --  9.2  --   RBC 2.67*  2.63*  --  2.46*  --   HCT 26.4*   < > 24.7* 24.2*  PLT 150  --  165  --    < > = values in this interval not displayed.   Recent Labs    06/13/20 0338  NA 136  K 3.9  CL 104  CO2 26  BUN 20  CREATININE 0.65  GLUCOSE 88  CALCIUM 7.8*   No results for input(s): LABPT, INR in the last 72 hours.  Neurologically intact No results found.  Assessment/Plan: 5 Days Post-Op Procedure(s) (LRB): INTRAMEDULLARY (IM) NAIL FEMORAL (Left) Up with therapy. Hgb stable. If symptomatic with therapy , can consider transfusion .   Alison Carpenter 06/15/2020, 7:35 AM

## 2020-06-15 NOTE — Progress Notes (Addendum)
Patient ID: Alison Carpenter, female   DOB: December 08, 1917, 87 y.o.   MRN: [id number]    Progress Note   Subjective   day # 4 CC; anemia/heme + , drop in hgb post op  HGB 7.8 this am IV fereheme 2/21 No transfusions  Patient says she is feeling pretty good, no GI complaints, has not had another bowel movement since yesterday morning.   Objective   Vital signs in last 24 hours: Temp:  [97.8 F (36.6 C)-98.9 F (37.2 C)] 98.1 F (36.7 C) (02/22 0457) Pulse Rate:  [78-86] 79 (02/22 0457) Resp:  [16-18] 16 (02/22 0457) BP: (110-151)/(60-72) 151/70 (02/22 0457) SpO2:  [97 %-99 %] 97 % (02/22 0457) Last BM Date: 06/14/20 General: Very elderly white female in NAD Heart:  Regular rate and rhythm; no murmurs Lungs: Respirations even and unlabored, lungs CTA bilaterally Abdomen:  Soft, nontender and nondistended. Normal bowel sounds. Extremities:  Without edema. Neurologic:  Alert and oriented,  grossly normal neurologically. Psych:  Cooperative. Normal mood and affect.  Intake/Output from previous day: 02/21 0701 - 02/22 0700 In: 330 [P.O.:210; IV Piggyback:120] Out: 100 [Urine:100] Intake/Output this shift: No intake/output data recorded.  Lab Results: Recent Labs    06/13/20 0338 06/13/20 1038 06/14/20 0251 06/15/20 0330  WBC 10.4  --  9.2  --   HGB 8.5* 8.3* 8.2* 7.8*  HCT 26.4* 25.9* 24.7* 24.2*  PLT 150  --  165  --    BMET Recent Labs    06/13/20 0338  NA 136  K 3.9  CL 104  CO2 26  GLUCOSE 88  BUN 20  CREATININE 0.65  CALCIUM 7.8*   LFT Recent Labs    06/13/20 0338  ALBUMIN 2.7*   PT/INR No results for input(s): LABPROT, INR in the last 72 hours.       Assessment / Plan:    #44 87 year old white female admitted after fall with left hip fracture, status post left trochanteric nail repair June 10, 2020  #2 significant gradual drop in hemoglobin since admission, down 5 g without any evidence of overt GI bleeding.  Patient was noted to  be heme positive and iron deficient.  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 Suspect she may have intermittent chronic occult GI blood loss secondary to AVMs.  Cannot rule out other GI source for chronic GI blood loss/iron deficiency given last colonoscopy 2006.  Suspect drop in hemoglobin since admission is multifactorial secondary to volume status, surgical loss with hip repair, no evidence for any significant GI bleed  #3 status post hysterectomy, appendectomy, cholecystectomy, and remote sigmoid resection for diverticular disease  Plan; continue serial hemoglobins, transfuse 2 units for hemoglobin 7.5 or less Continue to monitor for evidence of overt GI bleeding Patient is not interested in endoscopic evaluation Repeat IV iron infusion in about 1 week Consider imaging of abdomen and hip for further drop in hemoglobin. GI will be available if needed. Active Problems:   Hip fracture (HCC)   Closed nondisplaced intertrochanteric fracture of left femur (HCC)     LOS: 5 days   Amy EsterwoodPA-C  06/15/2020, 8:39 AM  GI ATTENDING  Interval history and data reviewed. Patient seen and examined. Agree with interval progress note as outlined above. No GI bleeding. Agree with recommendations as outlined above. We are available if needed. We will sign off.  Wilhemina Bonito. Eda Keys., M.D. [facility] Division of  Gastroenterology

## 2020-06-15 NOTE — Progress Notes (Signed)
PROGRESS NOTE  Alison Carpenter TKZ:[id number] DOB: 06/30/1917   PCP: Burton Apley, MD  Patient is from: Home.  Independent for ADLs and IADLs.  Still driving and working.  DOA: 06/10/2020 LOS: 5  Chief complaints: Accidental fall and left hip pain  Brief Narrative / Interim history: 87 year old F with no significant PMH brought to ED due to left hip pain after accidental fall at her place of work, and admitted for left hip fracture.  She underwent left trochanteric nail by Dr. Ophelia Charter on 2/17.  Postop course complicated by ABLA and positive Hemoccult.  GI consulted and gave recommendations.  Patient is not interested endoscopic evaluation.  Monitoring H&H.  Subjective: Seen and examined earlier this morning.  No major events overnight of this morning.  No complaints.  Pain fairly controlled.  She has not had bowel movements since yesterday morning.  Denies chest pain, dyspnea, dizziness or palpitation.  Objective: Vitals:   06/14/20 2138 06/15/20 0457 06/15/20 1015 06/15/20 1150  BP: 110/60 (!) 151/70 137/74   Pulse: 78 79 83   Resp: 16 16 17    Temp: 98.9 F (37.2 C) 98.1 F (36.7 C) 99.2 F (37.3 C) 99.2 F (37.3 C)  TempSrc: Oral Oral Oral   SpO2: 97% 97% 92%   Weight:      Height:        Intake/Output Summary (Last 24 hours) at 06/15/2020 1408 Last data filed at 06/15/2020 1300 Gross per 24 hour  Intake 690 ml  Output 400 ml  Net 290 ml   Filed Weights   06/10/20 1624  Weight: 37.2 kg    Examination:  GENERAL: No apparent distress.  Nontoxic. HEENT: MMM.  Vision and hearing grossly intact.  NECK: Supple.  No apparent JVD.  RESP: On RA.  No IWOB.  Fair aeration bilaterally. CVS:  RRR. Heart sounds normal.  ABD/GI/GU: BS+. Abd soft, NTND.  MSK/EXT:  Moves extremities. No apparent deformity. No edema.  SKIN: Some bruising around surgical site but no fluctuance or hematoma. NEURO: Awake, alert and oriented appropriately.  No apparent focal neuro  deficit. PSYCH: Calm. Normal affect.  Procedures:  2/17-left trochanteric nailing for closed intertrochanteric hip fracture  Microbiology summarized: COVID-19 PCR nonreactive.  Assessment & Plan: Accidental fall at place of work Closed intertrochanteric left femoral fracture -S/p trochanteric nailing -On scheduled Tylenol with as needed tramadol per surgery. -Continue PT/OT/OOB -Encourage incentive spirometry  Acute blood loss anemia/iron deficiency anemia: hemoccult positive but no overt bleeding.  Definitely some element of hemodilution but this alone doesn't explain 5 g drop in Hgb.  She has history of AVM and diverticulosis on her colonoscopy 16 years ago.  Patient not interested in endoscopic evaluation. Recent Labs    06/10/20 1210 06/11/20 0312 06/13/20 0338 06/13/20 1038 06/14/20 0251 06/15/20 0330  HGB 13.2 11.7* 8.5* 8.3* 8.2* 7.8*  -Discontinued aspirin and Lovenox on 2/21 -IV Feraheme x1 on 2/21.  Additional dose in a week -P.o. ferrous sulfate on discharge -SCD for VTE prophylaxis -GI gave recommendations and signed off-see their note on 2/22. -Continue monitoring.  Nausea: Likely due to opiate.  Resolved.  Constipation: Resolved.  -Scheduled Colace with as needed MiraLAX and Senokot-S  Debility/physical deconditioning: driving and working prior to admission.  -OOB/PT/OT  Underweight Body mass index is 14.53 kg/m. Nutrition Problem: Increased nutrient needs Etiology: post-op healing  Signs/Symptoms: estimated needs Interventions: Magic cup,Ensure Surgery   DVT prophylaxis:  Place and maintain sequential compression device Start: 06/14/20 1529 SCDs Start: 06/10/20 2241  Code Status: Full code Family Communication: Updated patient's daughter over the phone on 2/21 Level of care: Med-Surg Status is: Inpatient  Remains inpatient appropriate because:Unsafe d/c plan and Inpatient level of care appropriate due to severity of illness   Dispo: The  patient is from: Home              Anticipated d/c is to: Home with home health once H&H stable              Anticipated d/c date is: 1 day              Patient currently is not medically stable to d/c.   Difficult to place patient No       Consultants:  Orthopedic surgery Gastroenterology-signed off   Sch Meds:  Scheduled Meds: . acetaminophen  650 mg Oral Q8H  . docusate sodium  100 mg Oral BID  . feeding supplement  237 mL Oral Q24H   Continuous Infusions:  PRN Meds:.menthol-cetylpyridinium **OR** phenol, metoCLOPramide **OR** metoCLOPramide (REGLAN) injection, ondansetron **OR** ondansetron (ZOFRAN) IV, polyethylene glycol, senna-docusate, traMADol  Antimicrobials: Anti-infectives (From admission, onward)   Start     Dose/Rate Route Frequency Ordered Stop   06/11/20 0600  ceFAZolin (ANCEF) IVPB 2g/100 mL premix        2 g 200 mL/hr over 30 Minutes Intravenous On call to O.R. 06/10/20 1958 06/10/20 1946       I have personally reviewed the following labs and images: CBC: Recent Labs  Lab 06/10/20 1210 06/11/20 0312 06/13/20 0338 06/13/20 1038 06/14/20 0251 06/15/20 0330  WBC 9.6 13.7* 10.4  --  9.2  --   NEUTROABS 7.9*  --   --   --   --   --   HGB 13.2 11.7* 8.5* 8.3* 8.2* 7.8*  HCT 40.7 36.2 26.4* 25.9* 24.7* 24.2*  MCV 98.1 99.2 98.9  --  100.4*  --   PLT 229 195 150  --  165  --    BMP &GFR Recent Labs  Lab 06/10/20 1210 06/11/20 0312 06/13/20 0338  NA 141 138 136  K 3.5 3.5 3.9  CL 103 103 104  CO2 28 25 26   GLUCOSE 98 123* 88  BUN 13 12 20   CREATININE 0.58 0.55 0.65  CALCIUM 8.8* 8.0* 7.8*  MG  --   --  1.8  PHOS  --   --  2.5   Estimated Creatinine Clearance: 22 mL/min (by C-G formula based on SCr of 0.65 mg/dL). Liver & Pancreas: Recent Labs  Lab 06/13/20 0338  ALBUMIN 2.7*   No results for input(s): LIPASE, AMYLASE in the last 168 hours. No results for input(s): AMMONIA in the last 168 hours. Diabetic: No results for  input(s): HGBA1C in the last 72 hours. No results for input(s): GLUCAP in the last 168 hours. Cardiac Enzymes: No results for input(s): CKTOTAL, CKMB, CKMBINDEX, TROPONINI in the last 168 hours. No results for input(s): PROBNP in the last 8760 hours. Coagulation Profile: Recent Labs  Lab 06/10/20 1210  INR 1.1   Thyroid Function Tests: No results for input(s): TSH, T4TOTAL, FREET4, T3FREE, THYROIDAB in the last 72 hours. Lipid Profile: No results for input(s): CHOL, HDL, LDLCALC, TRIG, CHOLHDL, LDLDIRECT in the last 72 hours. Anemia Panel: Recent Labs    06/13/20 0338  VITAMINB12 408  FOLATE 8.5  FERRITIN 121  TIBC 192*  IRON 18*  RETICCTPCT 1.4   Urine analysis: No results found for: COLORURINE, APPEARANCEUR, LABSPEC, PHURINE, GLUCOSEU, HGBUR, BILIRUBINUR, KETONESUR,  PROTEINUR, UROBILINOGEN, NITRITE, LEUKOCYTESUR Sepsis Labs: Invalid input(s): PROCALCITONIN, LACTICIDVEN  Microbiology: Recent Results (from the past 240 hour(s))  Resp Panel by RT-PCR (Flu A&B, Covid) Nasopharyngeal Swab     Status: None   Collection Time: 06/10/20 11:45 AM   Specimen: Nasopharyngeal Swab; Nasopharyngeal(NP) swabs in vial transport medium  Result Value Ref Range Status   SARS Coronavirus 2 by RT PCR NEGATIVE NEGATIVE Final    Comment: (NOTE) SARS-CoV-2 target nucleic acids are NOT DETECTED.  The SARS-CoV-2 RNA is generally detectable in upper respiratory specimens during the acute phase of infection. The lowest concentration of SARS-CoV-2 viral copies this assay can detect is 138 copies/mL. A negative result does not preclude SARS-Cov-2 infection and should not be used as the sole basis for treatment or other patient management decisions. A negative result may occur with  improper specimen collection/handling, submission of specimen other than nasopharyngeal swab, presence of viral mutation(s) within the areas targeted by this assay, and inadequate number of viral copies(<138  copies/mL). A negative result must be combined with clinical observations, patient history, and epidemiological information. The expected result is Negative.  Fact Sheet for Patients:  BloggerCourse.com  Fact Sheet for Healthcare Providers:  SeriousBroker.it  This test is no t yet approved or cleared by the [place] FDA and  has been authorized for detection and/or diagnosis of SARS-CoV-2 by FDA under an Emergency Use Authorization (EUA). This EUA will remain  in effect (meaning this test can be used) for the duration of the COVID-19 declaration under Section 564(b)(1) of the Act, 21 U.S.C.section 360bbb-3(b)(1), unless the authorization is terminated  or revoked sooner.       Influenza A by PCR NEGATIVE NEGATIVE Final   Influenza B by PCR NEGATIVE NEGATIVE Final    Comment: (NOTE) The Xpert Xpress SARS-CoV-2/FLU/RSV plus assay is intended as an aid in the diagnosis of influenza from Nasopharyngeal swab specimens and should not be used as a sole basis for treatment. Nasal washings and aspirates are unacceptable for Xpert Xpress SARS-CoV-2/FLU/RSV testing.  Fact Sheet for Patients: BloggerCourse.com  Fact Sheet for Healthcare Providers: SeriousBroker.it  This test is not yet approved or cleared by the [place] FDA and has been authorized for detection and/or diagnosis of SARS-CoV-2 by FDA under an Emergency Use Authorization (EUA). This EUA will remain in effect (meaning this test can be used) for the duration of the COVID-19 declaration under Section 564(b)(1) of the Act, 21 U.S.C. section 360bbb-3(b)(1), unless the authorization is terminated or revoked.  Performed at [facility], [address], [place], [place] [address]     Radiology Studies: No results found.    Taye T. Gonfa [facility]  If 7PM-7AM, please contact  night-coverage [email or web address] 06/15/2020, 2:07 PM

## 2020-06-15 NOTE — Progress Notes (Signed)
Occupational Therapy Treatment Patient Details Name: Alison Carpenter MRN: [id number] DOB: 04-14-1918 Today's Date: 06/15/2020    History of present illness Pt s/p fall with L hip fx and now s/p IM nail. no significant PMH   OT comments  Unfortunately pt fatigued from 7.8 Hgb today, irritable with all OT interventions and refusing most teachings. Patient currently seems to lack understanding of current impairments and importance of rehab.  Pt contradicting herself at ties insisting that she has a goal to regain independence but stating that she does not want OT because her family will care for her at home.  Pt's daughter asked pt to clarify this and pt appeared overwhelmed and stated to daughter, "you tell me what to do."  No observable progess with ADLs observed today as pt refusing to engage in LE dressing, grooming at sink or toileting per OT goals. Patient limited by fatigue from anemia, LT hip pain, and decreased awareness of impairments, along with deficits noted below. Pt continues to demonstrate good rehab potential and would benefit from continued skilled OT to increase safety and independence with ADLs and functional transfers to allow pt to return home safely and reduce caregiver burden and fall risk.  Pt did agree with encouragement to remain on OT service in order to continue to work toward independence.    Follow Up Recommendations  Home health OT;Supervision/Assistance - 24 hour;Other (comment) (vs short term rehab if pt does not have 24 hour supervision and assistance)    Equipment Recommendations  3 in 1 bedside commode;Other (comment)    Recommendations for Other Services      Precautions / Restrictions Precautions Precautions: Fall Restrictions Weight Bearing Restrictions: Yes LLE Weight Bearing: Partial weight bearing LLE Partial Weight Bearing Percentage or Pounds: 50% WB       Mobility Bed Mobility Overal bed mobility: Needs Assistance Bed Mobility: Sit to  Supine       Sit to supine: Min guard   General bed mobility comments: Total Assist of 2 tro scoot posteriorly in supine.    Transfers Overall transfer level: Needs assistance Equipment used: Rolling walker (2 wheeled) Transfers: Sit to/from [employer] Sit to Stand: Min assist;From elevated surface Stand pivot transfers: Min assist       General transfer comment: Pt taking slow, hopping steps during pivot with Min As and use of RW. Increased effort. Pt fatigued from low Hgb of 7.8    Balance Overall balance assessment: Needs assistance Sitting-balance support: No upper extremity supported;Feet supported Sitting balance-Leahy Scale: Fair     Standing balance support: Bilateral upper extremity supported Standing balance-Leahy Scale: Poor Standing balance comment: reliant on UEs and external assist                           ADL either performed or assessed with clinical judgement   ADL Overall ADL's : Needs assistance/impaired                     Lower Body Dressing: Moderate assistance;Sit to/from stand;Sitting/lateral leans Lower Body Dressing Details (indicate cue type and reason): Attempted to educate pt oin adaptive equipemtn uses to increase independence, but pt adamantly refused to attempt stating "people will ake care of me at home."  Pt then denied that she wanted to be dependent when asked, and pt's daugher clarified that the pt takes care of a disabled daughter and does not have 24 hour assistance. Therefore reattempted education, to  which pt again refused. Pt was educated to dress surgical leg first for ease and pt replied, "I could have figured that out on my own."             Functional mobility during ADLs: Minimal assistance;Rolling walker;Cueing for sequencing       Vision       Perception     Praxis      Cognition Arousal/Alertness: Awake/alert Behavior During Therapy: Agitated Overall Cognitive Status:  Within Functional Limits for tasks assessed                                 General Comments: Hard of hearing        Exercises     Shoulder Instructions       General Comments      Pertinent Vitals/ Pain       Pain Assessment: 0-10 Pain Score: 5  Pain Location: L hip Pain Descriptors / Indicators: Aching;Sore Pain Intervention(s): Limited activity within patient's tolerance;Monitored during session;Repositioned  Home Living                                          Prior Functioning/Environment              Frequency           Progress Toward Goals  OT Goals(current goals can now be found in the care plan section)  Progress towards OT goals: Not progressing toward goals - comment (See Clinical impression in note.)     Plan Discharge plan remains appropriate    Co-evaluation                 AM-PAC OT "6 Clicks" Daily Activity     Outcome Measure   Help from another person eating meals?: None Help from another person taking care of personal grooming?: A Little Help from another person toileting, which includes using toliet, bedpan, or urinal?: A Lot Help from another person bathing (including washing, rinsing, drying)?: A Lot Help from another person to put on and taking off regular upper body clothing?: A Little Help from another person to put on and taking off regular lower body clothing?: A Lot 6 Click Score: 16    End of Session Equipment Utilized During Treatment: Gait belt;Rolling walker  OT Visit Diagnosis: Unsteadiness on feet (R26.81);History of falling (Z91.81);Pain   Activity Tolerance Patient limited by fatigue   Patient Left in bed;with call bell/phone within reach;with bed alarm set;with family/visitor present;with SCD's reapplied   Nurse Communication Other (comment) (RN aware of OT in room and locked IV during beginning of OT.)        Time: 1345-1410 OT Time Calculation (min): 25  min  Charges: OT General Charges $OT Visit: 1 Visit OT Treatments $Self Care/Home Management : 8-22 mins $Therapeutic Activity: 8-22 mins  Victorino Dike, OT Acute Rehab Services Office: [phone] 06/15/2020  Theodoro Clock 06/15/2020, 2:26 PM

## 2020-06-15 NOTE — Progress Notes (Signed)
I have reviewed and concur with students documentation.  

## 2020-06-16 DIAGNOSIS — R5381 Other malaise: Secondary | ICD-10-CM

## 2020-06-16 DIAGNOSIS — R636 Underweight: Secondary | ICD-10-CM

## 2020-06-16 DIAGNOSIS — D509 Iron deficiency anemia, unspecified: Secondary | ICD-10-CM

## 2020-06-16 DIAGNOSIS — S72145A Nondisplaced intertrochanteric fracture of left femur, initial encounter for closed fracture: Secondary | ICD-10-CM | POA: Diagnosis not present

## 2020-06-16 LAB — HEMOGLOBIN AND HEMATOCRIT, BLOOD
HCT: 26 % — ABNORMAL LOW (ref 36.0–46.0)
Hemoglobin: 8.5 g/dL — ABNORMAL LOW (ref 12.0–15.0)

## 2020-06-16 MED ORDER — FERROUS SULFATE 325 (65 FE) MG PO TABS: 325.0000 mg | ORAL_TABLET | Freq: Every day | ORAL | 0 refills | Status: DC

## 2020-06-16 NOTE — Progress Notes (Signed)
Physical Therapy Treatment Patient Details Name: Alison Carpenter MRN: [id number] DOB: [birth year] Today's Date: 06/16/2020    History of Present Illness Pt s/p fall with L hip fx and now s/p IM nail. no significant PMH    PT Comments    POD # 6 Assisted OOB to amb to bathroom, assisted in bathroom then amb to recliner.  Session limited by fatigue but age appropriate.  Pt plans to return home with full family support.    Follow Up Recommendations  Home health PT;SNF     Equipment Recommendations  None recommended by PT    Recommendations for Other Services       Precautions / Restrictions Precautions Precautions: Fall Restrictions Weight Bearing Restrictions: Yes LLE Weight Bearing: Partial weight bearing LLE Partial Weight Bearing Percentage or Pounds: 50%    Mobility  Bed Mobility Overal bed mobility: Needs Assistance Bed Mobility: Supine to Sit     Supine to sit: Supervision;Min guard     General bed mobility comments: increased time    Transfers Overall transfer level: Needs assistance Equipment used: Rolling walker (2 wheeled) Transfers: Sit to/from Stand Sit to Stand: Min guard Stand pivot transfers: Min guard;Min assist       General transfer comment: VC's on proper hand placement and safety with turns also assisted  with a toilet transfer  Ambulation/Gait Ambulation/Gait assistance: Min assist Gait Distance (Feet): 22 Feet (11 feet x 2 to and from bathroom) Assistive device: Rolling walker (2 wheeled) Gait Pattern/deviations: Step-to pattern;Shuffle;Decreased step length - left;Decreased stance time - left;Trunk flexed Gait velocity: decr   General Gait Details: cues for sequence, posture, position from RW and increased WB to insure PWB and  physical assist for balance/support and RW management   Stairs             Wheelchair Mobility    Modified Rankin (Stroke Patients Only)       Balance                                             Cognition Arousal/Alertness: Awake/alert Behavior During Therapy: WFL for tasks assessed/performed Overall Cognitive Status: Within Functional Limits for tasks assessed                                 General Comments: AxOx3very sweet and very HOH      Exercises      General Comments        Pertinent Vitals/Pain Pain Assessment: 0-10 Pain Score: 3  Pain Location: L hip Pain Descriptors / Indicators: Aching;Sore Pain Intervention(s): Monitored during session;Repositioned;Ice applied    Home Living                      Prior Function            PT Goals (current goals can now be found in the care plan section) Progress towards PT goals: Progressing toward goals    Frequency    Min 5X/week      PT Plan Current plan remains appropriate    Co-evaluation              AM-PAC PT "6 Clicks" Mobility   Outcome Measure  Help needed turning from your back to your side while in a flat bed without using bedrails?: A  Lot Help needed moving from lying on your back to sitting on the side of a flat bed without using bedrails?: A Lot Help needed moving to and from a bed to a chair (including a wheelchair)?: A Little Help needed standing up from a chair using your arms (e.g., wheelchair or bedside chair)?: A Little Help needed to walk in hospital room?: A Little Help needed climbing 3-5 steps with a railing? : A Lot 6 Click Score: 15    End of Session Equipment Utilized During Treatment: Gait belt Activity Tolerance: Patient limited by fatigue Patient left: in bed;with call bell/phone within reach;with bed alarm set;with family/visitor present Nurse Communication: Mobility status PT Visit Diagnosis: History of falling (Z91.81);Difficulty in walking, not elsewhere classified (R26.2);Pain Pain - Right/Left: Left Pain - part of body: Hip     Time: 0915-0940 PT Time Calculation (min) (ACUTE ONLY): 25 min  Charges:   $Gait Training: 8-22 mins $Therapeutic Activity: 8-22 mins                     Felecia Shelling  PTA Acute  Rehabilitation Services Pager      [phone] Office      [phone]

## 2020-06-16 NOTE — TOC Transition Note (Signed)
Transition of Care [facility]) - CM/SW Discharge Note   Patient Details  Name: Alison Carpenter MRN: [id number] Date of Birth: Jun 20, 1917  Transition of Care [facility]) CM/SW Contact:  Clearance Coots, LCSW Phone Number: 06/16/2020, 11:00 AM   Clinical Narrative:    The Physical therapist recommends a youth RW instead of the standard RW. CSW reached out to [facility] and was notified they do not have youth RW 's in stock at the warehouse that can be delivered to the patient bedside, however they do have them at retail store in [place]. CSW notified the patient and her daughter and provided directions to the store. Daughter appreciative of CSW intervention during their stay. CSW alerted the [facility] ([facility]).  Final next level of care: Home w Home Health Services Barriers to Discharge: Barriers Resolved   Patient Goals and CMS Choice   CMS Medicare.gov Compare Post Acute Care list provided to:: Patient Choice offered to / list presented to : Patient,Adult Children  Discharge Placement                       Discharge Plan and Services In-house Referral: Clinical Social Work Discharge Planning Services: CM Consult Post Acute Care Choice: Home Health          DME Arranged: Dan Humphreys rolling DME Agency: [facility]       HH Arranged: PT,OT,Nurse's Aide HH Agency: [facility] ([facility] Date HH Agency Contacted: 06/11/20 Time HH Agency Contacted: 1101 Representative spoke with at [facility] Agency: Pearson Grippe  Social Determinants of Health (SDOH) Interventions     Readmission Risk Interventions No flowsheet data found.

## 2020-06-16 NOTE — Discharge Summary (Addendum)
Physician Discharge Summary  GENESSIS Carpenter MBW:[id number] DOB: 1918/01/14 DOA: 06/10/2020  PCP: Burton Apley, MD  Admit date: 06/10/2020 Discharge date: 06/16/2020  Admitted From: Home Disposition:  Home with home health   Recommendations for Outpatient Follow-up:  1. Follow up with PCP in 1 week 2. Follow up with Orthopedic surgery in 2 weeks  3. Holding baby ASA for a couple weeks then restart. Repeat Hgb in 1 week   Discharge Condition: Stable CODE STATUS: Full  Diet recommendation: Regular   Brief/Interim Summary: From H&P by Dr. Ronaldo Miyamoto: "HPI: Alison Carpenter is a 87 y.o. female with no significant medical history. She is presenting with hip pain after fall. She reports that she went to work this morning. She was having some problems with some equipment at her desk. She stood up from her desk and stumbled backwards as her shoe got caught in the carpet. She fell to her left side. She denies any head injury or LOC. She remembers the entire fall. She tried to get up and was only partially able to make it before she was assisted to a chair. She found that it was painful to put pressure on her left side, so she was transported to the ED. She denies any or aggravating or alleviating factors.   ED Course: XR showed left hip fracture. Ortho was consulted. They will try to fix her in surgery schedule this evening. TRH was called for admission."  Interim: Patient underwent surgical fixation on 2/17. Post-operatively, her hgb dropped. Hemoccult was positive and GI was consulted. Suspect she may have intermittent chronic occult GI blood loss secondary to AVMs.  Cannot rule out other GI source for chronic GI blood loss/iron deficiency given last colonoscopy 2006. Patient declined endoscopic evaluation. Discontinued aspirin and Lovenox on 2/21 and patient received IV feraheme.   PT recommended home health and patient was discharged in stable condition with 24 hour family  supervision.  Discharge Diagnoses:  Principal Problem:   Closed nondisplaced intertrochanteric fracture of left femur (HCC) Active Problems:   Iron deficiency anemia   Underweight   Physical deconditioning   Discharge Instructions  Discharge Instructions    Call MD for:  difficulty breathing, headache or visual disturbances   Complete by: As directed    Call MD for:  extreme fatigue   Complete by: As directed    Call MD for:  persistant dizziness or light-headedness   Complete by: As directed    Call MD for:  persistant nausea and vomiting   Complete by: As directed    Call MD for:  redness, tenderness, or signs of infection (pain, swelling, redness, odor or green/yellow discharge around incision site)   Complete by: As directed    Call MD for:  severe uncontrolled pain   Complete by: As directed    Call MD for:  temperature >100.4   Complete by: As directed    Discharge instructions   Complete by: As directed    You were cared for by a hospitalist during your hospital stay. If you have any questions about your discharge medications or the care you received while you were in the hospital after you are discharged, you can call the unit and ask to speak with the hospitalist on call if the hospitalist that took care of you is not available. Once you are discharged, your primary care physician will handle any further medical issues. Please note that NO REFILLS for any discharge medications will be authorized once you are discharged,  as it is imperative that you return to your primary care physician (or establish a relationship with a primary care physician if you do not have one) for your aftercare needs so that they can reassess your need for medications and monitor your lab values.   Increase activity slowly   Complete by: As directed    Leave dressing on - Keep it clean, dry, and intact until clinic visit   Complete by: As directed    Partial weight bearing   Complete by: As  directed    % Body Weight: 50%   Laterality: left   Extremity: Lower     Allergies as of 06/16/2020   No Known Allergies     Medication List    STOP taking these medications   aspirin EC 81 MG tablet     TAKE these medications   ferrous sulfate 325 (65 FE) MG tablet Take 1 tablet (325 mg total) by mouth daily.   traMADol 50 MG tablet Commonly known as: Ultram Take 1 tablet (50 mg total) by mouth every 12 (twelve) hours as needed.            Durable Medical Equipment  (From admission, onward)         Start     Ordered   06/11/20 1123  For home use only DME Walker rolling  Once       Question Answer Comment  Walker: With 5 Inch Wheels   Patient needs a walker to treat with the following condition S/p left hip fracture      06/11/20 1123           Discharge Care Instructions  (From admission, onward)         Start     Ordered   06/16/20 0000  Leave dressing on - Keep it clean, dry, and intact until clinic visit        06/16/20 1004   06/14/20 0000  Partial weight bearing       Question Answer Comment  % Body Weight 50%   Laterality left   Extremity Lower      06/14/20 0851          Follow-up Information    Eldred Manges, MD. Schedule an appointment as soon as possible for a visit in 1 week(s).   Specialty: Orthopedic Surgery Why: needs return office visit 2 weeks postop Contact information: [address] [place] [address] [phone]        [facility] Follow up.   Specialty: Home Health Services Why: [facility]  [phone] This agency will provide Home Physical Therapy Services       Burton Apley, MD. Schedule an appointment as soon as possible for a visit in 1 week(s).   Specialty: Internal Medicine Contact information: [address] [place] [address] [phone]              No Known Allergies  Consultations:  Orthopedic  surgery  GI   Procedures/Studies: DG C-Arm 1-60 Min-No Report  Result Date: 06/10/2020 Fluoroscopy was utilized by the requesting physician.  No radiographic interpretation.   DG Hip Unilat W or Wo Pelvis 2-3 Views Left  Result Date: 06/10/2020 CLINICAL DATA:  Fall. EXAM: DG HIP (WITH OR WITHOUT PELVIS) 2-3V LEFT COMPARISON:  None. FINDINGS: Acute nondisplaced left intertrochanteric femur fracture. No dislocation. Mild bilateral hip joint space narrowing with small marginal osteophytes. Osteopenia. Prior hernia repair. IMPRESSION: 1. Acute nondisplaced left  intertrochanteric femur fracture. Electronically Signed   By: Obie DredgeWilliam T Derry M.D.   On: 06/10/2020 11:40   DG FEMUR MIN 2 VIEWS LEFT  Result Date: 06/10/2020 CLINICAL DATA:  Left femoral fracture repair EXAM: LEFT FEMUR 2 VIEWS COMPARISON:  06/10/2020 FINDINGS: Three fluoroscopic images are obtained during the performance of the procedure and are provided for interpretation only. Intramedullary rod with proximal dynamic screw and distal interlocking screw traverses the intertrochanteric left hip fracture seen previously. Alignment is anatomic. Please refer to the operative report. IMPRESSION: 1. ORIF of an intertrochanteric left hip fracture with near anatomic alignment. Electronically Signed   By: Sharlet SalinaMichael  Brown M.D.   On: 06/10/2020 21:31       Discharge Exam: Vitals:   06/15/20 2239 06/16/20 0525  BP: 131/71 (!) 150/79  Pulse: 79 87  Resp: 16 16  Temp: 98.3 F (36.8 C) 98.5 F (36.9 C)  SpO2: 91% 95%    General: Pt is alert, awake, not in acute distress Cardiovascular: RRR, S1/S2 +, no edema Respiratory: CTA bilaterally, no wheezing, no rhonchi, no respiratory distress, no conversational dyspnea  Abdominal: Soft, NT, ND, bowel sounds + Extremities: no edema, no cyanosis Psych: Normal mood and affect, stable judgement and insight     The results of significant diagnostics from this hospitalization (including imaging,  microbiology, ancillary and laboratory) are listed below for reference.     Microbiology: Recent Results (from the past 240 hour(s))  Resp Panel by RT-PCR (Flu A&B, Covid) Nasopharyngeal Swab     Status: None   Collection Time: 06/10/20 11:45 AM   Specimen: Nasopharyngeal Swab; Nasopharyngeal(NP) swabs in vial transport medium  Result Value Ref Range Status   SARS Coronavirus 2 by RT PCR NEGATIVE NEGATIVE Final    Comment: (NOTE) SARS-CoV-2 target nucleic acids are NOT DETECTED.  The SARS-CoV-2 RNA is generally detectable in upper respiratory specimens during the acute phase of infection. The lowest concentration of SARS-CoV-2 viral copies this assay can detect is 138 copies/mL. A negative result does not preclude SARS-Cov-2 infection and should not be used as the sole basis for treatment or other patient management decisions. A negative result may occur with  improper specimen collection/handling, submission of specimen other than nasopharyngeal swab, presence of viral mutation(s) within the areas targeted by this assay, and inadequate number of viral copies(<138 copies/mL). A negative result must be combined with clinical observations, patient history, and epidemiological information. The expected result is Negative.  Fact Sheet for Patients:  BloggerCourse.comhttps://www.fda.gov/media/152166/download  Fact Sheet for Healthcare Providers:  SeriousBroker.ithttps://www.fda.gov/media/152162/download  This test is no t yet approved or cleared by the [place] FDA and  has been authorized for detection and/or diagnosis of SARS-CoV-2 by FDA under an Emergency Use Authorization (EUA). This EUA will remain  in effect (meaning this test can be used) for the duration of the COVID-19 declaration under Section 564(b)(1) of the Act, 21 U.S.C.section 360bbb-3(b)(1), unless the authorization is terminated  or revoked sooner.       Influenza A by PCR NEGATIVE NEGATIVE Final   Influenza B by PCR NEGATIVE NEGATIVE  Final    Comment: (NOTE) The Xpert Xpress SARS-CoV-2/FLU/RSV plus assay is intended as an aid in the diagnosis of influenza from Nasopharyngeal swab specimens and should not be used as a sole basis for treatment. Nasal washings and aspirates are unacceptable for Xpert Xpress SARS-CoV-2/FLU/RSV testing.  Fact Sheet for Patients: BloggerCourse.comhttps://www.fda.gov/media/152166/download  Fact Sheet for Healthcare Providers: SeriousBroker.ithttps://www.fda.gov/media/152162/download  This test is not yet approved or cleared by the  [place] [employer] trader and has been authorized for detection and/or diagnosis of SARS-CoV-2 by FDA under an TEFL teacher (EUA). This EUA will remain in effect (meaning this test can be used) for the duration of the COVID-19 declaration under Section 564(b)(1) of the Act, 21 U.S.C. section 360bbb-3(b)(1), unless the authorization is terminated or revoked.  Performed at [facility], [address], [place], [place] [address]      Labs: BNP (last 3 results) No results for input(s): BNP in the last 8760 hours. Basic Metabolic Panel: Recent Labs  Lab 06/10/20 1210 06/11/20 0312 06/13/20 0338  NA 141 138 136  K 3.5 3.5 3.9  CL 103 103 104  CO2 28 25 26   GLUCOSE 98 123* 88  BUN 13 12 20   CREATININE 0.58 0.55 0.65  CALCIUM 8.8* 8.0* 7.8*  MG  --   --  1.8  PHOS  --   --  2.5   Liver Function Tests: Recent Labs  Lab 06/13/20 0338  ALBUMIN 2.7*   No results for input(s): LIPASE, AMYLASE in the last 168 hours. No results for input(s): AMMONIA in the last 168 hours. CBC: Recent Labs  Lab 06/10/20 1210 06/11/20 0312 06/13/20 0338 06/13/20 1038 06/14/20 0251 06/15/20 0330 06/15/20 1454 06/16/20 0339  WBC 9.6 13.7* 10.4  --  9.2  --   --   --   NEUTROABS 7.9*  --   --   --   --   --   --   --   HGB 13.2 11.7* 8.5* 8.3* 8.2* 7.8* 8.3* 8.5*  HCT 40.7 36.2 26.4* 25.9* 24.7* 24.2* 24.0* 26.0*  MCV 98.1 99.2 98.9  --  100.4*  --   --   --   PLT  229 195 150  --  165  --   --   --    Cardiac Enzymes: No results for input(s): CKTOTAL, CKMB, CKMBINDEX, TROPONINI in the last 168 hours. BNP: Invalid input(s): POCBNP CBG: No results for input(s): GLUCAP in the last 168 hours. D-Dimer No results for input(s): DDIMER in the last 72 hours. Hgb A1c No results for input(s): HGBA1C in the last 72 hours. Lipid Profile No results for input(s): CHOL, HDL, LDLCALC, TRIG, CHOLHDL, LDLDIRECT in the last 72 hours. Thyroid function studies No results for input(s): TSH, T4TOTAL, T3FREE, THYROIDAB in the last 72 hours.  Invalid input(s): FREET3 Anemia work up No results for input(s): VITAMINB12, FOLATE, FERRITIN, TIBC, IRON, RETICCTPCT in the last 72 hours. Urinalysis No results found for: COLORURINE, APPEARANCEUR, LABSPEC, PHURINE, GLUCOSEU, HGBUR, BILIRUBINUR, KETONESUR, PROTEINUR, UROBILINOGEN, NITRITE, LEUKOCYTESUR Sepsis Labs Invalid input(s): PROCALCITONIN,  WBC,  LACTICIDVEN Microbiology Recent Results (from the past 240 hour(s))  Resp Panel by RT-PCR (Flu A&B, Covid) Nasopharyngeal Swab     Status: None   Collection Time: 06/10/20 11:45 AM   Specimen: Nasopharyngeal Swab; Nasopharyngeal(NP) swabs in vial transport medium  Result Value Ref Range Status   SARS Coronavirus 2 by RT PCR NEGATIVE NEGATIVE Final    Comment: (NOTE) SARS-CoV-2 target nucleic acids are NOT DETECTED.  The SARS-CoV-2 RNA is generally detectable in upper respiratory specimens during the acute phase of infection. The lowest concentration of SARS-CoV-2 viral copies this assay can detect is 138 copies/mL. A negative result does not preclude SARS-Cov-2 infection and should not be used as the sole basis for treatment or other patient management decisions. A negative result may occur with  improper specimen collection/handling, submission of specimen other than nasopharyngeal swab, presence of  viral mutation(s) within the areas targeted by this assay, and  inadequate number of viral copies(<138 copies/mL). A negative result must be combined with clinical observations, patient history, and epidemiological information. The expected result is Negative.  Fact Sheet for Patients:  BloggerCourse.com  Fact Sheet for Healthcare Providers:  SeriousBroker.it  This test is no t yet approved or cleared by the [place] FDA and  has been authorized for detection and/or diagnosis of SARS-CoV-2 by FDA under an Emergency Use Authorization (EUA). This EUA will remain  in effect (meaning this test can be used) for the duration of the COVID-19 declaration under Section 564(b)(1) of the Act, 21 U.S.C.section 360bbb-3(b)(1), unless the authorization is terminated  or revoked sooner.       Influenza A by PCR NEGATIVE NEGATIVE Final   Influenza B by PCR NEGATIVE NEGATIVE Final    Comment: (NOTE) The Xpert Xpress SARS-CoV-2/FLU/RSV plus assay is intended as an aid in the diagnosis of influenza from Nasopharyngeal swab specimens and should not be used as a sole basis for treatment. Nasal washings and aspirates are unacceptable for Xpert Xpress SARS-CoV-2/FLU/RSV testing.  Fact Sheet for Patients: BloggerCourse.com  Fact Sheet for Healthcare Providers: SeriousBroker.it  This test is not yet approved or cleared by the [place] FDA and has been authorized for detection and/or diagnosis of SARS-CoV-2 by FDA under an Emergency Use Authorization (EUA). This EUA will remain in effect (meaning this test can be used) for the duration of the COVID-19 declaration under Section 564(b)(1) of the Act, 21 U.S.C. section 360bbb-3(b)(1), unless the authorization is terminated or revoked.  Performed at [facility], [address] [place], [place] [address]      Patient was seen and examined on the day of discharge and was found to be  in stable condition. Time coordinating discharge: 35 minutes including assessment and coordination of care, as well as examination of the patient.   SIGNED:  Noralee Stain, DO [facility] 06/16/2020, 10:09 AM

## 2020-06-16 NOTE — Progress Notes (Signed)
Patient discharged to home w/ family. Given all belongings, instructions, equipment. Verbalized understanding of instructions. Escorted to pov via w/c. 

## 2020-06-16 NOTE — Progress Notes (Signed)
   Subjective: 6 Days Post-Op Procedure(s) (LRB): INTRAMEDULLARY (IM) NAIL FEMORAL (Left) Patient reports pain as mild.    Objective: Vital signs in last 24 hours: Temp:  [98.3 F (36.8 C)-99.2 F (37.3 C)] 98.5 F (36.9 C) (02/23 0525) Pulse Rate:  [79-87] 87 (02/23 0525) Resp:  [16-17] 16 (02/23 0525) BP: (123-150)/(69-79) 150/79 (02/23 0525) SpO2:  [91 %-96 %] 95 % (02/23 0525)  Intake/Output from previous day: 02/22 0701 - 02/23 0700 In: 810 [P.O.:810] Out: 400 [Urine:400] Intake/Output this shift: No intake/output data recorded.  Recent Labs    06/13/20 1038 06/14/20 0251 06/15/20 0330 06/15/20 1454 06/16/20 0339  HGB 8.3* 8.2* 7.8* 8.3* 8.5*   Recent Labs    06/14/20 0251 06/15/20 0330 06/15/20 1454 06/16/20 0339  WBC 9.2  --   --   --   RBC 2.46*  --   --   --   HCT 24.7*   < > 24.0* 26.0*  PLT 165  --   --   --    < > = values in this interval not displayed.   No results for input(s): NA, K, CL, CO2, BUN, CREATININE, GLUCOSE, CALCIUM in the last 72 hours. No results for input(s): LABPT, INR in the last 72 hours.  Neurologically intact No results found.  Assessment/Plan: 6 Days Post-Op Procedure(s) (LRB): INTRAMEDULLARY (IM) NAIL FEMORAL (Left) Up with therapy. Hgb stable and up to 8.5 from 8.3.  OK for discharge home on No DVT prophylaxis due to GI bleed. Consider holding baby ASA for a couple weeks then restart.   Alison Carpenter 06/16/2020, 7:31 AM

## 2020-06-21 ENCOUNTER — Telehealth: Payer: Self-pay | Admitting: Orthopaedic Surgery

## 2020-06-21 NOTE — Telephone Encounter (Signed)
Ok thanks 

## 2020-06-21 NOTE — Telephone Encounter (Signed)
I left voicemail with verbal orders. 

## 2020-06-21 NOTE — Telephone Encounter (Signed)
Ok for orders? 

## 2020-06-21 NOTE — Telephone Encounter (Signed)
Kelly from [facility] called. She would like PT orders for 1wk 4x. Her call back number is [phone]

## 2020-06-24 ENCOUNTER — Encounter: Payer: Self-pay | Admitting: Surgery

## 2020-06-24 ENCOUNTER — Ambulatory Visit (INDEPENDENT_AMBULATORY_CARE_PROVIDER_SITE_OTHER): Payer: Medicare Other | Admitting: Surgery

## 2020-06-24 ENCOUNTER — Other Ambulatory Visit: Payer: Self-pay

## 2020-06-24 ENCOUNTER — Ambulatory Visit: Payer: Self-pay

## 2020-06-24 VITALS — Ht 63.0 in | Wt 82.0 lb

## 2020-06-24 DIAGNOSIS — S72145A Nondisplaced intertrochanteric fracture of left femur, initial encounter for closed fracture: Secondary | ICD-10-CM

## 2020-06-24 NOTE — Progress Notes (Signed)
87 year old patient who is 2 weeks status post ORIF left hip comes in for recheck.  Patient lives at home with family.  She has been making good progress with this point.  Has been doing really well with 50% partial weightbearing restrictions.    Exam Extremely pleasant elderly female who looks much younger than her stated age.  Left hip wounds look good.  Staples removed and Steri-Strips applied.  No drainage or signs of infection.    X-rays Hardware intact.  Good alignment.  No complicating features.   Plan Patient will continue partial weightbearing 50% left lower extremity until follow-up visit with Dr. Ophelia Charter in 4 weeks for recheck.  Repeat x-rays at that visit.  Return sooner if needed.

## 2020-06-25 NOTE — Anesthesia Postprocedure Evaluation (Signed)
Anesthesia Post Note  Patient: Alison Carpenter  Procedure(s) Performed: INTRAMEDULLARY (IM) NAIL FEMORAL (Left Hip)     Patient location during evaluation: PACU Anesthesia Type: Spinal Level of consciousness: oriented and awake and alert Pain management: pain level controlled Vital Signs Assessment: post-procedure vital signs reviewed and stable Respiratory status: spontaneous breathing, respiratory function stable and patient connected to nasal cannula oxygen Cardiovascular status: blood pressure returned to baseline and stable Postop Assessment: no headache, no backache and no apparent nausea or vomiting Anesthetic complications: no   No complications documented.  Last Vitals:  Vitals:   06/15/20 2239 06/16/20 0525  BP: 131/71 (!) 150/79  Pulse: 79 87  Resp: 16 16  Temp: 36.8 C 36.9 C  SpO2: 91% 95%    Last Pain:  Vitals:   06/16/20 0900  TempSrc:   PainSc: 0-No pain                 ODDONO,ERNEST

## 2020-07-02 ENCOUNTER — Ambulatory Visit (INDEPENDENT_AMBULATORY_CARE_PROVIDER_SITE_OTHER): Payer: Medicare Other | Admitting: Orthopaedic Surgery

## 2020-07-02 ENCOUNTER — Other Ambulatory Visit: Payer: Self-pay

## 2020-07-02 VITALS — BP 149/83 | HR 88 | Ht 63.0 in | Wt 82.0 lb

## 2020-07-02 DIAGNOSIS — S72145D Nondisplaced intertrochanteric fracture of left femur, subsequent encounter for closed fracture with routine healing: Secondary | ICD-10-CM

## 2020-07-02 NOTE — Progress Notes (Signed)
   Post-Op Visit Note   Patient: Alison Carpenter           Date of Birth: [birth year]           MRN: [id number] Visit Date: 07/02/2020 PCP: Burton Apley, MD   Assessment & Plan: 87 year old female post left trochanteric nail for intertrochanteric screw.  She is walking with a walker she is here with her daughter.  We encouraged continued increase fluid intake particularly proteins.  Inferior transverse locking screw site had some prominence attempted aspiration after Betadine prep I was unable to aspirate any fluid likely just clot.  Band-Aid applied Staples harvested Steri-Strips applied.  She is walking without pain she can return as needed.  If they have any questions they will call.  Chief Complaint:  Chief Complaint  Patient presents with  . Left Hip - Follow-up, Wound Check   Visit Diagnoses:  1. Closed nondisplaced intertrochanteric fracture of left femur with routine healing, subsequent encounter     Plan: Continue ambulation with walker to prevent falling.  They will call if they have any questions.  If she has increased pain she can return for x-rays otherwise follow-up as needed.  Follow-Up Instructions: No follow-ups on file.   Orders:  No orders of the defined types were placed in this encounter.  No orders of the defined types were placed in this encounter.   Imaging: No results found.  PMFS History: Patient Active Problem List   Diagnosis Date Noted  . Iron deficiency anemia 06/16/2020  . Underweight 06/16/2020  . Physical deconditioning 06/16/2020  . Closed nondisplaced intertrochanteric fracture of left femur (H[facility])    No past medical history on file.  No family history on file.  Past Surgical History:  Procedure Laterality Date  . ABDOMINAL HYSTERECTOMY    . APPENDECTOMY    . CATARACT EXTRACTION, BILATERAL    . CHOLECYSTECTOMY    . FEMUR IM NAIL Left 06/10/2020   Procedure: INTRAMEDULLARY (IM) NAIL FEMORAL;  Surgeon: Eldred Manges, MD;   Location: [facility];  Service: Orthopedics;  Laterality: Left;   Social History   Occupational History  . Not on file  Tobacco Use  . Smoking status: Never Smoker  . Smokeless tobacco: Never Used  Vaping Use  . Vaping Use: Never used  Substance and Sexual Activity  . Alcohol use: Never  . Drug use: Never  . Sexual activity: Not Currently

## 2020-07-05 ENCOUNTER — Other Ambulatory Visit: Payer: Self-pay | Admitting: Orthopaedic Surgery

## 2020-07-05 NOTE — Telephone Encounter (Signed)
Please advise 

## 2020-09-30 ENCOUNTER — Other Ambulatory Visit (HOSPITAL_COMMUNITY): Payer: Self-pay | Admitting: Internal Medicine

## 2020-09-30 DIAGNOSIS — I501 Left ventricular failure: Secondary | ICD-10-CM

## 2020-10-14 ENCOUNTER — Other Ambulatory Visit (HOSPITAL_COMMUNITY): Payer: Self-pay | Admitting: Orthopedic Surgery

## 2020-10-14 ENCOUNTER — Other Ambulatory Visit: Payer: Self-pay

## 2020-10-14 ENCOUNTER — Ambulatory Visit (HOSPITAL_COMMUNITY)
Admission: RE | Admit: 2020-10-14 | Discharge: 2020-10-14 | Disposition: A | Discharge status: Home / Self Care | Payer: Medicare Other | Source: Ambulatory Visit | Attending: Orthopedic Surgery | Admitting: Orthopedic Surgery

## 2020-10-14 DIAGNOSIS — M7989 Other specified soft tissue disorders: Secondary | ICD-10-CM

## 2020-10-14 DIAGNOSIS — M79661 Pain in right lower leg: Secondary | ICD-10-CM

## 2020-10-14 NOTE — Progress Notes (Signed)
Right lower extremity venous duplex has been completed. Preliminary results can be found in CV Proc through chart review.  Results were given to BJ at Dr. Wadie Lessen office.  10/14/20 2:48 PM Olen Cordial RVT

## 2020-12-06 ENCOUNTER — Other Ambulatory Visit: Payer: Self-pay

## 2020-12-06 ENCOUNTER — Encounter (HOSPITAL_COMMUNITY): Payer: Self-pay

## 2020-12-06 ENCOUNTER — Inpatient Hospital Stay (HOSPITAL_COMMUNITY)
Admission: EM | Admit: 2020-12-06 | Discharge: 2020-12-10 | DRG: 444 | Disposition: A | Discharge status: Home Health | Payer: Medicare Other | Attending: Family Medicine | Admitting: Family Medicine

## 2020-12-06 ENCOUNTER — Emergency Department (HOSPITAL_COMMUNITY): Payer: Medicare Other

## 2020-12-06 DIAGNOSIS — R079 Chest pain, unspecified: Secondary | ICD-10-CM

## 2020-12-06 DIAGNOSIS — N170 Acute kidney failure with tubular necrosis: Secondary | ICD-10-CM | POA: Diagnosis not present

## 2020-12-06 DIAGNOSIS — Z9049 Acquired absence of other specified parts of digestive tract: Secondary | ICD-10-CM | POA: Diagnosis not present

## 2020-12-06 DIAGNOSIS — Z20822 Contact with and (suspected) exposure to covid-19: Secondary | ICD-10-CM | POA: Diagnosis present

## 2020-12-06 DIAGNOSIS — K449 Diaphragmatic hernia without obstruction or gangrene: Secondary | ICD-10-CM | POA: Diagnosis not present

## 2020-12-06 DIAGNOSIS — I517 Cardiomegaly: Secondary | ICD-10-CM | POA: Diagnosis not present

## 2020-12-06 DIAGNOSIS — R54 Age-related physical debility: Secondary | ICD-10-CM | POA: Diagnosis not present

## 2020-12-06 DIAGNOSIS — L89151 Pressure ulcer of sacral region, stage 1: Secondary | ICD-10-CM | POA: Diagnosis present

## 2020-12-06 DIAGNOSIS — Z681 Body mass index (BMI) 19 or less, adult: Secondary | ICD-10-CM | POA: Diagnosis not present

## 2020-12-06 DIAGNOSIS — K8031 Calculus of bile duct with cholangitis, unspecified, with obstruction: Secondary | ICD-10-CM | POA: Diagnosis not present

## 2020-12-06 DIAGNOSIS — E876 Hypokalemia: Secondary | ICD-10-CM | POA: Diagnosis not present

## 2020-12-06 DIAGNOSIS — D509 Iron deficiency anemia, unspecified: Secondary | ICD-10-CM | POA: Diagnosis present

## 2020-12-06 DIAGNOSIS — K831 Obstruction of bile duct: Secondary | ICD-10-CM | POA: Diagnosis present

## 2020-12-06 DIAGNOSIS — L8990 Pressure ulcer of unspecified site, unspecified stage: Secondary | ICD-10-CM | POA: Insufficient documentation

## 2020-12-06 DIAGNOSIS — E43 Unspecified severe protein-calorie malnutrition: Secondary | ICD-10-CM | POA: Diagnosis not present

## 2020-12-06 DIAGNOSIS — K8020 Calculus of gallbladder without cholecystitis without obstruction: Secondary | ICD-10-CM

## 2020-12-06 LAB — TROPONIN I (HIGH SENSITIVITY)
Troponin I (High Sensitivity): 7 ng/L (ref <18)
Troponin I (High Sensitivity): 9 ng/L (ref <18)

## 2020-12-06 LAB — URINALYSIS, ROUTINE W REFLEX MICROSCOPIC
Bacteria, UA: NONE SEEN
Bilirubin Urine: NEGATIVE
Glucose, UA: NEGATIVE mg/dL
Ketones, ur: NEGATIVE mg/dL
Leukocytes,Ua: NEGATIVE
Nitrite: NEGATIVE
Protein, ur: NEGATIVE mg/dL
Specific Gravity, Urine: 1.009 (ref 1.005–1.030)
pH: 8 (ref 5.0–8.0)

## 2020-12-06 LAB — COMPREHENSIVE METABOLIC PANEL
ALT: 462 U/L — ABNORMAL HIGH (ref 0–44)
AST: 786 U/L — ABNORMAL HIGH (ref 15–41)
Albumin: 4.2 g/dL (ref 3.5–5.0)
Alkaline Phosphatase: 187 U/L — ABNORMAL HIGH (ref 38–126)
Anion gap: 9 (ref 5–15)
BUN: 12 mg/dL (ref 8–23)
CO2: 28 mmol/L (ref 22–32)
Calcium: 8.9 mg/dL (ref 8.9–10.3)
Chloride: 99 mmol/L (ref 98–111)
Creatinine, Ser: 0.53 mg/dL (ref 0.44–1.00)
GFR, Estimated: >60 mL/min (ref >60)
Glucose, Bld: 123 mg/dL — ABNORMAL HIGH (ref 70–99)
Potassium: 4.3 mmol/L (ref 3.5–5.1)
Sodium: 136 mmol/L (ref 135–145)
Total Bilirubin: 3.2 mg/dL — ABNORMAL HIGH (ref 0.3–1.2)
Total Protein: 7.5 g/dL (ref 6.5–8.1)

## 2020-12-06 LAB — CBC WITH DIFFERENTIAL/PLATELET
Abs Immature Granulocytes: 0.06 10*3/uL (ref 0.00–0.07)
Basophils Absolute: 0.1 10*3/uL (ref 0.0–0.1)
Basophils Relative: 0 %
Eosinophils Absolute: 0 10*3/uL (ref 0.0–0.5)
Eosinophils Relative: 0 %
HCT: 45.8 % (ref 36.0–46.0)
Hemoglobin: 14.5 g/dL (ref 12.0–15.0)
Immature Granulocytes: 0 %
Lymphocytes Relative: 1 %
Lymphs Abs: 0.2 10*3/uL — ABNORMAL LOW (ref 0.7–4.0)
MCH: 31.5 pg (ref 26.0–34.0)
MCHC: 31.7 g/dL (ref 30.0–36.0)
MCV: 99.6 fL (ref 80.0–100.0)
Monocytes Absolute: 0.4 10*3/uL (ref 0.1–1.0)
Monocytes Relative: 3 %
Neutro Abs: 13.6 10*3/uL — ABNORMAL HIGH (ref 1.7–7.7)
Neutrophils Relative %: 96 %
Platelets: 183 10*3/uL (ref 150–400)
RBC: 4.6 MIL/uL (ref 3.87–5.11)
RDW: 13.4 % (ref 11.5–15.5)
WBC: 14.3 10*3/uL — ABNORMAL HIGH (ref 4.0–10.5)
nRBC: 0 % (ref 0.0–0.2)

## 2020-12-06 LAB — RESP PANEL BY RT-PCR (FLU A&B, COVID) ARPGX2
Influenza A by PCR: NEGATIVE
Influenza B by PCR: NEGATIVE
SARS Coronavirus 2 by RT PCR: NEGATIVE

## 2020-12-06 LAB — LIPASE, BLOOD: Lipase: 101 U/L — ABNORMAL HIGH (ref 11–51)

## 2020-12-06 MED ORDER — IOHEXOL 350 MG/ML SOLN
80.0000 mL | Freq: Once | INTRAVENOUS | Status: AC | PRN
  Administered 2020-12-06: 80 mL via INTRAVENOUS

## 2020-12-06 MED ORDER — MORPHINE SULFATE (PF) 2 MG/ML IV SOLN
2.0000 mg | INTRAVENOUS | Status: AC | PRN
  Administered 2020-12-06 (×2): 2 mg via INTRAVENOUS
  Filled 2020-12-06 (×2): qty 1

## 2020-12-06 MED ORDER — SODIUM CHLORIDE 0.9 % IV BOLUS (SEPSIS)
500.0000 mL | Freq: Once | INTRAVENOUS | Status: AC
  Administered 2020-12-06: 500 mL via INTRAVENOUS

## 2020-12-06 MED ORDER — PIPERACILLIN-TAZOBACTAM 3.375 G IVPB 30 MIN
3.3750 g | INTRAVENOUS | Status: AC
  Administered 2020-12-06: 3.375 g via INTRAVENOUS
  Filled 2020-12-06: qty 50

## 2020-12-06 MED ORDER — KETOROLAC TROMETHAMINE 15 MG/ML IJ SOLN
15.0000 mg | Freq: Four times a day (QID) | INTRAMUSCULAR | Status: DC | PRN
  Administered 2020-12-06: 15 mg via INTRAVENOUS
  Filled 2020-12-06: qty 1

## 2020-12-06 MED ORDER — MORPHINE SULFATE (PF) 4 MG/ML IV SOLN: 4.0000 mg | Freq: Once | INTRAVENOUS | Status: DC

## 2020-12-06 MED ORDER — ONDANSETRON HCL 4 MG/2ML IJ SOLN
4.0000 mg | Freq: Once | INTRAMUSCULAR | Status: AC
  Administered 2020-12-06: 4 mg via INTRAVENOUS
  Filled 2020-12-06: qty 2

## 2020-12-06 MED ORDER — SODIUM CHLORIDE 0.9 % IV SOLN
1000.0000 mL | INTRAVENOUS | Status: DC
  Administered 2020-12-06: 1000 mL via INTRAVENOUS

## 2020-12-06 NOTE — ED Notes (Signed)
Lab called for an update on lab results. Per lab staff, machines are down so there are some delays in testing.

## 2020-12-06 NOTE — ED Provider Notes (Signed)
Emergency Medicine Provider Triage Evaluation Note  Alison Carpenter , a 87 y.o. female  was evaluated in triage.  Pt complains of abdominal pain that started earlier today.  Denies associated nausea, vomiting, diarrhea.  She also endorses some chest pain under her left breast.  Per EMS, patient has had numerous abdominal operations.  Denies dysuria.  Review of Systems  Positive: Abdominal pain, CP Negative: N/V/D  Physical Exam  There were no vitals taken for this visit. Gen:   Awake, no distress   Resp:  Normal effort  MSK:   Moves extremities without difficulty  Other:  Diffuse abdominal tenderness with voluntary guarding.   Medical Decision Making  Medically screening exam initiated at 3:51 PM.  Appropriate orders placed.  ARLENNE KIMBLEY was informed that the remainder of the evaluation will be completed by another provider, this initial triage assessment does not replace that evaluation, and the importance of remaining in the ED until their evaluation is complete.  Abdominal labs Cardiac labs   Jesusita Oka 12/06/20 1558    Derwood Kaplan, MD 12/07/20 1106

## 2020-12-06 NOTE — ED Provider Notes (Addendum)
[facility]-EMERGENCY DEPT Provider Note   CSN: [id number] Arrival date & time: 12/06/20  1538     History Chief Complaint  Patient presents with   Abdominal Pain    Alison Carpenter is a 87 y.o. female.   Abdominal Pain  Patient presented to the for evaluation of acute onset of nausea vomiting and some lower abdominal discomfort.  Patient states she initially felt fine when she woke up this morning.  She suddenly started having nausea and felt ill.  She has not vomited but she feels like she needs to vomit.  She denies any diarrhea.  No dysuria.  No constipation.  Patient denies any chest pain or shortness of breath.  History reviewed. No pertinent past medical history.  Patient Active Problem List   Diagnosis Date Noted   Iron deficiency anemia 06/16/2020   Underweight 06/16/2020   Physical deconditioning 06/16/2020   Closed nondisplaced intertrochanteric fracture of left femur [facility])     Past Surgical History:  Procedure Laterality Date   ABDOMINAL HYSTERECTOMY     APPENDECTOMY     CATARACT EXTRACTION, BILATERAL     CHOLECYSTECTOMY     FEMUR IM NAIL Left 06/10/2020   Procedure: INTRAMEDULLARY (IM) NAIL FEMORAL;  Surgeon: Eldred Manges, MD;  Location: [facility];  Service: Orthopedics;  Laterality: Left;     OB History   No obstetric history on file.     History reviewed. No pertinent family history.  Social History   Tobacco Use   Smoking status: Never   Smokeless tobacco: Never  Vaping Use   Vaping Use: Never used  Substance Use Topics   Alcohol use: Never   Drug use: Never    Home Medications Prior to Admission medications   Medication Sig Start Date End Date Taking? Authorizing Provider  ferrous sulfate 325 (65 FE) MG tablet Take 1 tablet (325 mg total) by mouth daily. 06/16/20 09/14/20  Noralee Stain, DO  traMADol (ULTRAM) 50 MG tablet TAKE 1 TABLET BY MOUTH EVERY 12 HOURS AS NEEDED. 07/05/20   Eldred Manges, MD    Allergies     Patient has no known allergies.  Review of Systems   Review of Systems  Gastrointestinal:  Positive for abdominal pain.  All other systems reviewed and are negative.  Physical Exam Updated Vital Signs BP 132/67   Pulse (!) 102   Temp 98.9 F (37.2 C) (Oral)   Resp 17   SpO2 94%   Physical Exam Vitals and nursing note reviewed.  Constitutional:      General: She is not in acute distress.    Appearance: She is well-developed.  HENT:     Head: Normocephalic and atraumatic.     Right Ear: External ear normal.     Left Ear: External ear normal.  Eyes:     General: No scleral icterus.       Right eye: No discharge.        Left eye: No discharge.     Conjunctiva/sclera: Conjunctivae normal.  Neck:     Trachea: No tracheal deviation.  Cardiovascular:     Rate and Rhythm: Normal rate and regular rhythm.  Pulmonary:     Effort: Pulmonary effort is normal. No respiratory distress.     Breath sounds: Normal breath sounds. No stridor. No wheezing or rales.  Abdominal:     General: Abdomen is flat. Bowel sounds are normal. There is no distension.     Palpations: Abdomen is soft.  Tenderness: There is abdominal tenderness in the suprapubic area and left lower quadrant. There is no guarding or rebound.     Hernia: No hernia is present.  Musculoskeletal:        General: No tenderness or deformity.     Cervical back: Neck supple.  Skin:    General: Skin is warm and dry.     Findings: No rash.  Neurological:     General: No focal deficit present.     Mental Status: She is alert.     Cranial Nerves: No cranial nerve deficit (no facial droop, extraocular movements intact, no slurred speech).     Sensory: No sensory deficit.     Motor: No abnormal muscle tone or seizure activity.     Coordination: Coordination normal.  Psychiatric:        Mood and Affect: Mood normal.    ED Results / Procedures / Treatments   Labs (all labs ordered are listed, but only abnormal results are  displayed) Labs Reviewed  CBC WITH DIFFERENTIAL/PLATELET - Abnormal; Notable for the following components:      Result Value   WBC 14.3 (*)    Neutro Abs 13.6 (*)    Lymphs Abs 0.2 (*)    All other components within normal limits  COMPREHENSIVE METABOLIC PANEL - Abnormal; Notable for the following components:   Glucose, Bld 123 (*)    AST 786 (*)    ALT 462 (*)    Alkaline Phosphatase 187 (*)    Total Bilirubin 3.2 (*)    All other components within normal limits  LIPASE, BLOOD - Abnormal; Notable for the following components:   Lipase 101 (*)    All other components within normal limits  URINALYSIS, ROUTINE W REFLEX MICROSCOPIC - Abnormal; Notable for the following components:   Hgb urine dipstick SMALL (*)    All other components within normal limits  RESP PANEL BY RT-PCR (FLU A&B, COVID) ARPGX2  TROPONIN I (HIGH SENSITIVITY)  TROPONIN I (HIGH SENSITIVITY)    EKG EKG Interpretation  Date/Time:  Monday December 06 2020 16:30:33 EDT Ventricular Rate:  108 PR Interval:  168 QRS Duration: 82 QT Interval:  372 QTC Calculation: 499 R Axis:   -8 Text Interpretation: Sinus tachycardia Borderline T abnormalities, anterior leads Borderline prolonged QT interval Since last tracing rate faster Confirmed by Linwood Dibbles [phone]) on 12/06/2020 4:33:21 PM  Radiology DG Chest 1 View  Result Date: 12/06/2020 CLINICAL DATA:  Chest and abdominal pain. EXAM: CHEST  1 VIEW COMPARISON:  Remote radiograph 08/28/2006 FINDINGS: The heart is enlarged. Hilar prominence likely related to vascular structures, and similar to remote exam. There is aortic atherosclerosis. No pulmonary edema, focal airspace disease, or pneumothorax. There may be trace pleural effusions. No acute osseous abnormalities are seen. IMPRESSION: Cardiomegaly with possible trace pleural effusions. Electronically Signed   By: Narda Rutherford M.D.   On: 12/06/2020 18:45   CT ABDOMEN PELVIS W CONTRAST  Result Date: 12/06/2020 CLINICAL  DATA:  Abdomen pain fever epigastric pain EXAM: CT ABDOMEN AND PELVIS WITH CONTRAST TECHNIQUE: Multidetector CT imaging of the abdomen and pelvis was performed using the standard protocol following bolus administration of intravenous contrast. CONTRAST:  33mL OMNIPAQUE IOHEXOL 350 MG/ML SOLN COMPARISON:  ERCP report 02/28/2002 FINDINGS: Lower chest: Lung bases demonstrate no acute consolidation or effusion. Mild scarring at the bases. Mild cardiomegaly. Hepatobiliary: No focal hepatic abnormality. Gallbladder not clearly identified and is presumed surgically absent. Pneumobilia. Severe intra and extrahepatic biliary dilatation. Possible stone,  debris, or lesion in the distal common bile duct, series 2, image 38. Pancreas: No inflammatory changes. Diffuse dilatation of the pancreatic duct. Pancreas is atrophic. Spleen: Normal in size without focal abnormality. Adrenals/Urinary Tract: Adrenal glands are normal. Kidneys show no hydronephrosis. Small stone in the right kidney. The right kidney is low lying, near the upper pelvis. The bladder is distended Stomach/Bowel: The stomach is nonenlarged. No dilated small bowel. No acute bowel wall thickening. Vascular/Lymphatic: Advanced aortic atherosclerosis. No aneurysm. No suspicious nodes. Reproductive: Status post hysterectomy.  No adnexal mass Other: No pelvic effusion or free air. Postsurgical changes of the anterior abdominal wall. Musculoskeletal: No acute or suspicious osseous abnormality. 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 IMPRESSION: 1. Status post cholecystectomy. Severe intra and extrahepatic biliary dilatation with dilatation of the pancreatic duct concerning for obstructive process, no obvious pancreas mass lesion seen. Possible increased density within the distal common bile duct which could be due to stones, debris, or other ductal lesion, consider correlation with MRCP. Pneumobilia is present, presumably due to prior sphincterotomy. Correlate with surgical  history. 2. Nonobstructing right kidney stone. Low lying right kidney in the upper pelvis. Electronically Signed   By: Jasmine Pang M.D.   On: 12/06/2020 20:04    Procedures Procedures   Medications Ordered in ED Medications  sodium chloride 0.9 % bolus 500 mL (0 mLs Intravenous Stopped 12/06/20 1728)    Followed by  0.9 %  sodium chloride infusion (1,000 mLs Intravenous New Bag/Given 12/06/20 1700)  morphine 2 MG/ML injection 2 mg (2 mg Intravenous Given 12/06/20 1947)  ondansetron (ZOFRAN) injection 4 mg (4 mg Intravenous Given 12/06/20 1700)  iohexol (OMNIPAQUE) 350 MG/ML injection 80 mL (80 mLs Intravenous Contrast Given 12/06/20 1933)    ED Course  I have reviewed the triage vital signs and the nursing notes.  Pertinent labs & imaging results that were available during my care of the patient were reviewed by me and considered in my medical decision making (see chart for details).  Clinical Course as of 12/06/20 2135  Mon Dec 06, 2020  2034 Urinalysis without signs of infection.  White blood cell count elevated at 14.  Metabolic panel notable for significant elevation in LFTs including total bilirubin.  Lipase is also elevated at 101 [JK]  2035 CT scan shows severe dilatation of extrahepatic and intrahepatic biliary ducts [JK]    Clinical Course User Index [JK] Linwood Dibbles, MD   MDM Rules/Calculators/A&P                           Patient presented to the ED for evaluation of abdominal pain.  Patient has prior cholecystectomy.  Symptoms concerning for the possibility of colitis, diverticulitis, obstruction, pancreatitis.  Laboratory test did show slightly elevated lipase.  LFTs were also elevated.  CT scan was performed and it shows evidence of biliary ductal obstruction.  Discussed the findings with the patient and the daughter.  Patient does have prior history of choledocholithiasis following a cholecystectomy. Possible she could be having a recurrent episode.  Some type of  obstructing mass that is obviously concerned at her age as well.  Patient has been given IV pain medications and antiemetics.  I will consult the medical service for admission.  Chat message sent to GI service Dr Adela Lank Final Clinical Impression(s) / ED Diagnoses Final diagnoses:  Biliary obstruction      Linwood Dibbles, MD 12/06/20 2136

## 2020-12-06 NOTE — ED Triage Notes (Signed)
Pt BIB EMS from home. Pt reports abdominal pain that began today. Denies chest pain and N/V/D.

## 2020-12-06 NOTE — Progress Notes (Signed)
A consult was received from an ED physician for Zosyn per pharmacy dosing.  The patient's profile has been reviewed for ht/wt/allergies/indication/available labs.    A one time order has been placed for Zosyn 3.375gm IV.    Further antibiotics/pharmacy consults should be ordered by admitting physician if indicated.                       Thank you, Maryellen Pile, PharmD 12/06/2020  11:03 PM

## 2020-12-06 NOTE — H&P (Signed)
History and Physical   KENDELLE PIRKL KPQ:[id number] DOB: 1918/02/21 DOA: 12/06/2020  Referring MD/NP/PA: Dr. Linwood Dibbles  PCP: Burton Apley, MD   Outpatient Specialists: None  Patient coming from: Home  Chief Complaint: Abdominal pain  HPI: Alison Carpenter is a 87 y.o. female with medical history significant of cholecystitis s/p cholecystectomy, iron deficiency anemia, history of nondisplaced intertrochanteric fracture, generalized deconditioning who was brought in from home secondary to sudden onset of abdominal pain nausea vomiting today.  She woke up this morning fine when she started having the nausea and then felt sick.  She tried to vomit initially but was unsuccessful.  No diarrhea no hematemesis no melena no bright red blood per rectum.  Patient came to the ER where she was seen and evaluated.  She had markedly elevated LFTs.  Imaging showed dilated common bile duct.  No prior history of malignancy.  No obvious stones but suspected CBD and biliary tract obstruction.  Malignancy cannot be ruled out at this point.  She is being admitted for further evaluation including MRCP.Marland Kitchen  ED Course: Temperature is 98.9 blood pressure 164/80 pulse 107 respirate 33 oxygen sat 90% on room air.  Influenza and COVID-19 screen negative.  Troponin of 9.chemistry showed glucose of 123.  Alkaline phosphatase 187 albumin 4.2.  Lipase is 101 AST 786 ALT 462 total protein 7.5 total bilirubin 3.2.  White count 14.3.  Chest x-ray showed cardiomegaly with possible pleural effusion.  CT abdomen pelvis shows status postcholecystectomy with severe intra and extrahepatic biliary dilatation.  There is also dilatation of the pancreatic tail concerning for obstructive process.  No obvious pancreatic mass lesion.  There is possible increased density within the distal common bile duct could be stone debris or other ductal lesions.  Also nonobstructing kidney stones.  Patient being admitted for further evaluation and  treatment.  Review of Systems: As per HPI otherwise 10 point review of systems negative.    History reviewed. No pertinent past medical history.  Past Surgical History:  Procedure Laterality Date   ABDOMINAL HYSTERECTOMY     APPENDECTOMY     CATARACT EXTRACTION, BILATERAL     CHOLECYSTECTOMY     FEMUR IM NAIL Left 06/10/2020   Procedure: INTRAMEDULLARY (IM) NAIL FEMORAL;  Surgeon: Eldred Manges, MD;  Location: [facility];  Service: Orthopedics;  Laterality: Left;     reports that she has never smoked. She has never used smokeless tobacco. She reports that she does not drink alcohol and does not use drugs.  No Known Allergies  History reviewed. No pertinent family history.   Prior to Admission medications   Medication Sig Start Date End Date Taking? Authorizing Provider  ferrous sulfate 325 (65 FE) MG tablet Take 1 tablet (325 mg total) by mouth daily. 06/16/20 09/14/20  Noralee Stain, DO  traMADol (ULTRAM) 50 MG tablet TAKE 1 TABLET BY MOUTH EVERY 12 HOURS AS NEEDED. 07/05/20   Eldred Manges, MD    Physical Exam: Vitals:   12/06/20 1830 12/06/20 1915 12/06/20 2000 12/06/20 2100  BP: (!) 146/66 132/66 136/67 132/67  Pulse: 100 (!) 102 (!) 104 (!) 102  Resp: (!) 23 (!) 23 18 17   Temp:      TempSrc:      SpO2: 92% 94% 94% 94%      Constitutional: Frail, chronically ill looking, in mild distress Vitals:   12/06/20 1830 12/06/20 1915 12/06/20 2000 12/06/20 2100  BP: (!) 146/66 132/66 136/67 132/67  Pulse: 100 (!) 102 (!)  104 (!) 102  Resp: (!) 23 (!) 23 18 17   Temp:      TempSrc:      SpO2: 92% 94% 94% 94%   Eyes: PERRL, lids and conjunctivae normal ENMT: Mucous membranes are dry. Posterior pharynx clear of any exudate or lesions.Normal dentition.  Neck: normal, supple, no masses, no thyromegaly Respiratory: clear to auscultation bilaterally, no wheezing, no crackles. Normal respiratory effort. No accessory muscle use.  Cardiovascular: Sinus tachycardia, no murmurs / rubs  / gallops. No extremity edema. 2+ pedal pulses. No carotid bruits.  Abdomen: Epigastric and right upper quadrant abdominal tenderness, scaphoid abdomen, no masses palpated. No hepatosplenomegaly. Bowel sounds positive.  Musculoskeletal: no clubbing / cyanosis. No joint deformity upper and lower extremities. Good ROM, no contractures. Normal muscle tone.  Skin: no rashes, lesions, ulcers. No induration Neurologic: CN 2-12 grossly intact. Sensation intact, DTR normal. Strength 5/5 in all 4.  Psychiatric: Normal judgment and insight. Alert and oriented x 3. Normal mood.     Labs on Admission: I have personally reviewed following labs and imaging studies  CBC: Recent Labs  Lab 12/06/20 1551  WBC 14.3*  NEUTROABS 13.6*  HGB 14.5  HCT 45.8  MCV 99.6  PLT 183   Basic Metabolic Panel: Recent Labs  Lab 12/06/20 1551  NA 136  K 4.3  CL 99  CO2 28  GLUCOSE 123*  BUN 12  CREATININE 0.53  CALCIUM 8.9   GFR: CrCl cannot be calculated (Unknown ideal weight.). Liver Function Tests: Recent Labs  Lab 12/06/20 1551  AST 786*  ALT 462*  ALKPHOS 187*  BILITOT 3.2*  PROT 7.5  ALBUMIN 4.2   Recent Labs  Lab 12/06/20 1551  LIPASE 101*   No results for input(s): AMMONIA in the last 168 hours. Coagulation Profile: No results for input(s): INR, PROTIME in the last 168 hours. Cardiac Enzymes: No results for input(s): CKTOTAL, CKMB, CKMBINDEX, TROPONINI in the last 168 hours. BNP (last 3 results) No results for input(s): PROBNP in the last 8760 hours. HbA1C: No results for input(s): HGBA1C in the last 72 hours. CBG: No results for input(s): GLUCAP in the last 168 hours. Lipid Profile: No results for input(s): CHOL, HDL, LDLCALC, TRIG, CHOLHDL, LDLDIRECT in the last 72 hours. Thyroid Function Tests: No results for input(s): TSH, T4TOTAL, FREET4, T3FREE, THYROIDAB in the last 72 hours. Anemia Panel: No results for input(s): VITAMINB12, FOLATE, FERRITIN, TIBC, IRON, RETICCTPCT in  the last 72 hours. Urine analysis:    Component Value Date/Time   COLORURINE YELLOW 12/06/2020 1551   APPEARANCEUR CLEAR 12/06/2020 1551   LABSPEC 1.009 12/06/2020 1551   PHURINE 8.0 12/06/2020 1551   GLUCOSEU NEGATIVE 12/06/2020 1551   HGBUR SMALL (A) 12/06/2020 1551   BILIRUBINUR NEGATIVE 12/06/2020 1551   KETONESUR NEGATIVE 12/06/2020 1551   PROTEINUR NEGATIVE 12/06/2020 1551   NITRITE NEGATIVE 12/06/2020 1551   LEUKOCYTESUR NEGATIVE 12/06/2020 1551   Sepsis Labs: @LABRCNTIP (procalcitonin:4,lacticidven:4) ) Recent Results (from the past 240 hour(s))  Resp Panel by RT-PCR (Flu A&B, Covid) Nasopharyngeal Swab     Status: None   Collection Time: 12/06/20  4:56 PM   Specimen: Nasopharyngeal Swab; Nasopharyngeal(NP) swabs in vial transport medium  Result Value Ref Range Status   SARS Coronavirus 2 by RT PCR NEGATIVE NEGATIVE Final    Comment: (NOTE) SARS-CoV-2 target nucleic acids are NOT DETECTED.  The SARS-CoV-2 RNA is generally detectable in upper respiratory specimens during the acute phase of infection. The lowest concentration of SARS-CoV-2 viral copies this assay  can detect is 138 copies/mL. A negative result does not preclude SARS-Cov-2 infection and should not be used as the sole basis for treatment or other patient management decisions. A negative result may occur with  improper specimen collection/handling, submission of specimen other than nasopharyngeal swab, presence of viral mutation(s) within the areas targeted by this assay, and inadequate number of viral copies(<138 copies/mL). A negative result must be combined with clinical observations, patient history, and epidemiological information. The expected result is Negative.  Fact Sheet for Patients:  BloggerCourse.com  Fact Sheet for Healthcare Providers:  SeriousBroker.it  This test is no t yet approved or cleared by the [place] FDA and  has been  authorized for detection and/or diagnosis of SARS-CoV-2 by FDA under an Emergency Use Authorization (EUA). This EUA will remain  in effect (meaning this test can be used) for the duration of the COVID-19 declaration under Section 564(b)(1) of the Act, 21 U.S.C.section 360bbb-3(b)(1), unless the authorization is terminated  or revoked sooner.       Influenza A by PCR NEGATIVE NEGATIVE Final   Influenza B by PCR NEGATIVE NEGATIVE Final    Comment: (NOTE) The Xpert Xpress SARS-CoV-2/FLU/RSV plus assay is intended as an aid in the diagnosis of influenza from Nasopharyngeal swab specimens and should not be used as a sole basis for treatment. Nasal washings and aspirates are unacceptable for Xpert Xpress SARS-CoV-2/FLU/RSV testing.  Fact Sheet for Patients: BloggerCourse.com  Fact Sheet for Healthcare Providers: SeriousBroker.it  This test is not yet approved or cleared by the [place] FDA and has been authorized for detection and/or diagnosis of SARS-CoV-2 by FDA under an Emergency Use Authorization (EUA). This EUA will remain in effect (meaning this test can be used) for the duration of the COVID-19 declaration under Section 564(b)(1) of the Act, 21 U.S.C. section 360bbb-3(b)(1), unless the authorization is terminated or revoked.  Performed at [facility], [address] [place], [place] [address]      Radiological Exams on Admission: DG Chest 1 View  Result Date: 12/06/2020 CLINICAL DATA:  Chest and abdominal pain. EXAM: CHEST  1 VIEW COMPARISON:  Remote radiograph 08/28/2006 FINDINGS: The heart is enlarged. Hilar prominence likely related to vascular structures, and similar to remote exam. There is aortic atherosclerosis. No pulmonary edema, focal airspace disease, or pneumothorax. There may be trace pleural effusions. No acute osseous abnormalities are seen. IMPRESSION: Cardiomegaly with possible trace  pleural effusions. Electronically Signed   By: Narda Rutherford M.D.   On: 12/06/2020 18:45   CT ABDOMEN PELVIS W CONTRAST  Result Date: 12/06/2020 CLINICAL DATA:  Abdomen pain fever epigastric pain EXAM: CT ABDOMEN AND PELVIS WITH CONTRAST TECHNIQUE: Multidetector CT imaging of the abdomen and pelvis was performed using the standard protocol following bolus administration of intravenous contrast. CONTRAST:  35mL OMNIPAQUE IOHEXOL 350 MG/ML SOLN COMPARISON:  ERCP report 02/28/2002 FINDINGS: Lower chest: Lung bases demonstrate no acute consolidation or effusion. Mild scarring at the bases. Mild cardiomegaly. Hepatobiliary: No focal hepatic abnormality. Gallbladder not clearly identified and is presumed surgically absent. Pneumobilia. Severe intra and extrahepatic biliary dilatation. Possible stone, debris, or lesion in the distal common bile duct, series 2, image 38. Pancreas: No inflammatory changes. Diffuse dilatation of the pancreatic duct. Pancreas is atrophic. Spleen: Normal in size without focal abnormality. Adrenals/Urinary Tract: Adrenal glands are normal. Kidneys show no hydronephrosis. Small stone in the right kidney. The right kidney is low lying, near the upper pelvis. The bladder is distended Stomach/Bowel: The stomach is  nonenlarged. No dilated small bowel. No acute bowel wall thickening. Vascular/Lymphatic: Advanced aortic atherosclerosis. No aneurysm. No suspicious nodes. Reproductive: Status post hysterectomy.  No adnexal mass Other: No pelvic effusion or free air. Postsurgical changes of the anterior abdominal wall. Musculoskeletal: No acute or suspicious osseous abnormality. 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 IMPRESSION: 1. Status post cholecystectomy. Severe intra and extrahepatic biliary dilatation with dilatation of the pancreatic duct concerning for obstructive process, no obvious pancreas mass lesion seen. Possible increased density within the distal common bile duct which could be due to  stones, debris, or other ductal lesion, consider correlation with MRCP. Pneumobilia is present, presumably due to prior sphincterotomy. Correlate with surgical history. 2. Nonobstructing right kidney stone. Low lying right kidney in the upper pelvis. Electronically Signed   By: Jasmine Pang M.D.   On: 12/06/2020 20:04      Assessment/Plan Principal Problem:   Biliary obstruction Active Problems:   Iron deficiency anemia   #1 biliary obstruction: No obvious cause identified.  Most likely stones debris or even malignancy.  Patient will be admitted.  MRCP to be done.  GI consulted we will continue current treatment.  She will be kept NPO.  IV fluids and supportive care.  #2 iron deficiency anemia: Monitor H&H  #3 history of cholecystectomy: Stable.  Pain control as well is n.p.o. for now     DVT prophylaxis: SCD Code Status: Full code Family Communication: No family at bedside Disposition Plan: To be determined Consults called: Dr. Adela Lank, GI Admission status: Inpatient  Severity of Illness: The appropriate patient status for this patient is INPATIENT. Inpatient status is judged to be reasonable and necessary in order to provide the required intensity of service to ensure the patient's safety. The patient's presenting symptoms, physical exam findings, and initial radiographic and laboratory data in the context of their chronic comorbidities is felt to place them at high risk for further clinical deterioration. Furthermore, it is not anticipated that the patient will be medically stable for discharge from the hospital within 2 midnights of admission. The following factors support the patient status of inpatient.   " The patient's presenting symptoms include abdominal pain with nausea. " The worrisome physical exam findings include frail woman with right upper quadrant tenderness. " The initial radiographic and laboratory data are worrisome because of obstructive biliary system. " The  chronic co-morbidities include iron deficiency anemia.   * I certify that at the point of admission it is my clinical judgment that the patient will require inpatient hospital care spanning beyond 2 midnights from the point of admission due to high intensity of service, high risk for further deterioration and high frequency of surveillance required.Lonia Blood MD [facility] Pager [phone]  If 7PM-7AM, please contact night-coverage [email or web address] Password [facility]  12/06/2020, 9:52 PM

## 2020-12-07 ENCOUNTER — Inpatient Hospital Stay (HOSPITAL_COMMUNITY): Payer: Medicare Other | Admitting: Certified Registered"

## 2020-12-07 ENCOUNTER — Inpatient Hospital Stay (HOSPITAL_COMMUNITY): Payer: Medicare Other

## 2020-12-07 ENCOUNTER — Encounter (HOSPITAL_COMMUNITY)
Admission: EM | Disposition: A | Discharge status: Home Health | Payer: Self-pay | Source: Home / Self Care | Attending: Family Medicine

## 2020-12-07 DIAGNOSIS — K831 Obstruction of bile duct: Secondary | ICD-10-CM

## 2020-12-07 DIAGNOSIS — L8990 Pressure ulcer of unspecified site, unspecified stage: Secondary | ICD-10-CM | POA: Insufficient documentation

## 2020-12-07 HISTORY — PX: ERCP: SHX5425

## 2020-12-07 HISTORY — PX: BILIARY STENT PLACEMENT: SHX5538

## 2020-12-07 LAB — CBC
HCT: 37.3 % (ref 36.0–46.0)
Hemoglobin: 12.6 g/dL (ref 12.0–15.0)
MCH: 32.3 pg (ref 26.0–34.0)
MCHC: 33.8 g/dL (ref 30.0–36.0)
MCV: 95.6 fL (ref 80.0–100.0)
Platelets: 146 10*3/uL — ABNORMAL LOW (ref 150–400)
RBC: 3.9 MIL/uL (ref 3.87–5.11)
RDW: 13.7 % (ref 11.5–15.5)
WBC: 30.9 10*3/uL — ABNORMAL HIGH (ref 4.0–10.5)
nRBC: 0 % (ref 0.0–0.2)

## 2020-12-07 LAB — COMPREHENSIVE METABOLIC PANEL
ALT: 356 U/L — ABNORMAL HIGH (ref 0–44)
AST: 392 U/L — ABNORMAL HIGH (ref 15–41)
Albumin: 3.1 g/dL — ABNORMAL LOW (ref 3.5–5.0)
Alkaline Phosphatase: 137 U/L — ABNORMAL HIGH (ref 38–126)
Anion gap: 13 (ref 5–15)
BUN: 12 mg/dL (ref 8–23)
CO2: 22 mmol/L (ref 22–32)
Calcium: 8.1 mg/dL — ABNORMAL LOW (ref 8.9–10.3)
Chloride: 102 mmol/L (ref 98–111)
Creatinine, Ser: 0.44 mg/dL (ref 0.44–1.00)
GFR, Estimated: >60 mL/min (ref >60)
Glucose, Bld: 166 mg/dL — ABNORMAL HIGH (ref 70–99)
Potassium: 4.2 mmol/L (ref 3.5–5.1)
Sodium: 137 mmol/L (ref 135–145)
Total Bilirubin: 4.9 mg/dL — ABNORMAL HIGH (ref 0.3–1.2)
Total Protein: 5.8 g/dL — ABNORMAL LOW (ref 6.5–8.1)

## 2020-12-07 SURGERY — ERCP, WITH INTERVENTION IF INDICATED
Anesthesia: General

## 2020-12-07 MED ORDER — SODIUM CHLORIDE 0.9 % IV SOLN
INTRAVENOUS | Status: DC | PRN
  Administered 2020-12-07 (×2): 250 mL via INTRAVENOUS

## 2020-12-07 MED ORDER — SUCCINYLCHOLINE CHLORIDE 200 MG/10ML IV SOSY
PREFILLED_SYRINGE | INTRAVENOUS | Status: DC | PRN
  Administered 2020-12-07: 60 mg via INTRAVENOUS

## 2020-12-07 MED ORDER — GLUCAGON HCL RDNA (DIAGNOSTIC) 1 MG IJ SOLR
INTRAMUSCULAR | Status: AC
  Filled 2020-12-07: qty 1

## 2020-12-07 MED ORDER — GLUCAGON HCL RDNA (DIAGNOSTIC) 1 MG IJ SOLR
INTRAMUSCULAR | Status: DC | PRN
  Administered 2020-12-07: .2 mg via INTRAVENOUS

## 2020-12-07 MED ORDER — MORPHINE SULFATE (PF) 2 MG/ML IV SOLN
2.0000 mg | INTRAVENOUS | Status: DC | PRN
  Administered 2020-12-07: 2 mg via INTRAVENOUS
  Filled 2020-12-07: qty 1

## 2020-12-07 MED ORDER — INDOMETHACIN 50 MG RE SUPP: 100.0000 mg | Freq: Once | RECTAL | Status: DC

## 2020-12-07 MED ORDER — ENOXAPARIN SODIUM 30 MG/0.3ML IJ SOSY: 30.0000 mg | PREFILLED_SYRINGE | INTRAMUSCULAR | Status: DC

## 2020-12-07 MED ORDER — PROPOFOL 10 MG/ML IV BOLUS
INTRAVENOUS | Status: DC | PRN
  Administered 2020-12-07: 40 mg via INTRAVENOUS

## 2020-12-07 MED ORDER — PHENYLEPHRINE HCL (PRESSORS) 10 MG/ML IV SOLN
INTRAVENOUS | Status: AC
  Filled 2020-12-07: qty 2

## 2020-12-07 MED ORDER — DEXTROSE IN LACTATED RINGERS 5 % IV SOLN: INTRAVENOUS | Status: DC

## 2020-12-07 MED ORDER — LIDOCAINE 2% (20 MG/ML) 5 ML SYRINGE
INTRAMUSCULAR | Status: DC | PRN
  Administered 2020-12-07: 40 mg via INTRAVENOUS

## 2020-12-07 MED ORDER — SODIUM CHLORIDE 0.9 % IV SOLN: INTRAVENOUS | Status: DC

## 2020-12-07 MED ORDER — FENTANYL CITRATE (PF) 100 MCG/2ML IJ SOLN
INTRAMUSCULAR | Status: DC | PRN
  Administered 2020-12-07: 25 ug via INTRAVENOUS

## 2020-12-07 MED ORDER — INDOMETHACIN 50 MG RE SUPP
RECTAL | Status: DC | PRN
  Administered 2020-12-07: 100 mg via RECTAL
  Administered 2020-12-07: 38 mg via RECTAL

## 2020-12-07 MED ORDER — EPHEDRINE SULFATE-NACL 50-0.9 MG/10ML-% IV SOSY
PREFILLED_SYRINGE | INTRAVENOUS | Status: DC | PRN
  Administered 2020-12-07 (×2): 5 mg via INTRAVENOUS

## 2020-12-07 MED ORDER — PHENYLEPHRINE HCL-NACL 20-0.9 MG/250ML-% IV SOLN
INTRAVENOUS | Status: DC | PRN
  Administered 2020-12-07: 25 ug/min via INTRAVENOUS

## 2020-12-07 MED ORDER — PROPOFOL 10 MG/ML IV BOLUS
INTRAVENOUS | Status: AC
  Filled 2020-12-07: qty 20

## 2020-12-07 MED ORDER — SUGAMMADEX SODIUM 200 MG/2ML IV SOLN
INTRAVENOUS | Status: DC | PRN
  Administered 2020-12-07: 80 mg via INTRAVENOUS

## 2020-12-07 MED ORDER — ENOXAPARIN SODIUM 30 MG/0.3ML IJ SOSY
30.0000 mg | PREFILLED_SYRINGE | INTRAMUSCULAR | Status: DC
  Administered 2020-12-08 – 2020-12-10 (×3): 30 mg via SUBCUTANEOUS
  Filled 2020-12-07 (×3): qty 0.3

## 2020-12-07 MED ORDER — INDOMETHACIN 50 MG RE SUPP
RECTAL | Status: AC
  Filled 2020-12-07: qty 2

## 2020-12-07 MED ORDER — FENTANYL CITRATE (PF) 100 MCG/2ML IJ SOLN
INTRAMUSCULAR | Status: AC
  Filled 2020-12-07: qty 2

## 2020-12-07 MED ORDER — ONDANSETRON HCL 4 MG/2ML IJ SOLN
INTRAMUSCULAR | Status: DC | PRN
  Administered 2020-12-07: 4 mg via INTRAVENOUS

## 2020-12-07 MED ORDER — LACTATED RINGERS IV SOLN: INTRAVENOUS | Status: DC | PRN

## 2020-12-07 MED ORDER — ROCURONIUM BROMIDE 10 MG/ML (PF) SYRINGE
PREFILLED_SYRINGE | INTRAVENOUS | Status: DC | PRN
  Administered 2020-12-07: 20 mg via INTRAVENOUS

## 2020-12-07 MED ORDER — PIPERACILLIN-TAZOBACTAM 3.375 G IVPB
3.3750 g | Freq: Three times a day (TID) | INTRAVENOUS | Status: DC
  Administered 2020-12-07 – 2020-12-08 (×4): 3.375 g via INTRAVENOUS
  Filled 2020-12-07 (×4): qty 50

## 2020-12-07 MED ORDER — PIPERACILLIN-TAZOBACTAM 3.375 G IVPB 30 MIN: 3.3750 g | Freq: Three times a day (TID) | INTRAVENOUS | Status: DC

## 2020-12-07 MED ORDER — ONDANSETRON HCL 4 MG PO TABS
4.0000 mg | ORAL_TABLET | Freq: Four times a day (QID) | ORAL | Status: DC | PRN
  Administered 2020-12-09: 4 mg via ORAL
  Filled 2020-12-07: qty 1

## 2020-12-07 MED ORDER — ONDANSETRON HCL 4 MG/2ML IJ SOLN
4.0000 mg | Freq: Four times a day (QID) | INTRAMUSCULAR | Status: DC | PRN
  Administered 2020-12-07 – 2020-12-09 (×3): 4 mg via INTRAVENOUS
  Filled 2020-12-07 (×3): qty 2

## 2020-12-07 NOTE — Progress Notes (Signed)
Received report from ED's RN but room was not ready. Housekeeping was still cleaning room. Bed was not available close to nursing station. Pt is 87 yrs old.  So let ed's Rn know about bring patient to the room after room get clean. For safety purpose patient placed close to nursing station.

## 2020-12-07 NOTE — Anesthesia Preprocedure Evaluation (Addendum)
Anesthesia Evaluation  Patient identified by MRN, date of birth, ID band Patient awake    Reviewed: Allergy & Precautions, H&P , NPO status , Patient's Chart, lab work & pertinent test results  Airway Mallampati: III  TM Distance: >3 FB Neck ROM: Full    Dental no notable dental hx. (+) Teeth Intact, Dental Advisory Given   Pulmonary neg pulmonary ROS,    Pulmonary exam normal breath sounds clear to auscultation       Cardiovascular negative cardio ROS   Rhythm:Regular Rate:Normal     Neuro/Psych negative neurological ROS  negative psych ROS   GI/Hepatic negative GI ROS, Neg liver ROS,   Endo/Other  negative endocrine ROS  Renal/GU negative Renal ROS  negative genitourinary   Musculoskeletal   Abdominal   Peds  Hematology  (+) Blood dyscrasia, anemia ,   Anesthesia Other Findings   Reproductive/Obstetrics negative OB ROS                            Anesthesia Physical Anesthesia Plan  ASA: 2  Anesthesia Plan: General   Post-op Pain Management:    Induction: Intravenous, Rapid sequence and Cricoid pressure planned  PONV Risk Score and Plan: 3 and Ondansetron and Dexamethasone  Airway Management Planned: Oral ETT  Additional Equipment:   Intra-op Plan:   Post-operative Plan: Extubation in OR  Informed Consent: I have reviewed the patients History and Physical, chart, labs and discussed the procedure including the risks, benefits and alternatives for the proposed anesthesia with the patient or authorized representative who has indicated his/her understanding and acceptance.     Dental advisory given  Plan Discussed with: CRNA  Anesthesia Plan Comments:        Anesthesia Quick Evaluation

## 2020-12-07 NOTE — Anesthesia Postprocedure Evaluation (Signed)
Anesthesia Post Note  Patient: Giannie K Haese  Procedure(s) Performed: ENDOSCOPIC RETROGRADE CHOLANGIOPANCREATOGRAPHY (ERCP) BILIARY STENT PLACEMENT     Patient location during evaluation: Endoscopy Anesthesia Type: General Level of consciousness: awake and alert Pain management: pain level controlled Vital Signs Assessment: post-procedure vital signs reviewed and stable Respiratory status: spontaneous breathing, nonlabored ventilation and respiratory function stable Cardiovascular status: blood pressure returned to baseline and stable Postop Assessment: no apparent nausea or vomiting Anesthetic complications: no   No notable events documented.  Last Vitals:  Vitals:   12/07/20 1400 12/07/20 1410  BP: (!) 109/56 (!) 111/54  Pulse: 83 83  Resp: 18 (!) 24  Temp:    SpO2: 99% 97%    Last Pain:  Vitals:   12/07/20 1400  TempSrc:   PainSc: 0-No pain                 Aretha Levi,W. EDMOND

## 2020-12-07 NOTE — ED Notes (Signed)
ED TO INPATIENT HANDOFF REPORT  ED Nurse Name and Phone #: Charise Carwin, RN [phone]  S Name/Age/Gender Alison Carpenter 87 y.o. female Room/Bed: WA22/WA22  Code Status   Code Status: Full Code  Home/SNF/Other Home Patient oriented to: self, place and situation Is this baseline? Yes   Triage Complete: Triage complete  Chief Complaint Biliary obstruction [K83.1]  Triage Note Pt BIB EMS from home. Pt reports abdominal pain that began today. Denies chest pain and N/V/D.     Allergies No Known Allergies  Level of Care/Admitting Diagnosis ED Disposition    ED Disposition  Admit   Condition  --   Comment  Hospital [facility]: [facility] [[id number]]  Level of Care: Med-Surg [16]  May admit patient to [facility] or [facility] if equivalent level of care is available:: No  Covid Evaluation: Asymptomatic Screening Protocol (No Symptoms)  Diagnosis: Biliary obstruction [088110]  Admitting Physician: Rometta Emery [[id number]]  Attending Physician: Rometta Emery [[id number]]  Estimated length of stay: past midnight tomorrow  Certification:: I certify this patient will need inpatient services for at least 2 midnights         B Medical/Surgery History History reviewed. No pertinent past medical history. Past Surgical History:  Procedure Laterality Date  . ABDOMINAL HYSTERECTOMY    . APPENDECTOMY    . CATARACT EXTRACTION, BILATERAL    . CHOLECYSTECTOMY    . FEMUR IM NAIL Left 06/10/2020   Procedure: INTRAMEDULLARY (IM) NAIL FEMORAL;  Surgeon: Eldred Manges, MD;  Location: [facility];  Service: Orthopedics;  Laterality: Left;     A IV Location/Drains/Wounds Patient Lines/Drains/Airways Status    Active Line/Drains/Airways    Name Placement date Placement time Site Days   Peripheral IV 12/06/20 20 G Left Forearm 12/06/20  1640  Forearm  1   Peripheral IV 12/06/20 22 G Right Forearm 12/06/20  1630  Forearm  1   Incision (Closed) 06/10/20 Hip Left 06/10/20   2015  -- 180          Intake/Output Last 24 hours  Intake/Output Summary (Last 24 hours) at 12/07/2020 0200 Last data filed at 12/07/2020 0106 Gross per 24 hour  Intake 1350 ml  Output --  Net 1350 ml    Labs/Imaging Results for orders placed or performed during the hospital encounter of 12/06/20 (from the past 48 hour(s))  CBC with Differential     Status: Abnormal   Collection Time: 12/06/20  3:51 PM  Result Value Ref Range   WBC 14.3 (H) 4.0 - 10.5 K/uL   RBC 4.60 3.87 - 5.11 MIL/uL   Hemoglobin 14.5 12.0 - 15.0 g/dL   HCT 31.5 94.5 - 85.9 %   MCV 99.6 80.0 - 100.0 fL   MCH 31.5 26.0 - 34.0 pg   MCHC 31.7 30.0 - 36.0 g/dL    Comment: CORRECTED FOR COLD AGGLUTININS   RDW 13.4 11.5 - 15.5 %   Platelets 183 150 - 400 K/uL   nRBC 0.0 0.0 - 0.2 %   Neutrophils Relative % 96 %   Neutro Abs 13.6 (H) 1.7 - 7.7 K/uL   Lymphocytes Relative 1 %   Lymphs Abs 0.2 (L) 0.7 - 4.0 K/uL   Monocytes Relative 3 %   Monocytes Absolute 0.4 0.1 - 1.0 K/uL   Eosinophils Relative 0 %   Eosinophils Absolute 0.0 0.0 - 0.5 K/uL   Basophils Relative 0 %   Basophils Absolute 0.1 0.0 - 0.1 K/uL   Immature  Granulocytes 0 %   Abs Immature Granulocytes 0.06 0.00 - 0.07 K/uL    Comment: Performed at [facility], [address] [place], [place] [address]  Comprehensive metabolic panel     Status: Abnormal   Collection Time: 12/06/20  3:51 PM  Result Value Ref Range   Sodium 136 135 - 145 mmol/L   Potassium 4.3 3.5 - 5.1 mmol/L   Chloride 99 98 - 111 mmol/L   CO2 28 22 - 32 mmol/L   Glucose, Bld 123 (H) 70 - 99 mg/dL    Comment: Glucose reference range applies only to samples taken after fasting for at least 8 hours.   BUN 12 8 - 23 mg/dL   Creatinine, Ser 4.69 0.44 - 1.00 mg/dL   Calcium 8.9 8.9 - 62.9 mg/dL   Total Protein 7.5 6.5 - 8.1 g/dL   Albumin 4.2 3.5 - 5.0 g/dL   AST 528 (H) 15 - 41 U/L   ALT 462 (H) 0 - 44 U/L   Alkaline Phosphatase 187 (H) 38 - 126 U/L    Total Bilirubin 3.2 (H) 0.3 - 1.2 mg/dL   GFR, Estimated >41 >32 mL/min    Comment: (NOTE) Calculated using the CKD-EPI Creatinine Equation (2021)    Anion gap 9 5 - 15    Comment: Performed at [facility], [address], [place], [place] [address]  Lipase, blood     Status: Abnormal   Collection Time: 12/06/20  3:51 PM  Result Value Ref Range   Lipase 101 (H) 11 - 51 U/L    Comment: Performed at [facility], [address], [place], [place] [address]  Urinalysis, Routine w reflex microscopic Urine, Clean Catch     Status: Abnormal   Collection Time: 12/06/20  3:51 PM  Result Value Ref Range   Color, Urine YELLOW YELLOW   APPearance CLEAR CLEAR   Specific Gravity, Urine 1.009 1.005 - 1.030   pH 8.0 5.0 - 8.0   Glucose, UA NEGATIVE NEGATIVE mg/dL   Hgb urine dipstick SMALL (A) NEGATIVE   Bilirubin Urine NEGATIVE NEGATIVE   Ketones, ur NEGATIVE NEGATIVE mg/dL   Protein, ur NEGATIVE NEGATIVE mg/dL   Nitrite NEGATIVE NEGATIVE   Leukocytes,Ua NEGATIVE NEGATIVE   RBC / HPF 0-5 0 - 5 RBC/hpf   WBC, UA 0-5 0 - 5 WBC/hpf   Bacteria, UA NONE SEEN NONE SEEN   Squamous Epithelial / LPF 0-5 0 - 5   Mucus PRESENT     Comment: Performed at [facility], [address] [place], [place] [address]  Troponin I (High Sensitivity)     Status: None   Collection Time: 12/06/20  3:51 PM  Result Value Ref Range   Troponin I (High Sensitivity) 7 <18 ng/L    Comment: (NOTE) Elevated high sensitivity troponin I (hsTnI) values and significant  changes across serial measurements may suggest ACS but many other  chronic and acute conditions are known to elevate hsTnI results.  Refer to the "Links" section for chest pain algorithms and additional  guidance. Performed at [facility], [address], [place], [place] [address]   Resp Panel by RT-PCR (Flu A&B, Covid) Nasopharyngeal Swab     Status: None   Collection Time:  12/06/20  4:56 PM   Specimen: Nasopharyngeal Swab; Nasopharyngeal(NP) swabs in vial transport medium  Result Value Ref Range   SARS Coronavirus 2 by RT PCR NEGATIVE NEGATIVE    Comment: (NOTE)  SARS-CoV-2 target nucleic acids are NOT DETECTED.  The SARS-CoV-2 RNA is generally detectable in upper respiratory specimens during the acute phase of infection. The lowest concentration of SARS-CoV-2 viral copies this assay can detect is 138 copies/mL. A negative result does not preclude SARS-Cov-2 infection and should not be used as the sole basis for treatment or other patient management decisions. A negative result may occur with  improper specimen collection/handling, submission of specimen other than nasopharyngeal swab, presence of viral mutation(s) within the areas targeted by this assay, and inadequate number of viral copies(<138 copies/mL). A negative result must be combined with clinical observations, patient history, and epidemiological information. The expected result is Negative.  Fact Sheet for Patients:  BloggerCourse.com  Fact Sheet for Healthcare Providers:  SeriousBroker.it  This test is no t yet approved or cleared by the [place] FDA and  has been authorized for detection and/or diagnosis of SARS-CoV-2 by FDA under an Emergency Use Authorization (EUA). This EUA will remain  in effect (meaning this test can be used) for the duration of the COVID-19 declaration under Section 564(b)(1) of the Act, 21 U.S.C.section 360bbb-3(b)(1), unless the authorization is terminated  or revoked sooner.       Influenza A by PCR NEGATIVE NEGATIVE   Influenza B by PCR NEGATIVE NEGATIVE    Comment: (NOTE) The Xpert Xpress SARS-CoV-2/FLU/RSV plus assay is intended as an aid in the diagnosis of influenza from Nasopharyngeal swab specimens and should not be used as a sole basis for treatment. Nasal washings and aspirates are  unacceptable for Xpert Xpress SARS-CoV-2/FLU/RSV testing.  Fact Sheet for Patients: BloggerCourse.com  Fact Sheet for Healthcare Providers: SeriousBroker.it  This test is not yet approved or cleared by the [place] FDA and has been authorized for detection and/or diagnosis of SARS-CoV-2 by FDA under an Emergency Use Authorization (EUA). This EUA will remain in effect (meaning this test can be used) for the duration of the COVID-19 declaration under Section 564(b)(1) of the Act, 21 U.S.C. section 360bbb-3(b)(1), unless the authorization is terminated or revoked.  Performed at [facility], [address] [place], [place] [address]   Troponin I (High Sensitivity)     Status: None   Collection Time: 12/06/20  6:29 PM  Result Value Ref Range   Troponin I (High Sensitivity) 9 <18 ng/L    Comment: (NOTE) Elevated high sensitivity troponin I (hsTnI) values and significant  changes across serial measurements may suggest ACS but many other  chronic and acute conditions are known to elevate hsTnI results.  Refer to the "Links" section for chest pain algorithms and additional  guidance. Performed at [facility], [address] [place], [place] [address]    DG Chest 1 View  Result Date: 12/06/2020 CLINICAL DATA:  Chest and abdominal pain. EXAM: CHEST  1 VIEW COMPARISON:  Remote radiograph 08/28/2006 FINDINGS: The heart is enlarged. Hilar prominence likely related to vascular structures, and similar to remote exam. There is aortic atherosclerosis. No pulmonary edema, focal airspace disease, or pneumothorax. There may be trace pleural effusions. No acute osseous abnormalities are seen. IMPRESSION: Cardiomegaly with possible trace pleural effusions. Electronically Signed   By: Narda Rutherford M.D.   On: 12/06/2020 18:45   CT ABDOMEN PELVIS W CONTRAST  Result Date: 12/06/2020 CLINICAL DATA:   Abdomen pain fever epigastric pain EXAM: CT ABDOMEN AND PELVIS WITH CONTRAST TECHNIQUE: Multidetector CT imaging of the abdomen and pelvis was performed using the standard protocol following bolus administration of intravenous  contrast. CONTRAST:  80mL OMNIPAQUE IOHEXOL 350 MG/ML SOLN COMPARISON:  ERCP report 02/28/2002 FINDINGS: Lower chest: Lung bases demonstrate no acute consolidation or effusion. Mild scarring at the bases. Mild cardiomegaly. Hepatobiliary: No focal hepatic abnormality. Gallbladder not clearly identified and is presumed surgically absent. Pneumobilia. Severe intra and extrahepatic biliary dilatation. Possible stone, debris, or lesion in the distal common bile duct, series 2, image 38. Pancreas: No inflammatory changes. Diffuse dilatation of the pancreatic duct. Pancreas is atrophic. Spleen: Normal in size without focal abnormality. Adrenals/Urinary Tract: Adrenal glands are normal. Kidneys show no hydronephrosis. Small stone in the right kidney. The right kidney is low lying, near the upper pelvis. The bladder is distended Stomach/Bowel: The stomach is nonenlarged. No dilated small bowel. No acute bowel wall thickening. Vascular/Lymphatic: Advanced aortic atherosclerosis. No aneurysm. No suspicious nodes. Reproductive: Status post hysterectomy.  No adnexal mass Other: No pelvic effusion or free air. Postsurgical changes of the anterior abdominal wall. Musculoskeletal: No acute or suspicious osseous abnormality. 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 IMPRESSION: 1. Status post cholecystectomy. Severe intra and extrahepatic biliary dilatation with dilatation of the pancreatic duct concerning for obstructive process, no obvious pancreas mass lesion seen. Possible increased density within the distal common bile duct which could be due to stones, debris, or other ductal lesion, consider correlation with MRCP. Pneumobilia is present, presumably due to prior sphincterotomy. Correlate with surgical history. 2.  Nonobstructing right kidney stone. Low lying right kidney in the upper pelvis. Electronically Signed   By: Jasmine PangKim  Fujinaga M.D.   On: 12/06/2020 20:04    Pending Labs Unresulted Labs (From admission, onward)    Start     Ordered   12/13/20 0500  Creatinine, serum  (enoxaparin (LOVENOX)    CrCl >/= 30 ml/min)  Weekly,   R     Comments: while on enoxaparin therapy    12/07/20 0029   12/07/20 0500  CBC  Tomorrow morning,   R        12/07/20 0029   12/07/20 0500  Comprehensive metabolic panel  Tomorrow morning,   R        12/07/20 0029          Vitals/Pain Today's Vitals   12/07/20 0030 12/07/20 0100 12/07/20 0130 12/07/20 0136  BP: 104/60 108/62 (!) 99/56   Pulse: 84 87 77   Resp: 10 17 20    Temp:   98.7 F (37.1 C)   TempSrc:   Oral   SpO2: 92% 95% 97%   PainSc:    4     Isolation Precautions Airborne and Contact precautions  Medications Medications  sodium chloride 0.9 % bolus 500 mL (0 mLs Intravenous Stopped 12/06/20 1728)    Followed by  0.9 %  sodium chloride infusion (0 mLs Intravenous Stopped 12/07/20 0106)  enoxaparin (LOVENOX) injection 30 mg (has no administration in time range)  dextrose 5 % in lactated ringers infusion ( Intravenous New Bag/Given 12/07/20 0105)  ondansetron (ZOFRAN) tablet 4 mg ( Oral See Alternative 12/07/20 0102)    Or  ondansetron (ZOFRAN) injection 4 mg (4 mg Intravenous Given 12/07/20 0102)  ketorolac (TORADOL) 15 MG/ML injection 15 mg (15 mg Intravenous Given 12/06/20 2302)  morphine 2 MG/ML injection 2 mg (2 mg Intravenous Given 12/07/20 0106)  ondansetron (ZOFRAN) injection 4 mg (4 mg Intravenous Given 12/06/20 1700)  iohexol (OMNIPAQUE) 350 MG/ML injection 80 mL (80 mLs Intravenous Contrast Given 12/06/20 1933)  morphine 2 MG/ML injection 2 mg (2 mg Intravenous Given 12/06/20 2156)  piperacillin-tazobactam (ZOSYN) IVPB 3.375 g (0 g Intravenous Stopped 12/06/20 2332)    Mobility walks with device Low fall risk   Focused  Assessments    R Recommendations: See Admitting Provider Note  Report given to:   Additional Notes:

## 2020-12-07 NOTE — Op Note (Addendum)
[facility] Patient Name: Alison Carpenter Procedure Date: 12/07/2020 MRN: [id number] Attending MD: Lynann Bologna , MD Date of Birth: 08-Aug-1917 CSN: [id number] Age: 87 Admit Type: Inpatient Procedure:                ERCP Indications:              Ascending cholangitis with WBC count 30K, abn LFTs.                            CT showing massively dilated biliary system. S/P                            remote cholecystectomy. Providers:                Lynann Bologna, MD, Estella Husk RN, RN, Melany Guernsey, Technician Referring MD:              Medicines:                General Anesthesia, IV Zosyn, Indocin                            suppositories, glucagon. Complications:            No immediate complications. Estimated Blood Loss:     Estimated blood loss: none. Procedure:                Pre-Anesthesia Assessment:                           - Prior to the procedure, a History and Physical                            was performed, and patient medications and                            allergies were reviewed. The patient's tolerance of                            previous anesthesia was also reviewed. The risks                            and benefits of the procedure and the sedation                            options and risks were discussed with the patient.                            All questions were answered, and informed consent                            was obtained. Prior Anticoagulants: The patient has                            taken no previous anticoagulant or  antiplatelet                            agents. ASA Grade Assessment: III - A patient with                            severe systemic disease. After reviewing the risks                            and benefits, the patient was deemed in                            satisfactory condition to undergo the procedure.                           After obtaining informed consent, the  scope was                            passed under direct vision. Throughout the                            procedure, the patient's blood pressure, pulse, and                            oxygen saturations were monitored continuously. The                            [id number]-Q180V ([id number] Olympus duodenoscope was                            introduced through the mouth, and used to inject                            contrast into and used to cannulate the bile duct.                            The ERCP was accomplished without difficulty. The                            patient tolerated the procedure well. Scope In: Scope Out: Findings:      The scout film was normal. The esophagus was successfully intubated       under direct vision. The scope was advanced to major papilla in the       descending duodenum without detailed examination of the pharynx, larynx       and associated structures, and upper GI tract. The upper GI tract was       grossly normal except for hiatal hernia.      There was evidence of previous limited sphincterotomy. The major papilla       was edematous. The bile duct was deeply cannulated with the short-nosed       traction sphincterotome and contrast injected. Massive dilatation of CBD       (3 cm) would make the interpretation of cholangiogram difficult. There       were at least 2 large filling defects (  2 cm, 1.5 cm) in the mid CBD c/w       choledocholithiasis. Significant pus extruded from the CBD c/w ascending       cholangitis.      Due to presence of large filling defects and pus consistent with       ascending cholangitis, we decided to hold off on attempts at stone       removal/lithotripsy and proceed with biliary stenting.      A 10 Fr 7 cm plastic stent was placed into the CBD. To anchor the stent       and ensure good drainage, another 10 Fr 5 cm stent was placed alongside       the previous stent. Both stents were in good position. Endoscopic and        radiologic documentation was obtained. There was drainage of bile mixed       with significant pus.      Pancreatic duct was intentionally not cannulated or injected. Impression:               - Massive CBD dilatation with choledocholithiasis.                           - Ascending cholangitis s/p Endo biliary stenting x                            2.                           - S/P previous cholecystectomy. Moderate Sedation:      Not Applicable - Patient had care per Anesthesia. Recommendation:           - Return patient to hospital ward for ongoing care.                           - Watch for pancreatitis, bleeding, perforation,                            and cholangitis.                           - Continue IV Zosyn for now.                           - Trend CBC, LFTs.                           - Clear liquid diet today. Advance diet in AM.                           - Once better, she would require MRCP as an                            outpatient followed by repeat ERCP (+/- EUS) in 6-8                            weeks with Dr. Meridee Score with attempt at  lithotripsy.                           - The findings and recommendations were discussed                            with the patient's family.                           - Clear liquid diet. Procedure Code(s):        --- Professional ---                           [phone], Endoscopic retrograde                            cholangiopancreatography (ERCP); with placement of                            endoscopic stent into biliary or pancreatic duct,                            including pre- and post-dilation and guide wire                            passage, when performed, including sphincterotomy,                            when performed, each stent                           43274, 59, Endoscopic retrograde                            cholangiopancreatography (ERCP); with placement of                             endoscopic stent into biliary or pancreatic duct,                            including pre- and post-dilation and guide wire                            passage, when performed, including sphincterotomy,                            when performed, each stent                           [id number], Endoscopic catheterization of the biliary                            ductal system, radiological supervision and                            interpretation Diagnosis Code(s):        --- Professional ---  R93.2, Abnormal findings on diagnostic imaging of                            liver and biliary tract CPT copyright 2019 American Medical Association. All rights reserved. The codes documented in this report are preliminary and upon coder review may  be revised to meet current compliance requirements. Lynann Bologna, MD 12/07/2020 1:52:28 PM This report has been signed electronically. Number of Addenda: 0

## 2020-12-07 NOTE — Transfer of Care (Signed)
Immediate Anesthesia Transfer of Care Note  Patient: Alison Carpenter  Procedure(s) Performed: ENDOSCOPIC RETROGRADE CHOLANGIOPANCREATOGRAPHY (ERCP) BILIARY STENT PLACEMENT  Patient Location: PACU and Endoscopy Unit  Anesthesia Type:General  Level of Consciousness: awake, alert , oriented and patient cooperative  Airway & Oxygen Therapy: Patient Spontanous Breathing and Patient connected to face mask oxygen  Post-op Assessment: Report given to RN, Post -op Vital signs reviewed and stable and Patient moving all extremities  Post vital signs: Reviewed and stable  Last Vitals:  Vitals Value Taken Time  BP    Temp    Pulse 92 12/07/20 1351  Resp 20 12/07/20 1352  SpO2 100 % 12/07/20 1351  Vitals shown include unvalidated device data.  Last Pain:  Vitals:   12/07/20 1208  TempSrc: Oral  PainSc: 0-No pain         Complications: No notable events documented.

## 2020-12-07 NOTE — ED Notes (Addendum)
SPO2 between 77-80% after administration of morphine. Patient placed on 2 L O2 via [place]. SPO2 improved to 99%

## 2020-12-07 NOTE — Consult Note (Addendum)
Referring Provider: Dr. Marye Round Primary Care Physician:  Lorene Dy, MD Primary Gastroenterologist:  Dr. Scarlette Shorts   Reason for Consultation:  Elevated LFTs, upper abdominal pain   HPI: Alison Carpenter is a 87 y.o. female with a past medical history of diverticulosis s/p colon resection,  + FOBT with associated iron deficiency anemia diagnosed during her hospital admission 05/2020 after she fell at home and injured her left hip s/p left trochanteric nail placement for a closed fracture 05/2020. Past cholecystectomy, hysterectomy and sigmoid resection secondary to diverticular disease.   She developed severe upper abdominal pain on 12/06/2020.  She had some nausea without vomiting.  She presented to the [facility] ED for further evaluation.  Labs in the ED showed a WBC level of 14.3.  Hemoglobin 14.5.  Hematocrit 45.8.  Platelet 183.  Alk phos 187.  Lipase 101.  Total bili 3.2.  AST 76.  ALT 462.  BUN 12.  Creatinine 0.5.  SARS coronavirus 2 negative.  Chest x-ray showed cardiomegaly with possible trace pleural effusions.  CTAP with contrast identified severe intra and extrahepatic biliary dilatation and pancreatic duct dilatation.  No obvious pancreatic mass was identified.  Possible increased density within the distal common bile duct.  Pneumobilia was present thought to be due to prior sphincterotomy.  Past cholecystectomy.  An abdominal MRI/MRCP was ordered by the hospitalist but not yet completed.  Currently, she is quite fatigued.  She denies having any chest pain or abdominal pain at this time.  No nausea or vomiting.  She developed upper abdominal pain yesterday which she stated was unlike any abdominal pain she is ever had.  No fever or chills.  She infrequently takes aspirin 325 mg once daily as needed for aches and pains.  She is passing normal darker brown formed stools daily, stools are darker since she started taking oral iron.  No rectal bleeding.  No obvious change  in urine color.  No pruritus.  No overt confusion.  No history of liver disease.  No alcohol use.  She reported losing about 30lbs since her hospital admission 05/2020.  She was seen by our GI service  06/14/2020 during her hospital admission s/p left hip/left trochanteric nail placement due to having a closed fracture ( fell at home) when she was found to have IDA.  Her admission hemoglobin at that time was 13.2 which dropped to 11.7 -> 8.5.  FOBT positive.  No melena or obvious rectal bleeding.  She received IV iron. She did not wish to pursue any endoscopic evaluation and her hemoglobin stabilized and she was discharged home on oral iron.  Colonoscopy by Dr. Henrene Pastor in 2006 identified multiple small nonbleeding AVMs in the cecum and anastomosis at 25 cm as well as diverticulosis  History reviewed. No pertinent past medical history.  Past Surgical History:  Procedure Laterality Date   ABDOMINAL HYSTERECTOMY     APPENDECTOMY     CATARACT EXTRACTION, BILATERAL     CHOLECYSTECTOMY     FEMUR IM NAIL Left 06/10/2020   Procedure: INTRAMEDULLARY (IM) NAIL FEMORAL;  Surgeon: Marybelle Killings, MD;  Location: [facility] ORS;  Service: Orthopedics;  Laterality: Left;    Prior to Admission medications   Medication Sig Start Date End Date Taking? Authorizing Provider  ferrous sulfate 325 (65 FE) MG tablet Take 1 tablet (325 mg total) by mouth daily. 06/16/20 09/14/20  Dessa Phi, DO  traMADol (ULTRAM) 50 MG tablet TAKE 1 TABLET BY MOUTH EVERY 12 HOURS AS  NEEDED. 07/05/20   Marybelle Killings, MD    Current Facility-Administered Medications  Medication Dose Route Frequency Provider Last Rate Last Admin   0.9 %  sodium chloride infusion  1,000 mL Intravenous Continuous Dorie Rank, MD   Stopped at 12/07/20 0106   dextrose 5 % in lactated ringers infusion   Intravenous Continuous Elwyn Reach, MD 125 mL/hr at 12/07/20 0105 New Bag at 12/07/20 0105   enoxaparin (LOVENOX) injection 30 mg  30 mg Subcutaneous Q24H Gala Romney L, MD       ketorolac (TORADOL) 15 MG/ML injection 15 mg  15 mg Intravenous Q6H PRN Gala Romney L, MD   15 mg at 12/06/20 2302   morphine 2 MG/ML injection 2 mg  2 mg Intravenous Q3H PRN Elwyn Reach, MD   2 mg at 12/07/20 0106   ondansetron (ZOFRAN) tablet 4 mg  4 mg Oral Q6H PRN Elwyn Reach, MD       Or   ondansetron (ZOFRAN) injection 4 mg  4 mg Intravenous Q6H PRN Gala Romney L, MD   4 mg at 12/07/20 0102   piperacillin-tazobactam (ZOSYN) IVPB 3.375 g  3.375 g Intravenous Q8H Elwyn Reach, MD        Allergies as of 12/06/2020   (No Known Allergies)    History reviewed. No pertinent family history.  Social History   Socioeconomic History   Marital status: Single    Spouse name: Not on file   Number of children: Not on file   Years of education: Not on file   Highest education level: Not on file  Occupational History   Not on file  Tobacco Use   Smoking status: Never   Smokeless tobacco: Never  Vaping Use   Vaping Use: Never used  Substance and Sexual Activity   Alcohol use: Never   Drug use: Never   Sexual activity: Not Currently  Other Topics Concern   Not on file  Social History Narrative   Not on file   Social Determinants of Health   Financial Resource Strain: Not on file  Food Insecurity: Not on file  Transportation Needs: Not on file  Physical Activity: Not on file  Stress: Not on file  Social Connections: Not on file  Intimate Partner Violence: Not on file    Review of Systems: Gen: Denies fever, sweats or chills. No weight loss.  CV: Denies chest pain, palpitations or edema. Resp: Denies cough, shortness of breath of hemoptysis.  GI: Denies heartburn, dysphagia, stomach or lower abdominal pain. No diarrhea or constipation. No rectal bleeding or melena.   GU : Denies urinary burning, blood in urine, increased urinary frequency or incontinence. MS: Denies joint pain, muscles aches or weakness. Derm: Denies rash,  itchiness, skin lesions or unhealing ulcers. Psych: Denies depression, anxiety, memory loss, suicidal ideation or confusion. Heme: Denies easy bruising, bleeding. Neuro:  Denies headaches, dizziness or paresthesias. Endo:  Denies any problems with DM, thyroid or adrenal function.  Physical Exam: Vital signs in last 24 hours: Temp:  [97.5 F (36.4 C)-98.9 F (37.2 C)] 97.5 F (36.4 C) (08/16 0515) Pulse Rate:  [67-107] 71 (08/16 0515) Resp:  [10-33] 15 (08/16 0515) BP: (99-164)/(55-93) 105/61 (08/16 0515) SpO2:  [90 %-99 %] 96 % (08/16 0515) Weight:  [39.3 kg] 39.3 kg (08/16 0341)   General:  Alert 87 year old female fatigued appearing in no acute distress. Head:  Normocephalic and atraumatic. Eyes: Scleral icterus present.  Conjunctiva pink. Ears:  Normal  auditory acuity. Nose:  No deformity, discharge or lesions. Mouth: Absent upper dentition.  No ulcers or lesions.  Neck:  Supple. No lymphadenopathy or thyromegaly.  Lungs: Breath sounds clear throughout. Heart: Regular rate and rhythm, no murmurs. Abdomen: Flat, nondistended.  Nontender.  Positive bowel sounds all 4 quadrants.  Midline lower abdominal scar intact.  No HSM. Rectal: Deferred. Musculoskeletal:  Symmetrical without gross deformities.  Pulses:  Normal pulses noted. Extremities: Bilateral pretibial areas with mild edema, thickened derm.  Feet slightly cool to touch. Neurologic:  Alert and  oriented x4. No focal deficits.  Skin:  Intact without significant lesions or rashes. Psych:  Alert and cooperative. Normal mood and affect.  Intake/Output from previous day: 08/15 0701 - 08/16 0700 In: 2105.5 [I.V.:1555.5; IV Piggyback:550] Out: 0  Intake/Output this shift: No intake/output data recorded.  Lab Results: Recent Labs    12/06/20 1551 12/07/20 0350  WBC 14.3* 30.9*  HGB 14.5 12.6  HCT 45.8 37.3  PLT 183 146*   BMET Recent Labs    12/06/20 1551 12/07/20 0350  NA 136 137  K 4.3 4.2  CL 99 102   CO2 28 22  GLUCOSE 123* 166*  BUN 12 12  CREATININE 0.53 0.44  CALCIUM 8.9 8.1*   LFT Recent Labs    12/07/20 0350  PROT 5.8*  ALBUMIN 3.1*  AST 392*  ALT 356*  ALKPHOS 137*  BILITOT 4.9*   PT/INR No results for input(s): LABPROT, INR in the last 72 hours. Hepatitis Panel No results for input(s): HEPBSAG, HCVAB, HEPAIGM, HEPBIGM in the last 72 hours.    Studies/Results: DG Chest 1 View  Result Date: 12/06/2020 CLINICAL DATA:  Chest and abdominal pain. EXAM: CHEST  1 VIEW COMPARISON:  Remote radiograph 08/28/2006 FINDINGS: The heart is enlarged. Hilar prominence likely related to vascular structures, and similar to remote exam. There is aortic atherosclerosis. No pulmonary edema, focal airspace disease, or pneumothorax. There may be trace pleural effusions. No acute osseous abnormalities are seen. IMPRESSION: Cardiomegaly with possible trace pleural effusions. Electronically Signed   By: Keith Rake M.D.   On: 12/06/2020 18:45   CT ABDOMEN PELVIS W CONTRAST  Result Date: 12/06/2020 CLINICAL DATA:  Abdomen pain fever epigastric pain EXAM: CT ABDOMEN AND PELVIS WITH CONTRAST TECHNIQUE: Multidetector CT imaging of the abdomen and pelvis was performed using the standard protocol following bolus administration of intravenous contrast. CONTRAST:  81m OMNIPAQUE IOHEXOL 350 MG/ML SOLN COMPARISON:  ERCP report 02/28/2002 FINDINGS: Lower chest: Lung bases demonstrate no acute consolidation or effusion. Mild scarring at the bases. Mild cardiomegaly. Hepatobiliary: No focal hepatic abnormality. Gallbladder not clearly identified and is presumed surgically absent. Pneumobilia. Severe intra and extrahepatic biliary dilatation. Possible stone, debris, or lesion in the distal common bile duct, series 2, image 38. Pancreas: No inflammatory changes. Diffuse dilatation of the pancreatic duct. Pancreas is atrophic. Spleen: Normal in size without focal abnormality. Adrenals/Urinary Tract: Adrenal  glands are normal. Kidneys show no hydronephrosis. Small stone in the right kidney. The right kidney is low lying, near the upper pelvis. The bladder is distended Stomach/Bowel: The stomach is nonenlarged. No dilated small bowel. No acute bowel wall thickening. Vascular/Lymphatic: Advanced aortic atherosclerosis. No aneurysm. No suspicious nodes. Reproductive: Status post hysterectomy.  No adnexal mass Other: No pelvic effusion or free air. Postsurgical changes of the anterior abdominal wall. Musculoskeletal: No acute or suspicious osseous abnormality. 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 IMPRESSION: 1. Status post cholecystectomy. Severe intra and extrahepatic biliary dilatation with dilatation of  the pancreatic duct concerning for obstructive process, no obvious pancreas mass lesion seen. Possible increased density within the distal common bile duct which could be due to stones, debris, or other ductal lesion, consider correlation with MRCP. Pneumobilia is present, presumably due to prior sphincterotomy. Correlate with surgical history. 2. Nonobstructing right kidney stone. Low lying right kidney in the upper pelvis. Electronically Signed   By: Donavan Foil M.D.   On: 12/06/2020 20:04    IMPRESSION/PLAN:  52) 87 year old female with acute upper abdominal pain and elevated LFTs consistent with acute cholestasis.  CTAP identified severe intra and extrahepatic biliary ductal dilatation, pancreatic duct dilatation concerning for obstructive process without a discrete pancreatic mass.  Possible increased density within the common bile duct stone which could not all be due to choledocholithiasis or other ductal lesion.  Abdominal MRI/MRCP ordered, not yet completed. Alk phos 187 -> 137. T. Bili 3.2 -> 4.9. AST 786 -> 356. ALT 462 -> 356. Leukocytosis 14.3 -> 30.9 concerning for ascending cholangitis.  He is afebrile.  Heart rate 71.  Blood pressure 105/69. -Cancel abdominal MRI/MRCP order, patient to proceed with ERCP  with Dr. Lyndel Safe today. ERCP benefits and risks discussed including risk with sedation, risk of bleeding, perforation, pancreatitis and infection  -NPO -Hold Lovenox  -Continue IV fluids 125cc/hr -Continue Zosyn 3.365m IV Q 8 hours  -Pain management per the hospitalist -Zofran 4 mg p.o. or IV 6 hours. -Pantoprazole 427mIV Q 24 hours  2) History of IDA on oral iron. Hg 12.6. No overt GI bleeding    CoNoralyn Pick8/16/2022, 08:32 AM    Attending physician's note   I have taken an interval history, reviewed the chart and examined the patient. I agree with the Advanced Practitioner's note, impression and recommendations.   Obstructive jaundice with abn LFTs. CT AP with severe biliary dilatation.  Also shows PD dilatation.  No definite mass. CT also suggestive of choledocholithiasis. Ascending cholangitis H/O remote cholecystectomy  Plan: -IV Zosyn -Urgent ERCP with possible stenting. -Further evaluation thereafter (if needed) -Trend CBC, LFTs.  Pt very HOH. I have explained risks and benefits including small but definite risks of pancreatitis (<10%), bleeding (<1%), perforation (<1%).  The risks of general anesthesia were also discussed.  The benefits were also discussed.  Patient wishes to proceed.  All the questions were answered.  Higher than baseline risk d/t advanced age, multiple comorbidities.   RaCarmell AustriaMD LeVelora HecklerI [phone]

## 2020-12-07 NOTE — Anesthesia Procedure Notes (Signed)
Procedure Name: Intubation Date/Time: 12/07/2020 12:41 PM Performed by: Victoriano Lain, CRNA Pre-anesthesia Checklist: Patient identified, Emergency Drugs available, Suction available, Timeout performed and Patient being monitored Patient Re-evaluated:Patient Re-evaluated prior to induction Oxygen Delivery Method: Circle system utilized Preoxygenation: Pre-oxygenation with 100% oxygen Induction Type: IV induction, Rapid sequence and Cricoid Pressure applied Laryngoscope Size: Mac and 3 Grade View: Grade I Tube type: Oral Tube size: 7.0 mm Number of attempts: 1 Airway Equipment and Method: Stylet Placement Confirmation: ETT inserted through vocal cords under direct vision, positive ETCO2 and breath sounds checked- equal and bilateral Secured at: 21 cm Tube secured with: Tape Dental Injury: Teeth and Oropharynx as per pre-operative assessment

## 2020-12-07 NOTE — Progress Notes (Signed)
Progress Note    Alison Carpenter  EHO:[id number] DOB: 02-Jun-1917  DOA: 12/06/2020 PCP: Burton Apley, MD    Brief Narrative:     Medical records reviewed and are as summarized below:  Alison Carpenter is an 87 y.o. female with medical history significant of cholecystitis s/p cholecystectomy, iron deficiency anemia, history of nondisplaced intertrochanteric fracture, generalized deconditioning who was brought in from home secondary to sudden onset of abdominal pain nausea vomiting today. She had markedly elevated LFTs.  Imaging showed dilated common bile duct.  Going for ERCP  Assessment/Plan:   Principal Problem:   Biliary obstruction Active Problems:   Iron deficiency anemia   Pressure injury of skin   Abdominal pain with Elevated LFTS: No obvious cause identified.  Most likely stones debris or even malignancy.  -GI consult -getting ERCP Today -NPO -IV abx   Leukocytosis -trend  iron deficiency anemia:  -Monitor H&H   history of cholecystectomy: Stable.    Family Communication/Anticipated D/C date and plan/Code Status   DVT prophylaxis: Lovenox ordered. Code Status: Full Code.  Disposition Plan: Status is: Inpatient  Remains inpatient appropriate because:Inpatient level of care appropriate due to severity of illness  Dispo: The patient is from: Home              Anticipated d/c is to: Home              Patient currently is not medically stable to d/c.   Difficult to place patient No         Medical Consultants:   GI    Subjective:   Having some nausea this AM  Objective:    Vitals:   12/07/20 0300 12/07/20 0341 12/07/20 0515 12/07/20 1027  BP: (!) 102/55 123/65 105/61 (!) 101/53  Pulse: 71 67 71 61  Resp: 15 14 15 18   Temp: 98.4 F (36.9 C) (!) 97.5 F (36.4 C) (!) 97.5 F (36.4 C) 98 F (36.7 C)  TempSrc: Oral Oral Oral Oral  SpO2: 93% 92% 96% 98%  Weight:  39.3 kg    Height:  5\' 3"  (1.6 m)      Intake/Output  Summary (Last 24 hours) at 12/07/2020 1207 Last data filed at 12/07/2020 1000 Gross per 24 hour  Intake 2105.54 ml  Output 0 ml  Net 2105.54 ml   Filed Weights   12/07/20 0341  Weight: 39.3 kg    Exam:  General: Appearance:    Thin female in no acute distress     Lungs:     respirations unlabored  Heart:    Normal heart rate.    MS:   All extremities are intact.    Neurologic:   Awake, alert, hard of hearing     Data Reviewed:   I have personally reviewed following labs and imaging studies:  Labs: Labs show the following:   Basic Metabolic Panel: Recent Labs  Lab 12/06/20 1551 12/07/20 0350  NA 136 137  K 4.3 4.2  CL 99 102  CO2 28 22  GLUCOSE 123* 166*  BUN 12 12  CREATININE 0.53 0.44  CALCIUM 8.9 8.1*   GFR Estimated Creatinine Clearance: 22.6 mL/min (by C-G formula based on SCr of 0.44 mg/dL). Liver Function Tests: Recent Labs  Lab 12/06/20 1551 12/07/20 0350  AST 786* 392*  ALT 462* 356*  ALKPHOS 187* 137*  BILITOT 3.2* 4.9*  PROT 7.5 5.8*  ALBUMIN 4.2 3.1*   Recent Labs  Lab 12/06/20 1551  LIPASE 101*  No results for input(s): AMMONIA in the last 168 hours. Coagulation profile No results for input(s): INR, PROTIME in the last 168 hours.  CBC: Recent Labs  Lab 12/06/20 1551 12/07/20 0350  WBC 14.3* 30.9*  NEUTROABS 13.6*  --   HGB 14.5 12.6  HCT 45.8 37.3  MCV 99.6 95.6  PLT 183 146*   Cardiac Enzymes: No results for input(s): CKTOTAL, CKMB, CKMBINDEX, TROPONINI in the last 168 hours. BNP (last 3 results) No results for input(s): PROBNP in the last 8760 hours. CBG: No results for input(s): GLUCAP in the last 168 hours. D-Dimer: No results for input(s): DDIMER in the last 72 hours. Hgb A1c: No results for input(s): HGBA1C in the last 72 hours. Lipid Profile: No results for input(s): CHOL, HDL, LDLCALC, TRIG, CHOLHDL, LDLDIRECT in the last 72 hours. Thyroid function studies: No results for input(s): TSH, T4TOTAL, T3FREE,  THYROIDAB in the last 72 hours.  Invalid input(s): FREET3 Anemia work up: No results for input(s): VITAMINB12, FOLATE, FERRITIN, TIBC, IRON, RETICCTPCT in the last 72 hours. Sepsis Labs: Recent Labs  Lab 12/06/20 1551 12/07/20 0350  WBC 14.3* 30.9*    Microbiology Recent Results (from the past 240 hour(s))  Resp Panel by RT-PCR (Flu A&B, Covid) Nasopharyngeal Swab     Status: None   Collection Time: 12/06/20  4:56 PM   Specimen: Nasopharyngeal Swab; Nasopharyngeal(NP) swabs in vial transport medium  Result Value Ref Range Status   SARS Coronavirus 2 by RT PCR NEGATIVE NEGATIVE Final    Comment: (NOTE) SARS-CoV-2 target nucleic acids are NOT DETECTED.  The SARS-CoV-2 RNA is generally detectable in upper respiratory specimens during the acute phase of infection. The lowest concentration of SARS-CoV-2 viral copies this assay can detect is 138 copies/mL. A negative result does not preclude SARS-Cov-2 infection and should not be used as the sole basis for treatment or other patient management decisions. A negative result may occur with  improper specimen collection/handling, submission of specimen other than nasopharyngeal swab, presence of viral mutation(s) within the areas targeted by this assay, and inadequate number of viral copies(<138 copies/mL). A negative result must be combined with clinical observations, patient history, and epidemiological information. The expected result is Negative.  Fact Sheet for Patients:  BloggerCourse.com  Fact Sheet for Healthcare Providers:  SeriousBroker.it  This test is no t yet approved or cleared by the [place] FDA and  has been authorized for detection and/or diagnosis of SARS-CoV-2 by FDA under an Emergency Use Authorization (EUA). This EUA will remain  in effect (meaning this test can be used) for the duration of the COVID-19 declaration under Section 564(b)(1) of the Act,  21 U.S.C.section 360bbb-3(b)(1), unless the authorization is terminated  or revoked sooner.       Influenza A by PCR NEGATIVE NEGATIVE Final   Influenza B by PCR NEGATIVE NEGATIVE Final    Comment: (NOTE) The Xpert Xpress SARS-CoV-2/FLU/RSV plus assay is intended as an aid in the diagnosis of influenza from Nasopharyngeal swab specimens and should not be used as a sole basis for treatment. Nasal washings and aspirates are unacceptable for Xpert Xpress SARS-CoV-2/FLU/RSV testing.  Fact Sheet for Patients: BloggerCourse.com  Fact Sheet for Healthcare Providers: SeriousBroker.it  This test is not yet approved or cleared by the [place] FDA and has been authorized for detection and/or diagnosis of SARS-CoV-2 by FDA under an Emergency Use Authorization (EUA). This EUA will remain in effect (meaning this test can be used) for the duration of the COVID-19 declaration under  Section 564(b)(1) of the Act, 21 U.S.C. section 360bbb-3(b)(1), unless the authorization is terminated or revoked.  Performed at [facility], [address], [place], [place] [address]     Procedures and diagnostic studies:  DG Chest 1 View  Result Date: 12/06/2020 CLINICAL DATA:  Chest and abdominal pain. EXAM: CHEST  1 VIEW COMPARISON:  Remote radiograph 08/28/2006 FINDINGS: The heart is enlarged. Hilar prominence likely related to vascular structures, and similar to remote exam. There is aortic atherosclerosis. No pulmonary edema, focal airspace disease, or pneumothorax. There may be trace pleural effusions. No acute osseous abnormalities are seen. IMPRESSION: Cardiomegaly with possible trace pleural effusions. Electronically Signed   By: Narda Rutherford M.D.   On: 12/06/2020 18:45   CT ABDOMEN PELVIS W CONTRAST  Result Date: 12/06/2020 CLINICAL DATA:  Abdomen pain fever epigastric pain EXAM: CT ABDOMEN AND PELVIS WITH CONTRAST  TECHNIQUE: Multidetector CT imaging of the abdomen and pelvis was performed using the standard protocol following bolus administration of intravenous contrast. CONTRAST:  52mL OMNIPAQUE IOHEXOL 350 MG/ML SOLN COMPARISON:  ERCP report 02/28/2002 FINDINGS: Lower chest: Lung bases demonstrate no acute consolidation or effusion. Mild scarring at the bases. Mild cardiomegaly. Hepatobiliary: No focal hepatic abnormality. Gallbladder not clearly identified and is presumed surgically absent. Pneumobilia. Severe intra and extrahepatic biliary dilatation. Possible stone, debris, or lesion in the distal common bile duct, series 2, image 38. Pancreas: No inflammatory changes. Diffuse dilatation of the pancreatic duct. Pancreas is atrophic. Spleen: Normal in size without focal abnormality. Adrenals/Urinary Tract: Adrenal glands are normal. Kidneys show no hydronephrosis. Small stone in the right kidney. The right kidney is low lying, near the upper pelvis. The bladder is distended Stomach/Bowel: The stomach is nonenlarged. No dilated small bowel. No acute bowel wall thickening. Vascular/Lymphatic: Advanced aortic atherosclerosis. No aneurysm. No suspicious nodes. Reproductive: Status post hysterectomy.  No adnexal mass Other: No pelvic effusion or free air. Postsurgical changes of the anterior abdominal wall. Musculoskeletal: No acute or suspicious osseous abnormality. 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 IMPRESSION: 1. Status post cholecystectomy. Severe intra and extrahepatic biliary dilatation with dilatation of the pancreatic duct concerning for obstructive process, no obvious pancreas mass lesion seen. Possible increased density within the distal common bile duct which could be due to stones, debris, or other ductal lesion, consider correlation with MRCP. Pneumobilia is present, presumably due to prior sphincterotomy. Correlate with surgical history. 2. Nonobstructing right kidney stone. Low lying right kidney in the upper  pelvis. Electronically Signed   By: Jasmine Pang M.D.   On: 12/06/2020 20:04    Medications:    [MAR Hold] enoxaparin (LOVENOX) injection  30 mg Subcutaneous Q24H   Continuous Infusions:  sodium chloride Stopped (12/07/20 0106)   [MAR Hold] sodium chloride 250 mL (12/07/20 0842)   dextrose 5% lactated ringers 125 mL/hr at 12/07/20 0848   [MAR Hold] piperacillin-tazobactam (ZOSYN)  IV Stopped (12/07/20 1150)     LOS: 1 day   Joseph Art  [facility]   How to contact the [facility] Attending or Consulting provider 7A - 7P or covering provider during after hours 7P -7A, for this patient?  Check the care team in [facility] and look for a) attending/consulting [facility] provider listed and b) the [facility] team listed Log into [email or web address] and use [place] universal password to access. If you do not have the password, please contact the hospital operator. Locate the [facility] provider you are looking for under [facility] and page to a number that  you can be directly reached. If you still have difficulty reaching the provider, please page the [facility] (Director on Call) for the Hospitalists listed on amion for assistance.  12/07/2020, 12:07 PM

## 2020-12-08 ENCOUNTER — Telehealth: Payer: Self-pay

## 2020-12-08 ENCOUNTER — Encounter (HOSPITAL_COMMUNITY): Payer: Self-pay | Admitting: Gastroenterology

## 2020-12-08 DIAGNOSIS — K831 Obstruction of bile duct: Secondary | ICD-10-CM

## 2020-12-08 DIAGNOSIS — K8050 Calculus of bile duct without cholangitis or cholecystitis without obstruction: Secondary | ICD-10-CM

## 2020-12-08 DIAGNOSIS — K8020 Calculus of gallbladder without cholecystitis without obstruction: Secondary | ICD-10-CM

## 2020-12-08 LAB — COMPREHENSIVE METABOLIC PANEL
ALT: 235 U/L — ABNORMAL HIGH (ref 0–44)
AST: 143 U/L — ABNORMAL HIGH (ref 15–41)
Albumin: 2.8 g/dL — ABNORMAL LOW (ref 3.5–5.0)
Alkaline Phosphatase: 111 U/L (ref 38–126)
Anion gap: 8 (ref 5–15)
BUN: 24 mg/dL — ABNORMAL HIGH (ref 8–23)
CO2: 26 mmol/L (ref 22–32)
Calcium: 8.1 mg/dL — ABNORMAL LOW (ref 8.9–10.3)
Chloride: 103 mmol/L (ref 98–111)
Creatinine, Ser: 0.81 mg/dL (ref 0.44–1.00)
GFR, Estimated: >60 mL/min (ref >60)
Glucose, Bld: 86 mg/dL (ref 70–99)
Potassium: 4.1 mmol/L (ref 3.5–5.1)
Sodium: 137 mmol/L (ref 135–145)
Total Bilirubin: 2.2 mg/dL — ABNORMAL HIGH (ref 0.3–1.2)
Total Protein: 5 g/dL — ABNORMAL LOW (ref 6.5–8.1)

## 2020-12-08 LAB — CBC
HCT: 36.1 % (ref 36.0–46.0)
Hemoglobin: 11.5 g/dL — ABNORMAL LOW (ref 12.0–15.0)
MCH: 31.7 pg (ref 26.0–34.0)
MCHC: 31.9 g/dL (ref 30.0–36.0)
MCV: 99.4 fL (ref 80.0–100.0)
Platelets: 173 10*3/uL (ref 150–400)
RBC: 3.63 MIL/uL — ABNORMAL LOW (ref 3.87–5.11)
RDW: 13.8 % (ref 11.5–15.5)
WBC: 19 10*3/uL — ABNORMAL HIGH (ref 4.0–10.5)
nRBC: 0 % (ref 0.0–0.2)

## 2020-12-08 LAB — LIPASE, BLOOD: Lipase: 26 U/L (ref 11–51)

## 2020-12-08 LAB — BILIRUBIN, DIRECT: Bilirubin, Direct: 1.3 mg/dL — ABNORMAL HIGH (ref 0.0–0.2)

## 2020-12-08 MED ORDER — METRONIDAZOLE 500 MG/100ML IV SOLN
500.0000 mg | Freq: Two times a day (BID) | INTRAVENOUS | Status: DC
  Administered 2020-12-08 – 2020-12-09 (×2): 500 mg via INTRAVENOUS
  Filled 2020-12-08 (×2): qty 100

## 2020-12-08 MED ORDER — SODIUM CHLORIDE 0.9 % IV SOLN
2.0000 g | INTRAVENOUS | Status: DC
  Administered 2020-12-08: 2 g via INTRAVENOUS
  Filled 2020-12-08: qty 2

## 2020-12-08 NOTE — Evaluation (Signed)
.imprPhysical Therapy Evaluation Patient Details Name: Alison Carpenter MRN: [id number] DOB: [birth year] Today's Date: 12/08/2020   History of Present Illness  Pt is an 87 y.o. female who presented to [facility] with sever upper abdominal pain and nausea admitted for biliary obstruction. PMH significant for cholecystitis s/p cholecystectomy, iron deficiency anemia, history of nondisplaced L intertrochanteric fracture following fall- s/p Lt IM nail 2/22, and generalized deconditioning.  Clinical Impression  Pt is a 87 y.o. female with above listed HPI. 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 Pt required MIN A from PT during ambulation ~68ft in room with use of single UE support on IV pole for improved stability. Pt has 2 daughters who live ~36min away and are available to assist when needed. Pt is currently below baseline mobility level and will benefit from continued skilled PT to increase independence and maximize safety with mobility.      Follow Up Recommendations Supervision/Assistance - 24 hour;Home health PT    Equipment Recommendations  None recommended by PT (pt has assistive devices at home if needed)    Recommendations for Other Services       Precautions / Restrictions Precautions Precautions: Fall Restrictions Weight Bearing Restrictions: No      Mobility  Bed Mobility Overal bed mobility: Needs Assistance Bed Mobility: Supine to Sit     Supine to sit: Supervision;HOB elevated     General bed mobility comments: supervision for safety    Transfers Overall transfer level: Needs assistance Equipment used: 1 person hand held assist Transfers: Sit to/from Stand Sit to Stand: Min guard         General transfer comment: MIN guard provided for safety/stability  Ambulation/Gait Ambulation/Gait assistance: Min assist Gait Distance (Feet): 50 Feet Assistive device: IV Pole Gait  Pattern/deviations: Step-through pattern;Decreased stride length Gait velocity: decr   General Gait Details: MIN A for stability/balance with ambulation around room. Pt with use of IV pole for stability stating "my legs feel weak." Trialed without use of IV pole and pt noted to have increased unsteadiness. Pt stated she feels more stable with use of IV pole due to weakness. Pt more comfortable ambulating around room as opposed to hallway.  Stairs            Wheelchair Mobility    Modified Rankin (Stroke Patients Only)       Balance Overall balance assessment: Needs assistance Sitting-balance support: Feet supported Sitting balance-Leahy Scale: Fair     Standing balance support: Single extremity supported Standing balance-Leahy Scale: Fair Standing balance comment: use of single UE support                             Pertinent Vitals/Pain Pain Assessment: No/denies pain    Home Living Family/patient expects to be discharged to:: Private residence Living Arrangements: Children Available Help at Discharge: Family;Available 24 hours/day Type of Home: House Home Access: Stairs to enter Entrance Stairs-Rails: None Entrance Stairs-Number of Steps: 1 Home Layout: One level Home Equipment: Walker - 2 wheels;Wheelchair - manual;Grab bars - tub/shower;Grab bars - toilet;Shower seat - built in;Cane - single point Additional Comments: Pt lives with oldest daughter whom she is a caregiver for, daughter has dementia . Other 2 daughters live 5 min away and can provide assist as needed. Pt works as a Merchandiser, retail.    Prior Function Level of Independence: Independent  Hand Dominance   Dominant Hand: Right    Extremity/Trunk Assessment   Upper Extremity Assessment Upper Extremity Assessment: Generalized weakness    Lower Extremity Assessment Lower Extremity Assessment: Generalized weakness       Communication   Communication: HOH  Cognition  Arousal/Alertness: Awake/alert Behavior During Therapy: WFL for tasks assessed/performed Overall Cognitive Status: Within Functional Limits for tasks assessed                                        General Comments General comments (skin integrity, edema, etc.): pt on 2L [place] upon entry, O2 low of 92% during ambulation with recovery to 94% at EOS on RA, RN notified.    Exercises     Assessment/Plan    PT Assessment Patient needs continued PT services  PT Problem List Decreased strength;Decreased activity tolerance;Decreased balance;Decreased mobility       PT Treatment Interventions DME instruction;Gait training;Stair training;Functional mobility training;Therapeutic activities;Therapeutic exercise;Patient/family education;Balance training    PT Goals (Current goals can be found in the Care Plan section)  Acute Rehab PT Goals Patient Stated Goal: get back to independence PT Goal Formulation: With patient/family Time For Goal Achievement: 12/22/20 Potential to Achieve Goals: Good    Frequency Min 3X/week   Barriers to discharge        Co-evaluation               AM-PAC PT "6 Clicks" Mobility  Outcome Measure Help needed turning from your back to your side while in a flat bed without using bedrails?: None Help needed moving from lying on your back to sitting on the side of a flat bed without using bedrails?: A Little Help needed moving to and from a bed to a chair (including a wheelchair)?: A Little Help needed standing up from a chair using your arms (e.g., wheelchair or bedside chair)?: A Little Help needed to walk in hospital room?: A Little Help needed climbing 3-5 steps with a railing? : A Lot 6 Click Score: 18    End of Session Equipment Utilized During Treatment: Gait belt Activity Tolerance: Patient tolerated treatment well Patient left: in chair;with call bell/phone within reach;with chair alarm set;with family/visitor present Nurse  Communication: Mobility status PT Visit Diagnosis: Unsteadiness on feet (R26.81);Muscle weakness (generalized) (M62.81)    Time: 0037-0488 PT Time Calculation (min) (ACUTE ONLY): 30 min   Charges:     PT Treatments $Gait Training: 8-22 mins       Jetta Lout, DPT  Acute Rehabilitation Services  Office [phone]  Leotis Shames J Janira Mandell 12/08/2020, 1:00 PM

## 2020-12-08 NOTE — Progress Notes (Addendum)
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 in 2006  Developed abdominal pain 12/06/2020 with nausea Xarelto no vomit Found to have elevated lipase and LFTs-CT scan showed biliary ductal dilatation GI consulted Admitted and found to have ascending cholangitis  Hospital-Problem based course Ascending cholangitis Status post Endo biliary stenting X2 (10 [place] 7 cm, 10 French 5 cm additionally) Antibiotics cefepime/Flagyl as per GI--improving Chronic normocytic anemia with possibility of AVM slow blood loss Stable at this time--has declined work-up in the past AKI secondary to ATN Probably not drinking enough-cont IVF 125 cc/H for 18 hours Labs am Stage 1 decibuitus ulcer Turn as able q 2 hours   DVT prophylaxis: Lovenox Code Status: Full Family Communication: Discussed in detail with daughter at the bedside Joyice Faster [phone] Disposition:  Status is: Inpatient  Remains inpatient appropriate because:Hemodynamically unstable, Ongoing diagnostic testing needed not appropriate for outpatient work up, and Unsafe d/c plan  Dispo: The patient is from: Home              Anticipated d/c is to: Home              Patient currently is not medically stable to d/c.   Difficult to place patient No       Consultants:  Gastroenterology  Procedures: None  Antimicrobials: As above   Subjective: "I feel better" tolerating some p.o. diet but not much Pain seems improved Has not been out of bed yet Remarkably coherent oriented considering advanced age  Objective: Vitals:   12/07/20 1503 12/07/20 2157 12/08/20 0453 12/08/20 1340  BP: 116/63 (!) 102/56 124/69 114/65  Pulse: 62 79 87 72  Resp: 12 15 18 18    Temp:  98 F (36.7 C) 98.3 F (36.8 C) 98.6 F (37 C)  TempSrc:    Oral  SpO2: 100% 97% 98% 95%  Weight:      Height:        Intake/Output Summary (Last 24 hours) at 12/08/2020 1455 Last data filed at 12/08/2020 1400 Gross per 24 hour  Intake 645.66 ml  Output 100 ml  Net 545.66 ml   Filed Weights   12/07/20 0341  Weight: 39.3 kg    Examination:  EOMI NCAT looking much younger than stated age S1-S2 probable holosystolic murmur Abdomen soft no rebound no guarding chest clear no added sound ROM intact   Data Reviewed: personally reviewed   CBC    Component Value Date/Time   WBC 19.0 (H) 12/08/2020 0413   RBC 3.63 (L) 12/08/2020 0413   HGB 11.5 (L) 12/08/2020 0413   HCT 36.1 12/08/2020 0413   PLT 173 12/08/2020 0413   MCV 99.4 12/08/2020 0413   MCH 31.7 12/08/2020 0413   MCHC 31.9 12/08/2020 0413   RDW 13.8 12/08/2020 0413   LYMPHSABS 0.2 (L) 12/06/2020 1551   MONOABS 0.4 12/06/2020 1551   EOSABS 0.0 12/06/2020 1551   BASOSABS 0.1 12/06/2020 1551   CMP Latest Ref Rng & Units 12/08/2020 12/07/2020 12/06/2020  Glucose 70 - 99 mg/dL 86 12/08/2020) 277(A)  BUN 8 - 23 mg/dL 128(N) 12 12  Creatinine 0.44 - 1.00 mg/dL 86(V 6.72 0.94  Sodium 135 - 145 mmol/L 137 137 136  Potassium 3.5 - 5.1 mmol/L 4.1 4.2 4.3  Chloride 98 -  111 mmol/L 103 102 99  CO2 22 - 32 mmol/L 26 22 28   Calcium 8.9 - 10.3 mg/dL 8.1(L) 8.1(L) 8.9  Total Protein 6.5 - 8.1 g/dL 5.0(L) 5.8(L) 7.5  Total Bilirubin 0.3 - 1.2 mg/dL 2.2(H) 4.9(H) 3.2(H)  Alkaline Phos 38 - 126 U/L 111 137(H) 187(H)  AST 15 - 41 U/L 143(H) 392(H) 786(H)  ALT 0 - 44 U/L 235(H) 356(H) 462(H)     Radiology Studies: DG Chest 1 View  Result Date: 12/06/2020 CLINICAL DATA:  Chest and abdominal pain. EXAM: CHEST  1 VIEW COMPARISON:  Remote radiograph 08/28/2006 FINDINGS: The heart is enlarged. Hilar prominence likely related to vascular structures, and similar to remote exam. There is aortic atherosclerosis. No pulmonary edema,  focal airspace disease, or pneumothorax. There may be trace pleural effusions. No acute osseous abnormalities are seen. IMPRESSION: Cardiomegaly with possible trace pleural effusions. Electronically Signed   By: 10/28/2006 M.D.   On: 12/06/2020 18:45   CT ABDOMEN PELVIS W CONTRAST  Result Date: 12/06/2020 CLINICAL DATA:  Abdomen pain fever epigastric pain EXAM: CT ABDOMEN AND PELVIS WITH CONTRAST TECHNIQUE: Multidetector CT imaging of the abdomen and pelvis was performed using the standard protocol following bolus administration of intravenous contrast. CONTRAST:  60mL OMNIPAQUE IOHEXOL 350 MG/ML SOLN COMPARISON:  ERCP report 02/28/2002 FINDINGS: Lower chest: Lung bases demonstrate no acute consolidation or effusion. Mild scarring at the bases. Mild cardiomegaly. Hepatobiliary: No focal hepatic abnormality. Gallbladder not clearly identified and is presumed surgically absent. Pneumobilia. Severe intra and extrahepatic biliary dilatation. Possible stone, debris, or lesion in the distal common bile duct, series 2, image 38. Pancreas: No inflammatory changes. Diffuse dilatation of the pancreatic duct. Pancreas is atrophic. Spleen: Normal in size without focal abnormality. Adrenals/Urinary Tract: Adrenal glands are normal. Kidneys show no hydronephrosis. Small stone in the right kidney. The right kidney is low lying, near the upper pelvis. The bladder is distended Stomach/Bowel: The stomach is nonenlarged. No dilated small bowel. No acute bowel wall thickening. Vascular/Lymphatic: Advanced aortic atherosclerosis. No aneurysm. No suspicious nodes. Reproductive: Status post hysterectomy.  No adnexal mass Other: No pelvic effusion or free air. Postsurgical changes of the anterior abdominal wall. Musculoskeletal: No acute or suspicious osseous abnormality. 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 IMPRESSION: 1. Status post cholecystectomy. Severe intra and extrahepatic biliary dilatation with dilatation of the pancreatic  duct concerning for obstructive process, no obvious pancreas mass lesion seen. Possible increased density within the distal common bile duct which could be due to stones, debris, or other ductal lesion, consider correlation with MRCP. Pneumobilia is present, presumably due to prior sphincterotomy. Correlate with surgical history. 2. Nonobstructing right kidney stone. Low lying right kidney in the upper pelvis. Electronically Signed   By: 13/10/2001 M.D.   On: 12/06/2020 20:04   DG ERCP BILIARY & PANCREATIC DUCTS  Result Date: 12/07/2020 CLINICAL DATA:  ERCP EXAM: ERCP COMPARISON:  CT Abdomen Pelvis, 12/06/2020. FLUOROSCOPY TIME:  Fluoroscopy Time:  1 minutes, 50 seconds Radiation Exposure Index (if provided by the fluoroscopic device): Not reported. Number of Acquired Spot Images: 5 images submitted for review. FINDINGS: Multiple, limited oblique views of the right upper quadrant demonstrating endoscopy, common bile duct cannulation, sphincterotomy, and 2x plastic biliary stent placement. IMPRESSION: Fluoroscopic imaging for ERCP and biliary stent placement. Please see dictated report from performing service for complete description of the procedure. Electronically Signed   By: 12/08/2020 M.D.   On: 12/07/2020 14:27     Scheduled Meds:  enoxaparin (  LOVENOX) injection  30 mg Subcutaneous Q24H   indomethacin  100 mg Rectal Once   Continuous Infusions:  sodium chloride Stopped (12/07/20 0106)   sodium chloride 250 mL (12/07/20 1625)   ceFEPime (MAXIPIME) IV     metronidazole       LOS: 2 days   Time spent: 64  Rhetta Mura, MD [facility] To contact the attending provider between 7A-7P or the covering provider during after hours 7P-7A, please log into the web site [email or web address] and access using universal [place] password for that web site. If you do not have the password, please call the hospital operator.  12/08/2020, 2:55 PM

## 2020-12-08 NOTE — Progress Notes (Addendum)
[facility] Progress Note  CC:  Elevated LFTs, upper abdominal pain     Subjective:  She is sitting up in the chair. She denies having any chest pain or abdominal pain. No N/V. No SOB. She is tolerating a clear liquid diet. No BM today. Her daughter is at the bedside.   Objective:   S/P ERCP 12/07/2020: - Massive CBD dilatation with choledocholithiasis. - Ascending cholangitis s/p Endo biliary stenting x 2. - S/P previous cholecystectomy.  Vital signs in last 24 hours: Temp:  [97.6 F (36.4 C)-98.3 F (36.8 C)] 98.3 F (36.8 C) (08/17 0453) Pulse Rate:  [61-87] 87 (08/17 0453) Resp:  [11-24] 18 (08/17 0453) BP: (101-131)/(52-69) 124/69 (08/17 0453) SpO2:  [97 %-100 %] 98 % (08/17 0453) Last BM Date: 12/08/20  General:   Alert 87 year old female HOH in NAD.  Eyes: No scleral icterus.  Heart: RRR, no murmur.  Pulm:  Breath sounds clear throughout.  Abdomen: Soft, nondistended. Nontender. + BS x 4 quads. No HSM. 2 vertical midline abdominal scars intact.  Extremities:  Without edema. Neurologic:  Alert and  oriented x4;  grossly normal neurologically. Psych:  Alert and cooperative. Normal mood and affect.  Intake/Output from previous day: 08/16 0701 - 08/17 0700 In: 911.3 [P.O.:250; I.V.:523; IV Piggyback:138.3] Out: 100 [Urine:100] Intake/Output this shift: No intake/output data recorded.  Lab Results: Recent Labs    12/06/20 1551 12/07/20 0350 12/08/20 0413  WBC 14.3* 30.9* 19.0*  HGB 14.5 12.6 11.5*  HCT 45.8 37.3 36.1  PLT 183 146* 173   BMET Recent Labs    12/06/20 1551 12/07/20 0350 12/08/20 0413  NA 136 137 137  K 4.3 4.2 4.1  CL 99 102 103  CO2 '28 22 26  ' GLUCOSE 123* 166* 86  BUN 12 12 24*  CREATININE 0.53 0.44 0.81  CALCIUM 8.9 8.1* 8.1*   LFT Recent Labs    12/08/20 0413  PROT 5.0*  ALBUMIN 2.8*  AST 143*  ALT 235*  ALKPHOS 111  BILITOT 2.2*  BILIDIR 1.3*   PT/INR No results for input(s): LABPROT, INR in the  last 72 hours. Hepatitis Panel No results for input(s): HEPBSAG, HCVAB, HEPAIGM, HEPBIGM in the last 72 hours.  DG Chest 1 View  Result Date: 12/06/2020 CLINICAL DATA:  Chest and abdominal pain. EXAM: CHEST  1 VIEW COMPARISON:  Remote radiograph 08/28/2006 FINDINGS: The heart is enlarged. Hilar prominence likely related to vascular structures, and similar to remote exam. There is aortic atherosclerosis. No pulmonary edema, focal airspace disease, or pneumothorax. There may be trace pleural effusions. No acute osseous abnormalities are seen. IMPRESSION: Cardiomegaly with possible trace pleural effusions. Electronically Signed   By: Keith Rake M.D.   On: 12/06/2020 18:45   CT ABDOMEN PELVIS W CONTRAST  Result Date: 12/06/2020 CLINICAL DATA:  Abdomen pain fever epigastric pain EXAM: CT ABDOMEN AND PELVIS WITH CONTRAST TECHNIQUE: Multidetector CT imaging of the abdomen and pelvis was performed using the standard protocol following bolus administration of intravenous contrast. CONTRAST:  39m OMNIPAQUE IOHEXOL 350 MG/ML SOLN COMPARISON:  ERCP report 02/28/2002 FINDINGS: Lower chest: Lung bases demonstrate no acute consolidation or effusion. Mild scarring at the bases. Mild cardiomegaly. Hepatobiliary: No focal hepatic abnormality. Gallbladder not clearly identified and is presumed surgically absent. Pneumobilia. Severe intra and extrahepatic biliary dilatation. Possible stone, debris, or lesion in the distal common bile duct, series 2, image 38. Pancreas: No inflammatory changes. Diffuse dilatation of the pancreatic duct. Pancreas is atrophic. Spleen:  Normal in size without focal abnormality. Adrenals/Urinary Tract: Adrenal glands are normal. Kidneys show no hydronephrosis. Small stone in the right kidney. The right kidney is low lying, near the upper pelvis. The bladder is distended Stomach/Bowel: The stomach is nonenlarged. No dilated small bowel. No acute bowel wall thickening. Vascular/Lymphatic:  Advanced aortic atherosclerosis. No aneurysm. No suspicious nodes. Reproductive: Status post hysterectomy.  No adnexal mass Other: No pelvic effusion or free air. Postsurgical changes of the anterior abdominal wall. Musculoskeletal: No acute or suspicious osseous abnormality. 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 IMPRESSION: 1. Status post cholecystectomy. Severe intra and extrahepatic biliary dilatation with dilatation of the pancreatic duct concerning for obstructive process, no obvious pancreas mass lesion seen. Possible increased density within the distal common bile duct which could be due to stones, debris, or other ductal lesion, consider correlation with MRCP. Pneumobilia is present, presumably due to prior sphincterotomy. Correlate with surgical history. 2. Nonobstructing right kidney stone. Low lying right kidney in the upper pelvis. Electronically Signed   By: Donavan Foil M.D.   On: 12/06/2020 20:04   DG ERCP BILIARY & PANCREATIC DUCTS  Result Date: 12/07/2020 CLINICAL DATA:  ERCP EXAM: ERCP COMPARISON:  CT Abdomen Pelvis, 12/06/2020. FLUOROSCOPY TIME:  Fluoroscopy Time:  1 minutes, 50 seconds Radiation Exposure Index (if provided by the fluoroscopic device): Not reported. Number of Acquired Spot Images: 5 images submitted for review. FINDINGS: Multiple, limited oblique views of the right upper quadrant demonstrating endoscopy, common bile duct cannulation, sphincterotomy, and 2x plastic biliary stent placement. IMPRESSION: Fluoroscopic imaging for ERCP and biliary stent placement. Please see dictated report from performing service for complete description of the procedure. Electronically Signed   By: Michaelle Birks M.D.   On: 12/07/2020 14:27    Assessment / Plan:  78) 87 year old female with acute upper abdominal pain and elevated LFTs consistent with acute cholestasis.  CTAP identified severe intra and extrahepatic biliary ductal dilatation, pancreatic duct dilatation concerning for obstructive  process without a discrete pancreatic mass.  Possible increased density within the common bile duct stone which could not all be due to choledocholithiasis or other ductal lesion. Alk phos 187 -> 137. T. Bili 3.2 -> 4.9. AST 786 -> 356. ALT 462 -> 356. Leukocytosis 14.3 -> 30.9 consistent with ascending cholangitis. S/P ERCP 12/07/2020 with Dr. Lyndel Safe showed evidence of previous limited sphincterotomy, the major papilla was edematous, massive dilatation of CBD (3 cm), 2 large filling defects (2 cm, 1.5 cm) in the mid CBD c/w choledocholithiasis, significant pus was extruded from the CBD c/w ascending cholangitis. Due to presence of large filling defects and pus c/w ascending cholangitis, stone removal/lithotripsy was deferred and 2 biliary stents were placed. A 10 Fr 7 cm plastic stent was placed into the CBD to anchor the stent and ensure good drainage, another 10 Fr 5 cm stent was placed alongside the previous stent. Today Lipase 26. LFTs drifting downward. Alk phos 111. T. Bili 2.2. AST 143. ALT 235.  WBC 30.9 -> 19. No N/V or abdominal pain. Hemodynamically stable. Afebrile.  -Heart healthy diet -Eventual MRCP as an outpatient followed by repeat ERCP (+/- EUS) in 6-8 weeks with Dr. Rush Landmark with attempt at lithotripsy. -Repeat CBC and hepatic panel in am -Defer discharge recommendations to Dr.Antonino Nienhuis    2) History of IDA on oral iron. Hg 12.6. No overt GI bleeding    Principal Problem:   Biliary obstruction Active Problems:   Iron deficiency anemia   Pressure injury of skin  LOS: 2 days   Noralyn Pick  12/08/2020, 10:19 AM   Attending physician's note   I have taken an interval history, reviewed the chart and examined the patient. I agree with the Advanced Practitioner's note, impression and recommendations.   Doing much better.  No abdominal pain.  I have reached out to Dr. Rush Landmark. Plan is to get MRCP in 4 to 6 weeks Then she would FU in his clinic for ERCP with stent  removal/EHL in 8 weeks or so.  Continue A/Bs x 10 days (total)  D/W daughter. Will sign off   Carmell Austria, MD [facility] [phone]

## 2020-12-08 NOTE — Telephone Encounter (Signed)
-----   Message from Lemar Lofty., MD sent at 12/08/2020  1:38 PM EDT ----- Regarding: RE: need advice JP, No worries. Venna Berberich, please move forward with a MRI/MRCP in 4-6 weeks for biliary obstruction, choledocholithiasis, cholangitis.. Clinic visit after the MRI/MRCP is completed within a few days with patient/family to discuss ERCP repeat.Marland Kitchen ERCP to be placed in the books for 8-weeks from now with EHL available. Thanks to all. GM ----- Message ----- From: Hilarie Fredrickson, MD Sent: 12/08/2020  12:53 PM EDT To: Lemar Lofty., MD, Lynann Bologna, MD Subject: RE: need advice                                Alison Carpenter, I appreciate the offer but it would be best if you see her, and coordinate care, as you will be offering advanced treatment(s). This would be the most effective means of direct communication with patient and family.  I haven't seen her in 16 years... Thanks, JP  ----- Message ----- From: Lemar Lofty., MD Sent: 12/08/2020   4:21 AM EDT To: Hilarie Fredrickson, MD, Lynann Bologna, MD Subject: RE: need advice                                RG, Thanks for reaching out. Hopefully LFTs improve as expected well with the significant pus/cholangitis you found. Probably needs completion sphincterotomy and DASE sphincteroplasty with attempt at stone extraction but as large as though stones are that you are describing, I am not sure that 1 ERCP with EHL is going to be enough for her and certainly at her age every time she goes to sleep this will be risky. I will be happy to talk with her and her family. I would like the biliary tree decompressed for at least 4 to 6 weeks with the 2 stents in place. I would go ahead and have an MRI/MRCP performed before the clinic visit. Even if there is a pancreatic lesion, not sure at her age there is a role for me putting her through further diagnosis via EUS. FYI JP. If you would like to see her in clinic since you know her from years ago let  me know but otherwise reply back and we can work on having Alison Carpenter arrange a follow-up. Thanks. GM ----- Message ----- From: Lynann Bologna, MD Sent: 12/07/2020   8:26 PM EDT To: Lemar Lofty., MD Subject: need advice                                    Hi Alison Carpenter,  Will need your help with possible lithotripsy of large CBD stones in this very pleasant 87 yr old (looks much younger) with ascending cholangitis. I put in 2 plastic stents.  Can you see her in your clinic in 4-6 weeks.  ?needs EUS or MRCP before.  Any thoughts?   RG

## 2020-12-09 ENCOUNTER — Other Ambulatory Visit: Payer: Self-pay

## 2020-12-09 DIAGNOSIS — K8309 Other cholangitis: Secondary | ICD-10-CM

## 2020-12-09 DIAGNOSIS — K8050 Calculus of bile duct without cholangitis or cholecystitis without obstruction: Secondary | ICD-10-CM

## 2020-12-09 DIAGNOSIS — K831 Obstruction of bile duct: Secondary | ICD-10-CM

## 2020-12-09 LAB — COMPREHENSIVE METABOLIC PANEL
ALT: 160 U/L — ABNORMAL HIGH (ref 0–44)
AST: 57 U/L — ABNORMAL HIGH (ref 15–41)
Albumin: 2.8 g/dL — ABNORMAL LOW (ref 3.5–5.0)
Alkaline Phosphatase: 109 U/L (ref 38–126)
Anion gap: 7 (ref 5–15)
BUN: 20 mg/dL (ref 8–23)
CO2: 27 mmol/L (ref 22–32)
Calcium: 7.9 mg/dL — ABNORMAL LOW (ref 8.9–10.3)
Chloride: 104 mmol/L (ref 98–111)
Creatinine, Ser: 0.64 mg/dL (ref 0.44–1.00)
GFR, Estimated: >60 mL/min (ref >60)
Glucose, Bld: 81 mg/dL (ref 70–99)
Potassium: 3.9 mmol/L (ref 3.5–5.1)
Sodium: 138 mmol/L (ref 135–145)
Total Bilirubin: 1.3 mg/dL — ABNORMAL HIGH (ref 0.3–1.2)
Total Protein: 5.2 g/dL — ABNORMAL LOW (ref 6.5–8.1)

## 2020-12-09 LAB — CBC
HCT: 36.4 % (ref 36.0–46.0)
Hemoglobin: 11.8 g/dL — ABNORMAL LOW (ref 12.0–15.0)
MCH: 32.4 pg (ref 26.0–34.0)
MCHC: 32.4 g/dL (ref 30.0–36.0)
MCV: 100 fL (ref 80.0–100.0)
Platelets: 160 10*3/uL (ref 150–400)
RBC: 3.64 MIL/uL — ABNORMAL LOW (ref 3.87–5.11)
RDW: 13.8 % (ref 11.5–15.5)
WBC: 10 10*3/uL (ref 4.0–10.5)
nRBC: 0 % (ref 0.0–0.2)

## 2020-12-09 MED ORDER — AMOXICILLIN-POT CLAVULANATE 500-125 MG PO TABS
1.0000 | ORAL_TABLET | Freq: Two times a day (BID) | ORAL | Status: DC
  Administered 2020-12-09 – 2020-12-10 (×3): 500 mg via ORAL
  Filled 2020-12-09 (×3): qty 1

## 2020-12-09 MED ORDER — OXYCODONE-ACETAMINOPHEN 5-325 MG PO TABS: 1.0000 | ORAL_TABLET | Freq: Four times a day (QID) | ORAL | Status: DC | PRN

## 2020-12-09 MED ORDER — PROCHLORPERAZINE EDISYLATE 10 MG/2ML IJ SOLN
10.0000 mg | INTRAMUSCULAR | Status: DC | PRN
  Filled 2020-12-09: qty 2

## 2020-12-09 MED ORDER — AMOXICILLIN-POT CLAVULANATE 875-125 MG PO TABS: 1.0000 | ORAL_TABLET | Freq: Two times a day (BID) | ORAL | Status: DC

## 2020-12-09 MED ORDER — PROCHLORPERAZINE MALEATE 5 MG PO TABS: 5.0000 mg | ORAL_TABLET | Freq: Four times a day (QID) | ORAL | Status: DC | PRN

## 2020-12-09 NOTE — Progress Notes (Signed)
   12/09/20 0900  Mobility  Activity Ambulated in hall  Level of Assistance Contact guard assist, steadying assist  Assistive Device Front wheel walker  Distance Ambulated (ft) 35 ft  Mobility Ambulated with assistance in hallway  Mobility Response Tolerated fair  Mobility performed by Mobility specialist  $Mobility charge 1 Mobility   Pt is slightly hard of hearing. Upon entering, pt stated she got up to use the restroom prior. She did agree to ambulate a small distance. She got to EOB with little to no assistance. Upon standing she stated that she felt dizzy and weak in her legs. Took a standing break until she felt comfortable to proceed. Pt ambulated about 56ft with RW, tolerated fair. When she returned, she stated she felt lightheaded and dizzy still and would like to rest. No other complaints. Left her in bed with alarm on and call bell at side.    Timoteo Ex[employer] Mobility Specialist Acute Rehab Services Office: [phone]

## 2020-12-09 NOTE — Progress Notes (Signed)
PROGRESS NOTE   Alison Carpenter  DPO:[id number] DOB: 1917/12/17 DOA: 12/06/2020 PCP: Burton Apley, MD  Brief Narrative:  87 year old white female from home Left hip fracture Status post repair 06/16/2020, diverticulosis status post colon resection Possible chronic occult AVM GI related blood loss-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 in 2006  Developed abdominal pain 12/06/2020 with nausea Xarelto no vomit Found to have elevated lipase and LFTs-CT scan showed biliary ductal dilatation GI consulted Admitted and found to have ascending cholangitis  GI saw patient-had ERCP 8/16--GI signed off  Hospital-Problem based course  Ascending cholangitis S/p Endo biliary stenting X2 (10 [place] 7 cm, 10 [place] 5 cm additionally) cefepime/Flagyl -->Augmentin as per GI for 10 days ending 12/15/20 Chronic normocytic anemia with possibility of AVM slow blood loss Stable at this time--has declined work-up in the past AKI secondary to ATN cont IVF 125 cc/H for 18 hours improved Stage 1 decibuitus ulcer Turn as able q 2 hours  DVT prophylaxis: Lovenox Code Status: Full Family Communication: D/w detail with daughter at the bedside Joyice Faster [phone] Disposition:  Status is: Inpatient  Remains inpatient appropriate because:Hemodynamically unstable, Ongoing diagnostic testing needed not appropriate for outpatient work up, and Unsafe d/c plan  Dispo: The patient is from: Home              Anticipated d/c is to: Home              Patient currently is not medically stable to d/c.   Difficult to place patient No  Consultants:  Gastroenterology  Procedures: None  Antimicrobials: As above  Subjective:  Some n-had a little vomiting No cp fever  Overall is improved  Objective: Vitals:   12/08/20 1340 12/08/20 2048 12/09/20 0417 12/09/20 1352  BP: 114/65 119/63 (!) 144/74 139/71  Pulse: 72 72 77 72  Resp: 18 16 16 14   Temp: 98.6 F (37 C) 99.1 F (37.3 C) 98.6 F (37 C) 97.8 F (36.6 C)   TempSrc: Oral Oral Oral Oral  SpO2: 95% 95% 94% 96%  Weight:      Height:        Intake/Output Summary (Last 24 hours) at 12/09/2020 1511 Last data filed at 12/09/2020 1500 Gross per 24 hour  Intake 545.49 ml  Output 300 ml  Net 245.49 ml    Filed Weights   12/07/20 0341  Weight: 39.3 kg    Examination:  EOMI NCAT- younger than stated age S1-S2 holosystolic murmur Abdomen soft no rebound no guarding chest clear no added sound ROM intact, power intact   Data Reviewed: personally reviewed   CBC    Component Value Date/Time   WBC 10.0 12/09/2020 0526   RBC 3.64 (L) 12/09/2020 0526   HGB 11.8 (L) 12/09/2020 0526   HCT 36.4 12/09/2020 0526   PLT 160 12/09/2020 0526   MCV 100.0 12/09/2020 0526   MCH 32.4 12/09/2020 0526   MCHC 32.4 12/09/2020 0526   RDW 13.8 12/09/2020 0526   LYMPHSABS 0.2 (L) 12/06/2020 1551   MONOABS 0.4 12/06/2020 1551   EOSABS 0.0 12/06/2020 1551   BASOSABS 0.1 12/06/2020 1551   CMP Latest Ref Rng & Units 12/09/2020 12/08/2020 12/07/2020  Glucose 70 - 99 mg/dL 81 86 12/09/2020)  BUN 8 - 23 mg/dL 20 867(Y) 12  Creatinine 0.44 - 1.00 mg/dL 19(J 0.93 2.67  Sodium 135 - 145 mmol/L 138 137 137  Potassium 3.5 - 5.1 mmol/L 3.9 4.1 4.2  Chloride 98 - 111 mmol/L 104 103 102  CO2 22 -  32 mmol/L 27 26 22   Calcium 8.9 - 10.3 mg/dL 7.9(L) 8.1(L) 8.1(L)  Total Protein 6.5 - 8.1 g/dL 5.2(L) 5.0(L) 5.8(L)  Total Bilirubin 0.3 - 1.2 mg/dL ) 2.2(H) 4.9(H)  Alkaline Phos 38 - 126 U/L 109 111 137(H)  AST 15 - 41 U/L 57(H) 143(H) 392(H)  ALT 0 - 44 U/L 160(H) 235(H) 356(H)     Radiology Studies: No results found.   Scheduled Meds:  amoxicillin-clavulanate  1 tablet Oral Q12H   enoxaparin (LOVENOX) injection  30 mg Subcutaneous Q24H   indomethacin  100 mg Rectal Once   Continuous Infusions:  sodium chloride Stopped (12/07/20 0106)     LOS: 3 days   Time spent: 26  34, MD [facility] To contact the attending provider  between 7A-7P or the covering provider during after hours 7P-7A, please log into the web site [email or web address] and access using universal [place] password for that web site. If you do not have the password, please call the hospital operator.  12/09/2020, 3:11 PM

## 2020-12-09 NOTE — Progress Notes (Signed)
PHARMACY NOTE:  ANTIMICROBIAL RENAL DOSAGE ADJUSTMENT  Current antimicrobial regimen includes a mismatch between antimicrobial dosage and estimated renal function.  As per policy approved by the Pharmacy & Therapeutics and Medical Executive Committees, the antimicrobial dosage will be adjusted accordingly.  Current antimicrobial dosage:  augmentin 875mg  bid  Indication: intra-abd infection  Renal Function:  Estimated Creatinine Clearance: 22.6 mL/min (by C-G formula based on SCr of 0.64 mg/dL). []      On intermittent HD, scheduled: []      On CRRT    Antimicrobial dosage has been changed to:  Augnentin 500mg  bid    Thank you for allowing pharmacy to be a part of this patient's care.  , [facility] 12/09/2020 9:54 AM

## 2020-12-09 NOTE — Care Management Important Message (Signed)
Important Message  Patient Details Verbal per Daughter Joyice Faster Name: Alison Carpenter MRN: [id number] Date of Birth: 1917/12/07   Medicare Important Message Given:  Yes     Caren Macadam 12/09/2020, 11:28 AM

## 2020-12-09 NOTE — Telephone Encounter (Signed)
ERCP scheduled for 01/31/21 at 730 am at [facility] with GM

## 2020-12-09 NOTE — TOC Initial Note (Signed)
Transition of Care [facility]) - Initial/Assessment Note    Patient Details  Name: Alison Carpenter MRN: [id number] Date of Birth: 12/01/1917  Transition of Care [facility]) CM/SW Contact:    Lanier Clam, RN Phone Number: 12/09/2020, 12:50 PM  Clinical Narrative:  spoke to dtr Kay-used [facility] in past [facility] rep Pearson Grippe accepted for HHPT-will need HHPT order, & face to face. Family able to transport home.                 Expected Discharge Plan: Home w Home Health Services Barriers to Discharge: No Barriers Identified   Patient Goals and CMS Choice Patient states their goals for this hospitalization and ongoing recovery are:: go home CMS Medicare.gov Compare Post Acute Care list provided to:: Patient Represenative (must comment) (dtr Kay [phone]) Choice offered to / list presented to : Adult Children  Expected Discharge Plan and Services Expected Discharge Plan: Home w Home Health Services   Discharge Planning Services: CM Consult Post Acute Care Choice: Home Health Living arrangements for the past 2 months: Single Family Home                           HH Arranged: PT HH Agency: [facility] Date HH Agency Contacted: 12/09/20 Time HH Agency Contacted: [phone] Representative spoke with at [facility] Agency: Pearson Grippe  Prior Living Arrangements/Services Living arrangements for the past 2 months: Single Family Home Lives with:: Adult Children Patient language and need for interpreter reviewed:: Yes Do you feel safe going back to the place where you live?: Yes      Need for Family Participation in Patient Care: No (Comment) Care giver support system in place?: Yes (comment)   Criminal Activity/Legal Involvement Pertinent to Current Situation/Hospitalization: No - Comment as needed  Activities of Daily Living Home Assistive Devices/Equipment: Eyeglasses, Cane (specify quad or straight) (reading glasses  3 foot cane) ADL Screening (condition at time of  admission) Patient's cognitive ability adequate to safely complete daily activities?: No Is the patient deaf or have difficulty hearing?: Yes Does the patient have difficulty seeing, even when wearing glasses/contacts?: No Does the patient have difficulty concentrating, remembering, or making decisions?: No Patient able to express need for assistance with ADLs?: Yes Does the patient have difficulty dressing or bathing?: No Independently performs ADLs?: Yes (appropriate for developmental age) Does the patient have difficulty walking or climbing stairs?: No Weakness of Legs: Left Weakness of Arms/Hands: None  Permission Sought/Granted Permission sought to share information with : Case Manager Permission granted to share information with : Yes, Verbal Permission Granted  Share Information with NAME: Case Manager     Permission granted to share info w Relationship: Kay dtr [phone]     Emotional Assessment Appearance:: Appears stated age Attitude/Demeanor/Rapport: Gracious Affect (typically observed): Accepting Orientation: : Oriented to Self Alcohol / Substance Use: Not Applicable Psych Involvement: No (comment)  Admission diagnosis:  Biliary obstruction [K83.1] Chest pain [R07.9] Patient Active Problem List   Diagnosis Date Noted   Pressure injury of skin 12/07/2020   Biliary obstruction 12/06/2020   Iron deficiency anemia 06/16/2020   Underweight 06/16/2020   Physical deconditioning 06/16/2020   Closed nondisplaced intertrochanteric fracture of left femur (HCC)    PCP:  Burton Apley, MD Pharmacy:   [facility] [phone] - [place], [place] - [address] AT [address] OF [address] [place] [address] Phone: [phone] Fax: [phone]     Social  Determinants of Health (SDOH) Interventions    Readmission Risk Interventions No flowsheet data found.

## 2020-12-10 LAB — COMPREHENSIVE METABOLIC PANEL
ALT: 119 U/L — ABNORMAL HIGH (ref 0–44)
AST: 29 U/L (ref 15–41)
Albumin: 2.8 g/dL — ABNORMAL LOW (ref 3.5–5.0)
Alkaline Phosphatase: 107 U/L (ref 38–126)
Anion gap: 9 (ref 5–15)
BUN: 14 mg/dL (ref 8–23)
CO2: 29 mmol/L (ref 22–32)
Calcium: 8.1 mg/dL — ABNORMAL LOW (ref 8.9–10.3)
Chloride: 101 mmol/L (ref 98–111)
Creatinine, Ser: 0.52 mg/dL (ref 0.44–1.00)
GFR, Estimated: >60 mL/min (ref >60)
Glucose, Bld: 82 mg/dL (ref 70–99)
Potassium: 3.1 mmol/L — ABNORMAL LOW (ref 3.5–5.1)
Sodium: 139 mmol/L (ref 135–145)
Total Bilirubin: 1.3 mg/dL — ABNORMAL HIGH (ref 0.3–1.2)
Total Protein: 5.3 g/dL — ABNORMAL LOW (ref 6.5–8.1)

## 2020-12-10 LAB — LIPASE, BLOOD: Lipase: 27 U/L (ref 11–51)

## 2020-12-10 LAB — MAGNESIUM: Magnesium: 2 mg/dL (ref 1.7–2.4)

## 2020-12-10 MED ORDER — POTASSIUM CHLORIDE CRYS ER 20 MEQ PO TBCR
40.0000 meq | EXTENDED_RELEASE_TABLET | Freq: Every day | ORAL | Status: DC
  Administered 2020-12-10: 40 meq via ORAL
  Filled 2020-12-10: qty 2

## 2020-12-10 MED ORDER — POTASSIUM CHLORIDE CRYS ER 20 MEQ PO TBCR: 40.0000 meq | EXTENDED_RELEASE_TABLET | Freq: Every day | ORAL | 0 refills | Status: DC

## 2020-12-10 MED ORDER — AMOXICILLIN-POT CLAVULANATE 500-125 MG PO TABS: 1.0000 | ORAL_TABLET | Freq: Two times a day (BID) | ORAL | 0 refills | Status: AC

## 2020-12-10 NOTE — Telephone Encounter (Signed)
Received a call from the pt daughter Joyce Gross to return call to her at [phone].  Call placed and voice mail left.

## 2020-12-10 NOTE — TOC Transition Note (Signed)
Transition of Care [facility]) - CM/SW Discharge Note   Patient Details  Name: Alison Carpenter MRN: [id number] Date of Birth: 1918/02/07  Transition of Care [facility]) CM/SW Contact:  Lanier Clam, RN Phone Number: 12/10/2020, 10:51 AM   Clinical Narrative: d/c home w/[facility] rep Pearson Grippe aware of HHPT/OT/aide. No further CM needs.      Final next level of care: Home w Home Health Services Barriers to Discharge: No Barriers Identified   Patient Goals and CMS Choice Patient states their goals for this hospitalization and ongoing recovery are:: go home CMS Medicare.gov Compare Post Acute Care list provided to:: Patient Represenative (must comment) (dtr Kay [phone]) Choice offered to / list presented to : Adult Children  Discharge Placement                       Discharge Plan and Services   Discharge Planning Services: CM Consult Post Acute Care Choice: Home Health                    HH Arranged: PT HH Agency: [facility] Date HH Agency Contacted: 12/09/20 Time HH Agency Contacted: [phone] Representative spoke with at [facility] Agency: Pearson Grippe  Social Determinants of Health (SDOH) Interventions     Readmission Risk Interventions No flowsheet data found.

## 2020-12-10 NOTE — Progress Notes (Signed)
Discharge instructions given to patient and all questions were answered.  

## 2020-12-10 NOTE — Telephone Encounter (Signed)
The pts daughter returned call.  She has been given the appt for follow up with Dr Meridee Score.  She is also aware she will get a call from the schedulers to set up the MRI MRCP. She is aware of the ERCP date and time as well.  Instructions entered and will be provided to the pt at her upcoming office visit.

## 2020-12-10 NOTE — Telephone Encounter (Signed)
Left message on machine to call back  

## 2020-12-10 NOTE — Discharge Summary (Signed)
Physician Discharge Summary  JENAYE RICKERT VPX:[id number] DOB: Aug 18, 1917 DOA: 12/06/2020  PCP: Burton Apley, MD  Admit date: 12/06/2020 Discharge date: 12/10/2020  Time spent: 25 minutes  Recommendations for Outpatient Follow-up:  Needs Augmentin to complete 10-day course as per [facility] Outpatient follow-up with Dr. Chales Abrahams GI Obtain Chem-12 CBC in about 1 week given potassium was low this admission  Discharge Diagnoses:  MAIN problem for hospitalization   Ascending cholangitis with choledocholithiasis status post ERCP  Please see below for itemized issues addressed in HOpsital- refer to other progress notes for clarity if needed  Discharge Condition: Improved  Diet recommendation: Heart healthy  Filed Weights   12/07/20 0341  Weight: 39.3 kg    History of present illness:  87 year old white female from home Left hip fracture Status post repair 06/16/2020, diverticulosis status post colon resection Possible chronic occult AVM GI related blood loss-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 in 2006  Developed abdominal pain 12/06/2020 with nausea Xarelto no vomit Found to have elevated lipase and LFTs-CT scan showed biliary ductal dilatation GI consulted Admitted and found to have ascending cholangitis   GI saw patient-had ERCP 8/16--GI signed off  Hospital Course:  Ascending cholangitis S/p Endo biliary stenting X2 (10 French 7 cm, 10 French 5 cm additionally) cefepime/Flagyl -->Augmentin as per GI for 10 days ending 12/15/20 Patient stabilized although had intermittent nausea-she should follow-up in the outpatient setting with GI She will need Chem-12 in about 1 week Plastic stents may need to be reconsidered in the outpatient setting Chronic normocytic anemia with possibility of AVM slow blood loss Stable at this time--has declined work-up in the past AKI secondary to ATN Hypokalemia Was on IV fluid until 8/18 and AKI resolved i she has mild hypokalemia in addition on discharge which  we will replace orally in the outpatient setting Stage 1 decibuitus ulcer Turn as able q 2 hours   Consultations: Gastroenterology Dr. Chales Abrahams  Procedure ERCP dated 8/16 with placement of 2 plastic stents  Discharge Exam: Vitals:   12/09/20 2032 12/10/20 0635  BP: 138/76 (!) 161/81  Pulse: 69 81  Resp: 18 17  Temp: 98.8 F (37.1 C) 98.2 F (36.8 C)  SpO2: 95% 95%    Subj on day of d/c   Doing fair tolerated diet per her own report Nursing tells me he had some nausea yesterday however Otherwise looks much better feels better No chest pain no fever  General Exam on discharge  Alert coherent no distress EOMI NCAT no icterus no pallor CTA B no added sound no rales rhonchi Abdomen soft no rebound no guarding No lower extremity edema ROM intact Neurologically intact no focal deficit  Discharge Instructions   Discharge Instructions     Call MD for:  difficulty breathing, headache or visual disturbances   Complete by: As directed    Call MD for:  redness, tenderness, or signs of infection (pain, swelling, redness, odor or green/yellow discharge around incision site)   Complete by: As directed    Call MD for:  temperature >100.4   Complete by: As directed    Diet - low sodium heart healthy   Complete by: As directed    Discharge instructions   Complete by: As directed    Increase activity slowly   Complete by: As directed    No wound care   Complete by: As directed       Allergies as of 12/10/2020       Reactions   Red Dye Anaphylaxis  Medication List     TAKE these medications    acetaminophen 500 MG tablet Commonly known as: TYLENOL Take 1,000 mg by mouth every 6 (six) hours as needed for mild pain.   amoxicillin-clavulanate 500-125 MG tablet Commonly known as: AUGMENTIN Take 1 tablet (500 mg total) by mouth every 12 (twelve) hours for 5 days.   aspirin EC 81 MG tablet Take 81 mg by mouth daily. Swallow whole.   ferrous sulfate 325 (65  FE) MG tablet Take 1 tablet (325 mg total) by mouth daily.   loratadine 10 MG tablet Commonly known as: CLARITIN Take 10 mg by mouth daily as needed for allergies.   potassium chloride SA 20 MEQ tablet Commonly known as: KLOR-CON Take 2 tablets (40 mEq total) by mouth daily. Start taking on: December 11, 2020       Allergies  Allergen Reactions   Red Dye Anaphylaxis    Follow-up Information     [facility] Follow up.   Why: HH physical therapy Contact information: [address] [place] [address] [phone]                 The results of significant diagnostics from this hospitalization (including imaging, microbiology, ancillary and laboratory) are listed below for reference.    Significant Diagnostic Studies: DG Chest 1 View  Result Date: 12/06/2020 CLINICAL DATA:  Chest and abdominal pain. EXAM: CHEST  1 VIEW COMPARISON:  Remote radiograph 08/28/2006 FINDINGS: The heart is enlarged. Hilar prominence likely related to vascular structures, and similar to remote exam. There is aortic atherosclerosis. No pulmonary edema, focal airspace disease, or pneumothorax. There may be trace pleural effusions. No acute osseous abnormalities are seen. IMPRESSION: Cardiomegaly with possible trace pleural effusions. Electronically Signed   By: Narda Rutherford M.D.   On: 12/06/2020 18:45   CT ABDOMEN PELVIS W CONTRAST  Result Date: 12/06/2020 CLINICAL DATA:  Abdomen pain fever epigastric pain EXAM: CT ABDOMEN AND PELVIS WITH CONTRAST TECHNIQUE: Multidetector CT imaging of the abdomen and pelvis was performed using the standard protocol following bolus administration of intravenous contrast. CONTRAST:  75mL OMNIPAQUE IOHEXOL 350 MG/ML SOLN COMPARISON:  ERCP report 02/28/2002 FINDINGS: Lower chest: Lung bases demonstrate no acute consolidation or effusion. Mild scarring at the bases. Mild cardiomegaly. Hepatobiliary: No focal hepatic abnormality. Gallbladder not clearly  identified and is presumed surgically absent. Pneumobilia. Severe intra and extrahepatic biliary dilatation. Possible stone, debris, or lesion in the distal common bile duct, series 2, image 38. Pancreas: No inflammatory changes. Diffuse dilatation of the pancreatic duct. Pancreas is atrophic. Spleen: Normal in size without focal abnormality. Adrenals/Urinary Tract: Adrenal glands are normal. Kidneys show no hydronephrosis. Small stone in the right kidney. The right kidney is low lying, near the upper pelvis. The bladder is distended Stomach/Bowel: The stomach is nonenlarged. No dilated small bowel. No acute bowel wall thickening. Vascular/Lymphatic: Advanced aortic atherosclerosis. No aneurysm. No suspicious nodes. Reproductive: Status post hysterectomy.  No adnexal mass Other: No pelvic effusion or free air. Postsurgical changes of the anterior abdominal wall. Musculoskeletal: No acute or suspicious osseous abnormality. 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 IMPRESSION: 1. Status post cholecystectomy. Severe intra and extrahepatic biliary dilatation with dilatation of the pancreatic duct concerning for obstructive process, no obvious pancreas mass lesion seen. Possible increased density within the distal common bile duct which could be due to stones, debris, or other ductal lesion, consider correlation with MRCP. Pneumobilia is present, presumably due to prior sphincterotomy. Correlate with surgical history.  2. Nonobstructing right kidney stone. Low lying right kidney in the upper pelvis. Electronically Signed   By: Jasmine Pang M.D.   On: 12/06/2020 20:04   DG ERCP BILIARY & PANCREATIC DUCTS  Result Date: 12/07/2020 CLINICAL DATA:  ERCP EXAM: ERCP COMPARISON:  CT Abdomen Pelvis, 12/06/2020. FLUOROSCOPY TIME:  Fluoroscopy Time:  1 minutes, 50 seconds Radiation Exposure Index (if provided by the fluoroscopic device): Not reported. Number of Acquired Spot Images: 5 images submitted for review. FINDINGS: Multiple,  limited oblique views of the right upper quadrant demonstrating endoscopy, common bile duct cannulation, sphincterotomy, and 2x plastic biliary stent placement. IMPRESSION: Fluoroscopic imaging for ERCP and biliary stent placement. Please see dictated report from performing service for complete description of the procedure. Electronically Signed   By: Roanna Banning M.D.   On: 12/07/2020 14:27    Microbiology: Recent Results (from the past 240 hour(s))  Resp Panel by RT-PCR (Flu A&B, Covid) Nasopharyngeal Swab     Status: None   Collection Time: 12/06/20  4:56 PM   Specimen: Nasopharyngeal Swab; Nasopharyngeal(NP) swabs in vial transport medium  Result Value Ref Range Status   SARS Coronavirus 2 by RT PCR NEGATIVE NEGATIVE Final    Comment: (NOTE) SARS-CoV-2 target nucleic acids are NOT DETECTED.  The SARS-CoV-2 RNA is generally detectable in upper respiratory specimens during the acute phase of infection. The lowest concentration of SARS-CoV-2 viral copies this assay can detect is 138 copies/mL. A negative result does not preclude SARS-Cov-2 infection and should not be used as the sole basis for treatment or other patient management decisions. A negative result may occur with  improper specimen collection/handling, submission of specimen other than nasopharyngeal swab, presence of viral mutation(s) within the areas targeted by this assay, and inadequate number of viral copies(<138 copies/mL). A negative result must be combined with clinical observations, patient history, and epidemiological information. The expected result is Negative.  Fact Sheet for Patients:  BloggerCourse.com  Fact Sheet for Healthcare Providers:  SeriousBroker.it  This test is no t yet approved or cleared by the [place] FDA and  has been authorized for detection and/or diagnosis of SARS-CoV-2 by FDA under an Emergency Use Authorization (EUA). This EUA will  remain  in effect (meaning this test can be used) for the duration of the COVID-19 declaration under Section 564(b)(1) of the Act, 21 U.S.C.section 360bbb-3(b)(1), unless the authorization is terminated  or revoked sooner.       Influenza A by PCR NEGATIVE NEGATIVE Final   Influenza B by PCR NEGATIVE NEGATIVE Final    Comment: (NOTE) The Xpert Xpress SARS-CoV-2/FLU/RSV plus assay is intended as an aid in the diagnosis of influenza from Nasopharyngeal swab specimens and should not be used as a sole basis for treatment. Nasal washings and aspirates are unacceptable for Xpert Xpress SARS-CoV-2/FLU/RSV testing.  Fact Sheet for Patients: BloggerCourse.com  Fact Sheet for Healthcare Providers: SeriousBroker.it  This test is not yet approved or cleared by the [place] FDA and has been authorized for detection and/or diagnosis of SARS-CoV-2 by FDA under an Emergency Use Authorization (EUA). This EUA will remain in effect (meaning this test can be used) for the duration of the COVID-19 declaration under Section 564(b)(1) of the Act, 21 U.S.C. section 360bbb-3(b)(1), unless the authorization is terminated or revoked.  Performed at [facility], [address], [place], [place] [address]      Labs: Basic Metabolic Panel: Recent Labs  Lab 12/06/20 1551 12/07/20 0350 12/08/20 0413 12/09/20 0526 12/10/20  0419 12/10/20 0756  NA 136 137 137 138 139  --   K 4.3 4.2 4.1 3.9 3.1*  --   CL 99 102 103 104 101  --   CO2 28 22 26 27 29   --   GLUCOSE 123* 166* 86 81 82  --   BUN 12 12 24* 20 14  --   CREATININE 0.53 0.44 0.81 0.64 0.52  --   CALCIUM 8.9 8.1* 8.1* 7.9* 8.1*  --   MG  --   --   --   --   --  2.0   Liver Function Tests: Recent Labs  Lab 12/06/20 1551 12/07/20 0350 12/08/20 0413 12/09/20 0526 12/10/20 0419  AST 786* 392* 143* 57* 29  ALT 462* 356* 235* 160* 119*  ALKPHOS 187* 137* 111 109  107  BILITOT 3.2* 4.9* 2.2* 1.3* 1.3*  PROT 7.5 5.8* 5.0* 5.2* 5.3*  ALBUMIN 4.2 3.1* 2.8* 2.8* 2.8*   Recent Labs  Lab 12/06/20 1551 12/08/20 0413 12/10/20 0419  LIPASE 101* 26 27   No results for input(s): AMMONIA in the last 168 hours. CBC: Recent Labs  Lab 12/06/20 1551 12/07/20 0350 12/08/20 0413 12/09/20 0526  WBC 14.3* 30.9* 19.0* 10.0  NEUTROABS 13.6*  --   --   --   HGB 14.5 12.6 11.5* 11.8*  HCT 45.8 37.3 36.1 36.4  MCV 99.6 95.6 99.4 100.0  PLT 183 146* 173 160   Cardiac Enzymes: No results for input(s): CKTOTAL, CKMB, CKMBINDEX, TROPONINI in the last 168 hours. BNP: BNP (last 3 results) No results for input(s): BNP in the last 8760 hours.  ProBNP (last 3 results) No results for input(s): PROBNP in the last 8760 hours.  CBG: No results for input(s): GLUCAP in the last 168 hours.     Signed:  Rhetta Mura MD   [facility] 12/10/2020, 9:12 AM

## 2021-01-20 ENCOUNTER — Encounter (HOSPITAL_COMMUNITY): Payer: Self-pay | Admitting: Gastroenterology

## 2021-01-24 ENCOUNTER — Ambulatory Visit (HOSPITAL_COMMUNITY)
Admission: RE | Admit: 2021-01-24 | Discharge: 2021-01-24 | Disposition: A | Discharge status: Home / Self Care | Payer: Medicare Other | Source: Ambulatory Visit | Attending: Gastroenterology | Admitting: Gastroenterology

## 2021-01-24 ENCOUNTER — Other Ambulatory Visit: Payer: Self-pay | Admitting: Gastroenterology

## 2021-01-24 ENCOUNTER — Other Ambulatory Visit: Payer: Self-pay

## 2021-01-24 DIAGNOSIS — K831 Obstruction of bile duct: Secondary | ICD-10-CM

## 2021-01-24 DIAGNOSIS — K8050 Calculus of bile duct without cholangitis or cholecystitis without obstruction: Secondary | ICD-10-CM | POA: Insufficient documentation

## 2021-01-24 DIAGNOSIS — K8020 Calculus of gallbladder without cholecystitis without obstruction: Secondary | ICD-10-CM | POA: Insufficient documentation

## 2021-01-24 MED ORDER — GADOBUTROL 1 MMOL/ML IV SOLN
5.0000 mL | Freq: Once | INTRAVENOUS | Status: AC | PRN
  Administered 2021-01-24: 5 mL via INTRAVENOUS

## 2021-01-27 ENCOUNTER — Ambulatory Visit (INDEPENDENT_AMBULATORY_CARE_PROVIDER_SITE_OTHER): Payer: Medicare Other | Admitting: Gastroenterology

## 2021-01-27 ENCOUNTER — Encounter: Payer: Self-pay | Admitting: Gastroenterology

## 2021-01-27 ENCOUNTER — Other Ambulatory Visit (INDEPENDENT_AMBULATORY_CARE_PROVIDER_SITE_OTHER): Payer: Medicare Other

## 2021-01-27 VITALS — BP 100/62 | HR 82 | Ht 61.0 in | Wt 84.8 lb

## 2021-01-27 DIAGNOSIS — K8051 Calculus of bile duct without cholangitis or cholecystitis with obstruction: Secondary | ICD-10-CM

## 2021-01-27 DIAGNOSIS — R7889 Finding of other specified substances, not normally found in blood: Secondary | ICD-10-CM

## 2021-01-27 DIAGNOSIS — K8050 Calculus of bile duct without cholangitis or cholecystitis without obstruction: Secondary | ICD-10-CM

## 2021-01-27 DIAGNOSIS — Z9889 Other specified postprocedural states: Secondary | ICD-10-CM

## 2021-01-27 DIAGNOSIS — R112 Nausea with vomiting, unspecified: Secondary | ICD-10-CM

## 2021-01-27 DIAGNOSIS — K8309 Other cholangitis: Secondary | ICD-10-CM | POA: Diagnosis not present

## 2021-01-27 DIAGNOSIS — K831 Obstruction of bile duct: Secondary | ICD-10-CM

## 2021-01-27 DIAGNOSIS — R7989 Other specified abnormal findings of blood chemistry: Secondary | ICD-10-CM

## 2021-01-27 LAB — COMPREHENSIVE METABOLIC PANEL
ALT: 9 U/L (ref 0–35)
AST: 12 U/L (ref 0–37)
Albumin: 3.8 g/dL (ref 3.5–5.2)
Alkaline Phosphatase: 58 U/L (ref 39–117)
BUN: 14 mg/dL (ref 6–23)
CO2: 31 mEq/L (ref 19–32)
Calcium: 9.1 mg/dL (ref 8.4–10.5)
Chloride: 102 mEq/L (ref 96–112)
Creatinine, Ser: 0.63 mg/dL (ref 0.40–1.20)
GFR: 72.3 mL/min (ref >60.00)
Glucose, Bld: 122 mg/dL — ABNORMAL HIGH (ref 70–99)
Potassium: 3.4 mEq/L — ABNORMAL LOW (ref 3.5–5.1)
Sodium: 139 mEq/L (ref 135–145)
Total Bilirubin: 0.8 mg/dL (ref 0.2–1.2)
Total Protein: 6.4 g/dL (ref 6.0–8.3)

## 2021-01-27 LAB — CBC
HCT: 39.1 % (ref 36.0–46.0)
Hemoglobin: 12.8 g/dL (ref 12.0–15.0)
MCHC: 32.6 g/dL (ref 30.0–36.0)
MCV: 96 fl (ref 78.0–100.0)
Platelets: 214 10*3/uL (ref 150.0–400.0)
RBC: 4.07 Mil/uL (ref 3.87–5.11)
RDW: 14.7 % (ref 11.5–15.5)
WBC: 6.7 10*3/uL (ref 4.0–10.5)

## 2021-01-27 LAB — PROTIME-INR
INR: 1.1 ratio — ABNORMAL HIGH (ref 0.8–1.0)
Prothrombin Time: 11.8 s (ref 9.6–13.1)

## 2021-01-27 MED ORDER — PROMETHAZINE HCL 12.5 MG PO TABS: 12.5000 mg | ORAL_TABLET | Freq: Three times a day (TID) | ORAL | 0 refills | Status: DC | PRN

## 2021-01-27 NOTE — Progress Notes (Signed)
[place] VISIT   Primary Care Provider Lorene Dy, [place], [place] [address] [phone]  Referring Provider Dr. Lyndel Safe and Dr. Henrene Pastor  Patient Profile: Alison Carpenter is a 87 y.o. female with a pmh significant for cataracts, allergies, status post hysterectomy, status post partial colectomy for diverticular disease, cecal AVMs, status post cholecystectomy with recurrent choledocholithiasis now status post ERCPs with biliary stents.  The patient presents to the [facility] for an evaluation and management of problem(s) noted below:  Problem List 1. Choledocholithiasis   2. History of biliary duct stent placement   3. Ascending cholangitis   4. Biliary obstruction   5. History of ERCP   6. Abnormal LFTs   7. Postoperative nausea and vomiting     History of Present Illness This is a patient known to Dr. Henrene Pastor and Dr. Carlean Purl and Dr. Lyndel Safe.  The patient was admitted in August with abdominal pain, abnormal LFTs and found to have imaging concerning for biliary obstruction secondary to choledocholithiasis.  She underwent ERCP with significant findings of cholangitis as well as a significantly dilated biliary tree for which biliary stents were placed.  Antibiotics were continued and eventually she was able to be discharged.  Due to the complexity of the stones that were noted and the size of her bile duct as well as her overt cholangitis at her age, it was felt that stone extraction was not the safest plan at the time and that decompression and subsequent repeat ERCP would be a better means of treatment.  It is for this reason the patient is being evaluated by myself today for the need of potential EHL and DASE for removal of these large stones.  Patient underwent a recent MRI/MRCP which showed no evidence of a pancreatic mass and the previously noted choledocholithiasis was not as well defined imaging was concerning for  potential stone movement towards her proximal bile duct but was not completely defined on the MRI/MRCP.  The patient has not had any labs since her hospitalization.  Overall she has been doing well.  The previous abdominal pain is not present.  She still has some fatigue.  Patient's daughter states she is hopeful that the patient will not need to have repeat procedures as she has always had issues with nausea and vomiting after anesthesia.  The patient has previously had an ERCP years ago which had suggested previously passed biliary stones.  The most recent ERCP suggested a sphincterotomy was noted.  The patient does not take significant nonsteroidals or BC/Goody powders.  She has not noted significant darkening of her urine.  GI Review of Systems Positive as above Negative for pyrosis, dysphagia, odynophagia, nausea, vomiting, pain, alteration of bowel habits, melena, hematochezia  Review of Systems General: Denies fevers/chills/weight loss unintentionally Cardiovascular: Denies chest pain/palpitations Pulmonary: Denies shortness of breath Gastroenterological: See HPI Genitourinary: Denies darkened urine or hematuria Hematological: Denies easy bruising/bleeding Dermatological: Denies jaundice Psychological: Mood is stable   Medications Current Outpatient Medications  Medication Sig Dispense Refill   promethazine (PHENERGAN) 12.5 MG tablet Take 1 tablet (12.5 mg total) by mouth every 8 (eight) hours as needed for nausea or vomiting. 15 tablet 0   acetaminophen (TYLENOL) 500 MG tablet Take 1,000 mg by mouth every 6 (six) hours as needed for mild pain.     loratadine (CLARITIN) 10 MG tablet Take 10 mg by mouth daily as needed for allergies.     No current facility-administered medications for this visit.  Allergies Allergies  Allergen Reactions   Red Dye Anaphylaxis    Histories Past Medical History:  Diagnosis Date   Complication of anesthesia    PONV   Past Surgical History:   Procedure Laterality Date   ABDOMINAL HYSTERECTOMY     APPENDECTOMY     BILIARY STENT PLACEMENT N/A 12/07/2020   Procedure: BILIARY STENT PLACEMENT;  Surgeon: Jackquline Denmark, MD;  Location: [facility];  Service: Endoscopy;  Laterality: N/A;   CATARACT EXTRACTION, BILATERAL     CHOLECYSTECTOMY     ERCP N/A 12/07/2020   Procedure: ENDOSCOPIC RETROGRADE CHOLANGIOPANCREATOGRAPHY (ERCP);  Surgeon: Jackquline Denmark, MD;  Location: [facility];  Service: Endoscopy;  Laterality: N/A;   ERCP     FEMUR IM NAIL Left 06/10/2020   Procedure: INTRAMEDULLARY (IM) NAIL FEMORAL;  Surgeon: Marybelle Killings, MD;  Location: [facility];  Service: Orthopedics;  Laterality: Left;   Social History   Socioeconomic History   Marital status: Single    Spouse name: Not on file   Number of children: Not on file   Years of education: Not on file   Highest education level: Not on file  Occupational History   Not on file  Tobacco Use   Smoking status: Never   Smokeless tobacco: Never  Vaping Use   Vaping Use: Never used  Substance and Sexual Activity   Alcohol use: Never   Drug use: Never   Sexual activity: Not Currently  Other Topics Concern   Not on file  Social History Narrative   Not on file   Social Determinants of Health   Financial Resource Strain: Not on file  Food Insecurity: Not on file  Transportation Needs: Not on file  Physical Activity: Not on file  Stress: Not on file  Social Connections: Not on file  Intimate Partner Violence: Not on file   Family History  Problem Relation Age of Onset   Colon cancer Neg Hx    Esophageal cancer Neg Hx    Inflammatory bowel disease Neg Hx    Liver disease Neg Hx    Pancreatic cancer Neg Hx    Rectal cancer Neg Hx    Stomach cancer Neg Hx    I have reviewed her medical, social, and family history in detail and updated the electronic medical record as necessary.    PHYSICAL EXAMINATION  BP 100/62   Pulse 82   Ht '5\' 1"'  (1.549 m)   Wt 84 lb 12.8  oz (38.5 kg)   SpO2 97%   BMI 16.02 kg/m  Wt Readings from Last 3 Encounters:  01/27/21 84 lb 12.8 oz (38.5 kg)  12/07/20 86 lb 10.3 oz (39.3 kg)  07/02/20 82 lb (37.2 kg)  GEN: NAD, appears stated age, doesn't appear chronically ill, accompanied by daughter PSYCH: Cooperative, without pressured speech EYE: Conjunctivae pink, sclerae anicteric ENT: MMM CV: Nontachycardic RESP: No audible wheezing GI: NABS, soft, NT/ND, without rebound or guarding MSK/EXT: No lower extremity edema SKIN: No jaundice NEURO:  Alert & Oriented x 2 (person/situation), no focal deficits   REVIEW OF DATA  I reviewed the following data at the time of this encounter:  GI Procedures and Studies  August 2022 ERCP - Massive CBD dilatation with choledocholithiasis. - Ascending cholangitis s/p Endo biliary stenting x 2. - S/P previous cholecystectomy.  Laboratory Studies  Reviewed those in epic and care everywhere  Imaging Studies  August 2022 CT abdomen pelvis with contrast IMPRESSION: 1. Status post cholecystectomy. Severe intra and extrahepatic biliary dilatation  with dilatation of the pancreatic duct concerning for obstructive process, no obvious pancreas mass lesion seen. Possible increased density within the distal common bile duct which could be due to stones, debris, or other ductal lesion, consider correlation with MRCP. Pneumobilia is present, presumably due to prior sphincterotomy. Correlate with surgical history. 2. Nonobstructing right kidney stone. Low lying right kidney in the upper pelvis.  September 2022 MRI/MRCP IMPRESSION: Markedly limited evaluation of the abdomen. Improved appearance of biliary duct distension is suggested. Signs of pneumobilia following stent placement.   Posterior division RIGHT hepatic ducts may contain biliary calculi which have potentially shifted following stent placement. Extensive pneumobilia limits assessment but and this dependent portion of the biliary  tree these filling defects or unusual. Given limitations of the current exam could consider further evaluation with noncontrast CT as some of these calculi were partially radiopaque on previous CT imaging.   ASSESSMENT  Ms. Henthorn is a 86 y.o. female with a pmh significant for cataracts, allergies, status post hysterectomy, status post partial colectomy for diverticular disease, cecal AVMs, status post cholecystectomy with recurrent choledocholithiasis now status post ERCPs with biliary stents.  The patient is seen today for evaluation and management of:  1. Choledocholithiasis   2. History of biliary duct stent placement   3. Ascending cholangitis   4. Biliary obstruction   5. History of ERCP   6. Abnormal LFTs   7. Postoperative nausea and vomiting    The patient is hemodynamically and clinically stable.  She has done well since her hospitalization for ascending cholangitis secondary to presumed biliary obstruction from choledocholithiasis recurrence.  Most recent MRI/MRCP imaging is not overtly showing the previously noted choledocholithiasis but there is chance that the stones may have migrated more proximally.  Hopefully with the biliary decompression in place, we will have a better means of trying to optimize the patient for DASE and/your EHL needs.  The risks of an ERCP were discussed at length, including but not limited to the risk of perforation, bleeding, abdominal pain, post-ERCP pancreatitis (while usually mild can be severe and even life threatening).  It was discussed that my goal will be to hopefully have complete stone extraction though with the size of the stones previously noted on imaging, it is possible she could require another ERCP, time will tell.  Preprocedure labs will be obtained today and available for anesthesia group.  The plan will be for the patient to also have post operative antiemetics available and I am sending a prescription in for that.  Will be ideal for the  patient to be given preprocedural antiemetics which will be discussed with our anesthesia service.  All patient and family questions were answered to the best of my ability, and the patient agrees to the aforementioned plan of action with follow-up as indicated.   PLAN  Preprocedure labs to be obtained Phenergan every 8 hour as needed nausea/vomiting to be administered for postprocedural use as needed Proceed with scheduling ERCP for DASE and EHL availability as needed and stent removal Preprocedural antiemetics strongly recommended   Orders Placed This Encounter  Procedures   CBC   Comp Met (CMET)   INR/PT    New Prescriptions   PROMETHAZINE (PHENERGAN) 12.5 MG TABLET    Take 1 tablet (12.5 mg total) by mouth every 8 (eight) hours as needed for nausea or vomiting.   Modified Medications   No medications on file    Planned Follow Up No follow-ups on file.   Total Time in  Face-to-Face and in Coordination of Care for patient including independent/personal interpretation/review of prior testing, medical history, examination, medication adjustment, communicating results with the patient directly, and documentation with the EHR is 30 minutes.   Justice Britain, MD [facility] Office # [phone]

## 2021-01-27 NOTE — Patient Instructions (Signed)
Your provider has requested that you go to the basement level for lab work before leaving today. Press "B" on the elevator. The lab is located at the first door on the left as you exit the elevator.   We have sent the following medications to your pharmacy for you to pick up at your convenience: Promethazine   Due to recent changes in healthcare laws, you may see the results of your imaging and laboratory studies on MyChart before your provider has had a chance to review them.  We understand that in some cases there may be results that are confusing or concerning to you. Not all laboratory results come back in the same time frame and the provider may be waiting for multiple results in order to interpret others.  Please give [place] 48 hours in order for your provider to thoroughly review all the results before contacting the office for clarification of your results.     If you are age 56 or older, your body mass index should be between 23-30. Your Body mass index is 16.02 kg/m. If this is out of the aforementioned range listed, please consider follow up with your Primary Care Provider.  If you are age 20 or younger, your body mass index should be between 19-25. Your Body mass index is 16.02 kg/m. If this is out of the aformentioned range listed, please consider follow up with your Primary Care Provider.   __________________________________________________________  The [facility] providers would like to encourage you to use [facility] to communicate with providers for non-urgent requests or questions.  Due to long hold times on the telephone, sending your provider a message by [facility] may be a faster and more efficient way to get a response.  Please allow 48 business hours for a response.  Please remember that this is for non-urgent requests.    Thank you for choosing me and [facility].  Dr. Meridee Score

## 2021-01-27 NOTE — H&P (View-Only) (Signed)
[place] VISIT   Primary Care Provider Lorene Dy, [place], [place] [address] [phone]  Referring Provider Dr. Lyndel Safe and Dr. Henrene Pastor  Patient Profile: Alison Carpenter is a 87 y.o. female with a pmh significant for cataracts, allergies, status post hysterectomy, status post partial colectomy for diverticular disease, cecal AVMs, status post cholecystectomy with recurrent choledocholithiasis now status post ERCPs with biliary stents.  The patient presents to the [facility] for an evaluation and management of problem(s) noted below:  Problem List 1. Choledocholithiasis   2. History of biliary duct stent placement   3. Ascending cholangitis   4. Biliary obstruction   5. History of ERCP   6. Abnormal LFTs   7. Postoperative nausea and vomiting     History of Present Illness This is a patient known to Dr. Henrene Pastor and Dr. Carlean Purl and Dr. Lyndel Safe.  The patient was admitted in August with abdominal pain, abnormal LFTs and found to have imaging concerning for biliary obstruction secondary to choledocholithiasis.  She underwent ERCP with significant findings of cholangitis as well as a significantly dilated biliary tree for which biliary stents were placed.  Antibiotics were continued and eventually she was able to be discharged.  Due to the complexity of the stones that were noted and the size of her bile duct as well as her overt cholangitis at her age, it was felt that stone extraction was not the safest plan at the time and that decompression and subsequent repeat ERCP would be a better means of treatment.  It is for this reason the patient is being evaluated by myself today for the need of potential EHL and DASE for removal of these large stones.  Patient underwent a recent MRI/MRCP which showed no evidence of a pancreatic mass and the previously noted choledocholithiasis was not as well defined imaging was concerning for  potential stone movement towards her proximal bile duct but was not completely defined on the MRI/MRCP.  The patient has not had any labs since her hospitalization.  Overall she has been doing well.  The previous abdominal pain is not present.  She still has some fatigue.  Patient's daughter states she is hopeful that the patient will not need to have repeat procedures as she has always had issues with nausea and vomiting after anesthesia.  The patient has previously had an ERCP years ago which had suggested previously passed biliary stones.  The most recent ERCP suggested a sphincterotomy was noted.  The patient does not take significant nonsteroidals or BC/Goody powders.  She has not noted significant darkening of her urine.  GI Review of Systems Positive as above Negative for pyrosis, dysphagia, odynophagia, nausea, vomiting, pain, alteration of bowel habits, melena, hematochezia  Review of Systems General: Denies fevers/chills/weight loss unintentionally Cardiovascular: Denies chest pain/palpitations Pulmonary: Denies shortness of breath Gastroenterological: See HPI Genitourinary: Denies darkened urine or hematuria Hematological: Denies easy bruising/bleeding Dermatological: Denies jaundice Psychological: Mood is stable   Medications Current Outpatient Medications  Medication Sig Dispense Refill   promethazine (PHENERGAN) 12.5 MG tablet Take 1 tablet (12.5 mg total) by mouth every 8 (eight) hours as needed for nausea or vomiting. 15 tablet 0   acetaminophen (TYLENOL) 500 MG tablet Take 1,000 mg by mouth every 6 (six) hours as needed for mild pain.     loratadine (CLARITIN) 10 MG tablet Take 10 mg by mouth daily as needed for allergies.     No current facility-administered medications for this visit.  Allergies Allergies  Allergen Reactions   Red Dye Anaphylaxis    Histories Past Medical History:  Diagnosis Date   Complication of anesthesia    PONV   Past Surgical History:   Procedure Laterality Date   ABDOMINAL HYSTERECTOMY     APPENDECTOMY     BILIARY STENT PLACEMENT N/A 12/07/2020   Procedure: BILIARY STENT PLACEMENT;  Surgeon: Jackquline Denmark, MD;  Location: [facility];  Service: Endoscopy;  Laterality: N/A;   CATARACT EXTRACTION, BILATERAL     CHOLECYSTECTOMY     ERCP N/A 12/07/2020   Procedure: ENDOSCOPIC RETROGRADE CHOLANGIOPANCREATOGRAPHY (ERCP);  Surgeon: Jackquline Denmark, MD;  Location: [facility];  Service: Endoscopy;  Laterality: N/A;   ERCP     FEMUR IM NAIL Left 06/10/2020   Procedure: INTRAMEDULLARY (IM) NAIL FEMORAL;  Surgeon: Marybelle Killings, MD;  Location: [facility];  Service: Orthopedics;  Laterality: Left;   Social History   Socioeconomic History   Marital status: Single    Spouse name: Not on file   Number of children: Not on file   Years of education: Not on file   Highest education level: Not on file  Occupational History   Not on file  Tobacco Use   Smoking status: Never   Smokeless tobacco: Never  Vaping Use   Vaping Use: Never used  Substance and Sexual Activity   Alcohol use: Never   Drug use: Never   Sexual activity: Not Currently  Other Topics Concern   Not on file  Social History Narrative   Not on file   Social Determinants of Health   Financial Resource Strain: Not on file  Food Insecurity: Not on file  Transportation Needs: Not on file  Physical Activity: Not on file  Stress: Not on file  Social Connections: Not on file  Intimate Partner Violence: Not on file   Family History  Problem Relation Age of Onset   Colon cancer Neg Hx    Esophageal cancer Neg Hx    Inflammatory bowel disease Neg Hx    Liver disease Neg Hx    Pancreatic cancer Neg Hx    Rectal cancer Neg Hx    Stomach cancer Neg Hx    I have reviewed her medical, social, and family history in detail and updated the electronic medical record as necessary.    PHYSICAL EXAMINATION  BP 100/62   Pulse 82   Ht '5\' 1"'  (1.549 m)   Wt 84 lb 12.8  oz (38.5 kg)   SpO2 97%   BMI 16.02 kg/m  Wt Readings from Last 3 Encounters:  01/27/21 84 lb 12.8 oz (38.5 kg)  12/07/20 86 lb 10.3 oz (39.3 kg)  07/02/20 82 lb (37.2 kg)  GEN: NAD, appears stated age, doesn't appear chronically ill, accompanied by daughter PSYCH: Cooperative, without pressured speech EYE: Conjunctivae pink, sclerae anicteric ENT: MMM CV: Nontachycardic RESP: No audible wheezing GI: NABS, soft, NT/ND, without rebound or guarding MSK/EXT: No lower extremity edema SKIN: No jaundice NEURO:  Alert & Oriented x 2 (person/situation), no focal deficits   REVIEW OF DATA  I reviewed the following data at the time of this encounter:  GI Procedures and Studies  August 2022 ERCP - Massive CBD dilatation with choledocholithiasis. - Ascending cholangitis s/p Endo biliary stenting x 2. - S/P previous cholecystectomy.  Laboratory Studies  Reviewed those in epic and care everywhere  Imaging Studies  August 2022 CT abdomen pelvis with contrast IMPRESSION: 1. Status post cholecystectomy. Severe intra and extrahepatic biliary dilatation  with dilatation of the pancreatic duct concerning for obstructive process, no obvious pancreas mass lesion seen. Possible increased density within the distal common bile duct which could be due to stones, debris, or other ductal lesion, consider correlation with MRCP. Pneumobilia is present, presumably due to prior sphincterotomy. Correlate with surgical history. 2. Nonobstructing right kidney stone. Low lying right kidney in the upper pelvis.  September 2022 MRI/MRCP IMPRESSION: Markedly limited evaluation of the abdomen. Improved appearance of biliary duct distension is suggested. Signs of pneumobilia following stent placement.   Posterior division RIGHT hepatic ducts may contain biliary calculi which have potentially shifted following stent placement. Extensive pneumobilia limits assessment but and this dependent portion of the biliary  tree these filling defects or unusual. Given limitations of the current exam could consider further evaluation with noncontrast CT as some of these calculi were partially radiopaque on previous CT imaging.   ASSESSMENT  Ms. Marinaccio is a 87 y.o. female with a pmh significant for cataracts, allergies, status post hysterectomy, status post partial colectomy for diverticular disease, cecal AVMs, status post cholecystectomy with recurrent choledocholithiasis now status post ERCPs with biliary stents.  The patient is seen today for evaluation and management of:  1. Choledocholithiasis   2. History of biliary duct stent placement   3. Ascending cholangitis   4. Biliary obstruction   5. History of ERCP   6. Abnormal LFTs   7. Postoperative nausea and vomiting    The patient is hemodynamically and clinically stable.  She has done well since her hospitalization for ascending cholangitis secondary to presumed biliary obstruction from choledocholithiasis recurrence.  Most recent MRI/MRCP imaging is not overtly showing the previously noted choledocholithiasis but there is chance that the stones may have migrated more proximally.  Hopefully with the biliary decompression in place, we will have a better means of trying to optimize the patient for DASE and/your EHL needs.  The risks of an ERCP were discussed at length, including but not limited to the risk of perforation, bleeding, abdominal pain, post-ERCP pancreatitis (while usually mild can be severe and even life threatening).  It was discussed that my goal will be to hopefully have complete stone extraction though with the size of the stones previously noted on imaging, it is possible she could require another ERCP, time will tell.  Preprocedure labs will be obtained today and available for anesthesia group.  The plan will be for the patient to also have post operative antiemetics available and I am sending a prescription in for that.  Will be ideal for the  patient to be given preprocedural antiemetics which will be discussed with our anesthesia service.  All patient and family questions were answered to the best of my ability, and the patient agrees to the aforementioned plan of action with follow-up as indicated.   PLAN  Preprocedure labs to be obtained Phenergan every 8 hour as needed nausea/vomiting to be administered for postprocedural use as needed Proceed with scheduling ERCP for DASE and EHL availability as needed and stent removal Preprocedural antiemetics strongly recommended   Orders Placed This Encounter  Procedures   CBC   Comp Met (CMET)   INR/PT    New Prescriptions   PROMETHAZINE (PHENERGAN) 12.5 MG TABLET    Take 1 tablet (12.5 mg total) by mouth every 8 (eight) hours as needed for nausea or vomiting.   Modified Medications   No medications on file    Planned Follow Up No follow-ups on file.   Total Time in  Face-to-Face and in Coordination of Care for patient including independent/personal interpretation/review of prior testing, medical history, examination, medication adjustment, communicating results with the patient directly, and documentation with the EHR is 30 minutes.   Justice Britain, MD [facility] Advanced [facility]oscopy Office # [phone]

## 2021-01-28 ENCOUNTER — Encounter: Payer: Self-pay | Admitting: Gastroenterology

## 2021-01-28 DIAGNOSIS — R112 Nausea with vomiting, unspecified: Secondary | ICD-10-CM | POA: Insufficient documentation

## 2021-01-28 DIAGNOSIS — K8309 Other cholangitis: Secondary | ICD-10-CM | POA: Insufficient documentation

## 2021-01-28 DIAGNOSIS — R7989 Other specified abnormal findings of blood chemistry: Secondary | ICD-10-CM | POA: Insufficient documentation

## 2021-01-28 DIAGNOSIS — K8050 Calculus of bile duct without cholangitis or cholecystitis without obstruction: Secondary | ICD-10-CM | POA: Insufficient documentation

## 2021-01-28 DIAGNOSIS — Z9889 Other specified postprocedural states: Secondary | ICD-10-CM | POA: Insufficient documentation

## 2021-01-30 NOTE — Anesthesia Preprocedure Evaluation (Addendum)
Anesthesia Evaluation  Patient identified by MRN, date of birth, ID band Patient awake    Reviewed: Allergy & Precautions, NPO status , Patient's Chart, lab work & pertinent test results  History of Anesthesia Complications (+) PONV and history of anesthetic complications  Airway Mallampati: II  TM Distance: >3 FB Neck ROM: Full    Dental  (+) Upper Dentures, Partial Lower   Pulmonary neg pulmonary ROS,    Pulmonary exam normal breath sounds clear to auscultation       Cardiovascular negative cardio ROS Normal cardiovascular exam Rhythm:Regular Rate:Normal  ECG: ST   Neuro/Psych negative neurological ROS  negative psych ROS   GI/Hepatic negative GI ROS, Neg liver ROS,   Endo/Other  negative endocrine ROS  Renal/GU negative Renal ROS     Musculoskeletal negative musculoskeletal ROS (+)   Abdominal   Peds  Hematology negative hematology ROS (+)   Anesthesia Other Findings biliary obstruction choledocholithiasis cholangitis  Reproductive/Obstetrics                            Anesthesia Physical Anesthesia Plan  ASA: 2  Anesthesia Plan: General   Post-op Pain Management:    Induction: Intravenous  PONV Risk Score and Plan: 4 or greater and Ondansetron, Dexamethasone, Treatment may vary due to age or medical condition and Propofol infusion  Airway Management Planned: Oral ETT  Additional Equipment:   Intra-op Plan:   Post-operative Plan: Extubation in OR  Informed Consent: I have reviewed the patients History and Physical, chart, labs and discussed the procedure including the risks, benefits and alternatives for the proposed anesthesia with the patient or authorized representative who has indicated his/her understanding and acceptance.     Dental advisory given  Plan Discussed with: CRNA  Anesthesia Plan Comments:        Anesthesia Quick Evaluation

## 2021-01-31 ENCOUNTER — Encounter (HOSPITAL_COMMUNITY)
Admission: RE | Disposition: A | Discharge status: Home / Self Care | Payer: Self-pay | Source: Home / Self Care | Attending: Gastroenterology

## 2021-01-31 ENCOUNTER — Encounter (HOSPITAL_COMMUNITY): Payer: Self-pay | Admitting: Gastroenterology

## 2021-01-31 ENCOUNTER — Ambulatory Visit (HOSPITAL_COMMUNITY)
Admission: RE | Admit: 2021-01-31 | Discharge: 2021-01-31 | Disposition: A | Discharge status: Home / Self Care | Payer: Medicare Other | Attending: Gastroenterology | Admitting: Gastroenterology

## 2021-01-31 ENCOUNTER — Telehealth: Payer: Self-pay

## 2021-01-31 ENCOUNTER — Other Ambulatory Visit: Payer: Self-pay

## 2021-01-31 ENCOUNTER — Ambulatory Visit (HOSPITAL_COMMUNITY): Payer: Medicare Other

## 2021-01-31 ENCOUNTER — Ambulatory Visit (HOSPITAL_COMMUNITY): Payer: Medicare Other | Admitting: Anesthesiology

## 2021-01-31 DIAGNOSIS — K838 Other specified diseases of biliary tract: Secondary | ICD-10-CM | POA: Diagnosis not present

## 2021-01-31 DIAGNOSIS — Z9689 Presence of other specified functional implants: Secondary | ICD-10-CM | POA: Insufficient documentation

## 2021-01-31 DIAGNOSIS — Z9049 Acquired absence of other specified parts of digestive tract: Secondary | ICD-10-CM | POA: Diagnosis not present

## 2021-01-31 DIAGNOSIS — Z9889 Other specified postprocedural states: Secondary | ICD-10-CM

## 2021-01-31 DIAGNOSIS — K8050 Calculus of bile duct without cholangitis or cholecystitis without obstruction: Secondary | ICD-10-CM | POA: Diagnosis not present

## 2021-01-31 DIAGNOSIS — Z4659 Encounter for fitting and adjustment of other gastrointestinal appliance and device: Secondary | ICD-10-CM

## 2021-01-31 DIAGNOSIS — K2289 Other specified disease of esophagus: Secondary | ICD-10-CM | POA: Insufficient documentation

## 2021-01-31 DIAGNOSIS — Z4689 Encounter for fitting and adjustment of other specified devices: Secondary | ICD-10-CM

## 2021-01-31 DIAGNOSIS — K222 Esophageal obstruction: Secondary | ICD-10-CM | POA: Insufficient documentation

## 2021-01-31 DIAGNOSIS — K831 Obstruction of bile duct: Secondary | ICD-10-CM

## 2021-01-31 DIAGNOSIS — K449 Diaphragmatic hernia without obstruction or gangrene: Secondary | ICD-10-CM | POA: Diagnosis not present

## 2021-01-31 DIAGNOSIS — K8071 Calculus of gallbladder and bile duct without cholecystitis with obstruction: Secondary | ICD-10-CM | POA: Insufficient documentation

## 2021-01-31 DIAGNOSIS — N200 Calculus of kidney: Secondary | ICD-10-CM | POA: Insufficient documentation

## 2021-01-31 DIAGNOSIS — K8309 Other cholangitis: Secondary | ICD-10-CM | POA: Diagnosis not present

## 2021-01-31 DIAGNOSIS — R7989 Other specified abnormal findings of blood chemistry: Secondary | ICD-10-CM

## 2021-01-31 HISTORY — DX: Other complications of anesthesia, initial encounter: T88.59XA

## 2021-01-31 HISTORY — PX: STENT REMOVAL: SHX6421

## 2021-01-31 HISTORY — PX: BILIARY DILATION: SHX6850

## 2021-01-31 HISTORY — PX: ENDOSCOPIC RETROGRADE CHOLANGIOPANCREATOGRAPHY (ERCP) WITH PROPOFOL: SHX5810

## 2021-01-31 HISTORY — PX: REMOVAL OF STONES: SHX5545

## 2021-01-31 HISTORY — PX: SPYGLASS CHOLANGIOSCOPY: SHX5441

## 2021-01-31 HISTORY — PX: SPHINCTEROTOMY: SHX5279

## 2021-01-31 HISTORY — PX: FOREIGN BODY REMOVAL: SHX962

## 2021-01-31 SURGERY — ENDOSCOPIC RETROGRADE CHOLANGIOPANCREATOGRAPHY (ERCP) WITH PROPOFOL
Anesthesia: General

## 2021-01-31 MED ORDER — FENTANYL CITRATE (PF) 100 MCG/2ML IJ SOLN
INTRAMUSCULAR | Status: AC
  Filled 2021-01-31: qty 2

## 2021-01-31 MED ORDER — CIPROFLOXACIN IN D5W 400 MG/200ML IV SOLN
INTRAVENOUS | Status: AC
  Filled 2021-01-31: qty 200

## 2021-01-31 MED ORDER — LIDOCAINE 2% (20 MG/ML) 5 ML SYRINGE
INTRAMUSCULAR | Status: DC | PRN
  Administered 2021-01-31: 40 mg via INTRAVENOUS

## 2021-01-31 MED ORDER — SODIUM CHLORIDE 0.9 % IV SOLN: INTRAVENOUS | Status: DC

## 2021-01-31 MED ORDER — INDOMETHACIN 50 MG RE SUPP
RECTAL | Status: AC
  Filled 2021-01-31: qty 2

## 2021-01-31 MED ORDER — SUCCINYLCHOLINE CHLORIDE 200 MG/10ML IV SOSY
PREFILLED_SYRINGE | INTRAVENOUS | Status: DC | PRN
  Administered 2021-01-31: 40 mg via INTRAVENOUS

## 2021-01-31 MED ORDER — INDOMETHACIN 50 MG RE SUPP
RECTAL | Status: DC | PRN
  Administered 2021-01-31: 100 mg via RECTAL

## 2021-01-31 MED ORDER — PHENYLEPHRINE HCL-NACL 20-0.9 MG/250ML-% IV SOLN
INTRAVENOUS | Status: DC | PRN
  Administered 2021-01-31: 10 ug/min via INTRAVENOUS

## 2021-01-31 MED ORDER — GLUCAGON HCL RDNA (DIAGNOSTIC) 1 MG IJ SOLR
INTRAMUSCULAR | Status: AC
  Filled 2021-01-31: qty 1

## 2021-01-31 MED ORDER — PROPOFOL 10 MG/ML IV BOLUS
INTRAVENOUS | Status: AC
  Filled 2021-01-31: qty 20

## 2021-01-31 MED ORDER — OMEPRAZOLE MAGNESIUM 20 MG PO TBEC: 20.0000 mg | DELAYED_RELEASE_TABLET | Freq: Two times a day (BID) | ORAL | 1 refills | Status: DC

## 2021-01-31 MED ORDER — EPINEPHRINE 1 MG/10ML IJ SOSY
PREFILLED_SYRINGE | INTRAMUSCULAR | Status: AC
  Filled 2021-01-31: qty 10

## 2021-01-31 MED ORDER — GLUCAGON HCL RDNA (DIAGNOSTIC) 1 MG IJ SOLR
INTRAMUSCULAR | Status: DC | PRN
  Administered 2021-01-31 (×3): .25 mg via INTRAVENOUS

## 2021-01-31 MED ORDER — PHENYLEPHRINE HCL (PRESSORS) 10 MG/ML IV SOLN
INTRAVENOUS | Status: AC
  Filled 2021-01-31: qty 3

## 2021-01-31 MED ORDER — DEXAMETHASONE SODIUM PHOSPHATE 10 MG/ML IJ SOLN
INTRAMUSCULAR | Status: DC | PRN
  Administered 2021-01-31: 5 mg via INTRAVENOUS

## 2021-01-31 MED ORDER — LACTATED RINGERS IV SOLN: INTRAVENOUS | Status: DC

## 2021-01-31 MED ORDER — LACTATED RINGERS IV SOLN
INTRAVENOUS | Status: AC | PRN
  Administered 2021-01-31: 1000 mL via INTRAVENOUS

## 2021-01-31 MED ORDER — ONDANSETRON HCL 4 MG/2ML IJ SOLN
INTRAMUSCULAR | Status: DC | PRN
  Administered 2021-01-31: 4 mg via INTRAVENOUS

## 2021-01-31 MED ORDER — FENTANYL CITRATE (PF) 100 MCG/2ML IJ SOLN
INTRAMUSCULAR | Status: DC | PRN
  Administered 2021-01-31: 50 ug via INTRAVENOUS
  Administered 2021-01-31: 25 ug via INTRAVENOUS

## 2021-01-31 MED ORDER — PROPOFOL 500 MG/50ML IV EMUL
INTRAVENOUS | Status: DC | PRN
  Administered 2021-01-31: 50 ug/kg/min via INTRAVENOUS

## 2021-01-31 MED ORDER — SODIUM CHLORIDE 0.9 % IV SOLN
INTRAVENOUS | Status: DC | PRN
  Administered 2021-01-31: 40 mL

## 2021-01-31 MED ORDER — CIPROFLOXACIN IN D5W 400 MG/200ML IV SOLN
INTRAVENOUS | Status: DC | PRN
  Administered 2021-01-31: 400 mg via INTRAVENOUS

## 2021-01-31 MED ORDER — PROPOFOL 10 MG/ML IV BOLUS
INTRAVENOUS | Status: DC | PRN
  Administered 2021-01-31: 50 mg via INTRAVENOUS

## 2021-01-31 NOTE — Interval H&P Note (Signed)
History and Physical Interval Note:  01/31/2021 7:35 AM  Alison Carpenter  has presented today for surgery, with the diagnosis of bili obstruction, choledocholithiasis cholangitis.  The various methods of treatment have been discussed with the patient and family. After consideration of risks, benefits and other options for treatment, the patient has consented to  Procedure(s): ENDOSCOPIC RETROGRADE CHOLANGIOPANCREATOGRAPHY (ERCP) WITH PROPOFOL (N/A) as a surgical intervention.  The patient's history has been reviewed, patient examined, no change in status, stable for surgery.  I have reviewed the patient's chart and labs.  Questions were answered to the patient's satisfaction.    The risks of an ERCP were discussed at length, including but not limited to the risk of perforation, bleeding, abdominal pain, post-ERCP pancreatitis (while usually mild can be severe and even life threatening).    [employer]

## 2021-01-31 NOTE — Telephone Encounter (Signed)
-----   Message from Lemar Lofty., MD sent at 01/31/2021 10:10 AM EDT ----- RG, ERCP complete with results as noted. Alison Carpenter, please move forward with scheduling LFTs in 2 to 3 weeks. Follow-up with me in 4 to 6 weeks in clinic. Thanks. GM

## 2021-01-31 NOTE — Op Note (Addendum)
[facility] Patient Name: Alison Carpenter Procedure Date: 01/31/2021 MRN: [id number] Attending MD: Justice Britain , MD Date of Birth: 1917/06/14 CSN: [id number] Age: 87 Admit Type: Outpatient Procedure:                ERCP Indications:              Bile duct stone(s), Common bile duct stone(s),                            Biliary stent removal Providers:                Justice Britain, MD, Kary Kos, RN, Cherylynn Ridges, Technician, Glenis Smoker, CRNA Referring MD:             Jackquline Denmark, MD Medicines:                General Anesthesia, Cipro 400 mg IV, Indomethacin                            100 mg PR, Glucagon 5.05 mg IV Complications:            Tear Estimated Blood Loss:     Estimated blood loss was minimal. Procedure:                Pre-Anesthesia Assessment:                           - Prior to the procedure, a History and Physical                            was performed, and patient medications and                            allergies were reviewed. The patient's tolerance of                            previous anesthesia was also reviewed. The risks                            and benefits of the procedure and the sedation                            options and risks were discussed with the patient.                            All questions were answered, and informed consent                            was obtained. Prior Anticoagulants: The patient has                            taken no previous anticoagulant or antiplatelet  agents. ASA Grade Assessment: III - A patient with                            severe systemic disease. After reviewing the risks                            and benefits, the patient was deemed in                            satisfactory condition to undergo the procedure.                           After obtaining informed consent, the scope was                             passed under direct vision. Throughout the                            procedure, the patient's blood pressure, pulse, and                            oxygen saturations were monitored continuously. The                            TJF-Q180V ([id number]) Olympus duodenoscope was                            introduced through the mouth, and used to inject                            contrast into and used for direct visualization of                            the bile duct. The ERCP was accomplished without                            difficulty. The patient tolerated the procedure. Scope In: Scope Out: Findings:      Biliary stents were visible on the scout film.      The esophagus was successfully intubated under direct vision without       detailed examination of the pharynx, larynx, and associated structures.       The upper GI tract was traversed under direct vision without detailed       examination but did require some careful maneuvering due to tortuosity.       A 2 cm hiatal hernia was present. A biliary sphincterotomy had been       performed. The sphincterotomy appeared open but was hidden under a large       hood. Two plastic biliary stents originating in the biliary tree were       emerging from the major papilla. The stents were visibly patent. Two       stents were removed from the biliary tree using a snare. There was some       bleeding that did occur after stent removal at the ampullary orifice  that slowed.      A short 0.035 inch Soft Jagwire was passed into the biliary tree. The       Hydratome sphincterotome was passed over the guidewire and the bile duct       was then deeply cannulated. Contrast was injected. I personally       interpreted the bile duct images. The flow of contrast through the ducts       was adequate. Image quality was adequate. Contrast extended to the       hepatic ducts. Opacification of the entire biliary tree except for the       gallbladder was  successful. The middle third of the main bile duct,       upper third of the main bile duct, left main hepatic duct and left       intrahepatic branches were moderately dilated. The largest diameter was       16 mm. The middle third of the main bile duct contained what was felt to       be a large filling defect thought to be a stone or biliary cast and       sludge. The biliary sphincterotomy was attempted to proceed with       extension for a total of 9 mm in length with a monofilament Hydratome       sphincterotome using ERBE electrocautery. I could not get much more due       to the hood. There was self limited oozing from the sphincterotomy which       did not require treatment. Dilation of the common bile duct with an       11-30-08 mm CRE balloon (to a maximum balloon size of 10 mm) dilator was       successful to perform sphincteroplasty for a total of 4 minutes -       hopefully to improve chances at DASE. To discover objects, the biliary       tree was swept with a retrieval balloon 9-12 mm initially and then to a       12-15 mm thereafter. Sludge was swept from the duct. One large 3 cm       stone was removed. No stones remained. An occlusion cholangiogram was       performed that showed some abnormal anatomical findings with what       appeared to be a gallbladder stump and a non-dilated right hepatic       system. Since the MRI/MRCP suggested potential for a more proximal CBD       stone, decision was made to pursue bile duct exploration endoscopically       using the SpyGlass direct visualization system. The SpyScope was       advanced to the bifurcation. Visibility with the scope was excellent. No       need for EHL.      The entire biliary tree was normal. pancreatogram was not performed.      The duodenoscope was withdrawn from the patient but I did notice that       there was some evidence of mucosal wrent at the Schatzki ring but also       some blood/hematin in the UES  region. I transitioned to a standard       esophagogastroduodenoscopy scope was used for the examination of the       upper gastrointestinal tract. The scope was passed under direct vision  through the upper GI tract. A non-bleeding mucosal wrent was noted just       below the UES proximally and at the Schatzki ring in the distal       esophagus. No evidence of perforation was noted.      The endoscope was withdrawn from the patient. Impression:               - 2 cm hiatal hernia.                           - Prior biliary sphincterotomy appeared open but                            hidden mostly under a hood.                           - Two visibly patent stents from the biliary tree                            were seen in the major papilla - these were removed.                           - A large filling defect consistent with a stone vs                            cast and sludge was seen on the cholangiogram in                            the middle/upper third of the biliary tree.                           - The upper third of the main bile duct, middle                            third of the main bile duct, left main hepatic duct                            and left intrahepatic branches were moderately                            dilated due to stone.                           - Choledocholithiasis was found. Complete removal                            was accomplished by biliary                            sphincterotomy/sphincteroplasty and balloon sweep                            via DASE.                           -  Minor mucosal wrents noted in the proximal UES                            region and at a Schatzki ring at hiatal hernia                            region - likely from scope passage. Moderate Sedation:      Not Applicable - Patient had care per Anesthesia. Recommendation:           - The patient will be observed post-procedure,                            until all  discharge criteria are met.                           - Discharge patient to home.                           - Patient has a contact number available for                            emergencies. The signs and symptoms of potential                            delayed complications were discussed with the                            patient. Return to normal activities tomorrow.                            Written discharge instructions were provided to the                            patient.                           - Avoid aspirin and nonsteroidal anti-inflammatory                            medicines.                           - Please use Cepacol or Halls Lozenges +/-                            Chloraseptic spray for next 72-96 hours to aid in                            sore thoat should you experience this.                           - Start Omeprazole 40 mg daily for next 1 month and                            then 20  mg daily for 1 month and then may stop.                           - Observe patient's clinical course.                           - Check liver enzymes (AST, ALT, alkaline                            phosphatase, bilirubin) in 2 weeks.                           - Observe patient's clinical course.                           - The findings and recommendations were discussed                            with the patient.                           - The findings and recommendations were discussed                            with the patient's family. Procedure Code(s):        --- Professional ---                           [phone], Endoscopic retrograde                            cholangiopancreatography (ERCP); with removal of                            foreign body(s) or stent(s) from biliary/pancreatic                            duct(s)                           43264, Endoscopic retrograde                            cholangiopancreatography (ERCP); with removal of                             calculi/debris from biliary/pancreatic duct(s)                           43273, Endoscopic cannulation of papilla with                            direct visualization of pancreatic/common bile                            duct(s) (List separately in addition to code(s) for  primary procedure) Diagnosis Code(s):        --- Professional ---                           K44.9, Diaphragmatic hernia without obstruction or                            gangrene                           Z96.89, Presence of other specified functional                            implants                           S27.818A, Other injury of esophagus (thoracic                            part), initial encounter                           K80.50, Calculus of bile duct without cholangitis                            or cholecystitis without obstruction                           Z46.59, Encounter for fitting and adjustment of                            other gastrointestinal appliance and device                           K83.8, Other specified diseases of biliary tract                           R93.2, Abnormal findings on diagnostic imaging of                            liver and biliary tract CPT copyright 2019 American Medical Association. All rights reserved. The codes documented in this report are preliminary and upon coder review may  be revised to meet current compliance requirements. Justice Britain, MD 01/31/2021 9:50:41 AM Number of Addenda: 0

## 2021-01-31 NOTE — Anesthesia Postprocedure Evaluation (Signed)
Anesthesia Post Note  Patient: Alison Carpenter  Procedure(s) Performed: ENDOSCOPIC RETROGRADE CHOLANGIOPANCREATOGRAPHY (ERCP) WITH PROPOFOL SPYGLASS CHOLANGIOSCOPY REMOVAL OF STONES BILIARY DILATION SPHINCTEROTOMY STENT REMOVAL x2 FOREIGN BODY REMOVAL     Patient location during evaluation: Endoscopy Anesthesia Type: General Level of consciousness: awake Pain management: pain level controlled Vital Signs Assessment: post-procedure vital signs reviewed and stable Respiratory status: spontaneous breathing, nonlabored ventilation, respiratory function stable and patient connected to nasal cannula oxygen Cardiovascular status: blood pressure returned to baseline and stable Postop Assessment: no apparent nausea or vomiting Anesthetic complications: no   No notable events documented.  Last Vitals:  Vitals:   01/31/21 1000 01/31/21 1010  BP: (!) 138/59 135/60  Pulse: 63 (!) 58  Resp: 10 (!) 9  Temp:    SpO2: 100% 100%    Last Pain:  Vitals:   01/31/21 1000  TempSrc:   PainSc: 0-No pain                 Mozell Haber P Phillips [employer]

## 2021-01-31 NOTE — Anesthesia Procedure Notes (Signed)
Procedure Name: Intubation Date/Time: 01/31/2021 7:46 AM Performed by: Minerva Ends, CRNA Pre-anesthesia Checklist: Patient identified, Emergency Drugs available, Suction available and Patient being monitored Patient Re-evaluated:Patient Re-evaluated prior to induction Oxygen Delivery Method: Circle System Utilized Preoxygenation: Pre-oxygenation with 100% oxygen Induction Type: IV induction Ventilation: Mask ventilation without difficulty Laryngoscope Size: Miller and 2 Grade View: Grade I Tube type: Oral Number of attempts: 1 Airway Equipment and Method: Stylet Placement Confirmation: ETT inserted through vocal cords under direct vision, positive ETCO2 and breath sounds checked- equal and bilateral Secured at: 20 cm Tube secured with: Tape Dental Injury: Teeth and Oropharynx as per pre-operative assessment  Comments: Smooth IV induction Ellender- intubation AM CRNA- atraumatic-- teeth and mouth as preop- full upper and partial lower given to daughter- lower teeth unchanged-- bilat BS Ellender

## 2021-01-31 NOTE — Transfer of Care (Signed)
Immediate Anesthesia Transfer of Care Note  Patient: Alison Carpenter  Procedure(s) Performed: ENDOSCOPIC RETROGRADE CHOLANGIOPANCREATOGRAPHY (ERCP) WITH PROPOFOL SPYGLASS CHOLANGIOSCOPY REMOVAL OF STONES BILIARY DILATION SPHINCTEROTOMY STENT REMOVAL x2  Patient Location: PACU and Endoscopy Unit  Anesthesia Type:General  Level of Consciousness: sedated  Airway & Oxygen Therapy: Patient Spontanous Breathing and Patient connected to face mask oxygen  Post-op Assessment: Report given to RN and Post -op Vital signs reviewed and stable  Post vital signs: Reviewed and stable  Last Vitals:  Vitals Value Taken Time  BP 132/56 01/31/21 0940  Temp    Pulse 66 01/31/21 0940  Resp 11 01/31/21 0940  SpO2 100 % 01/31/21 0940  Vitals shown include unvalidated device data.  Last Pain:  Vitals:   01/31/21 0939  TempSrc:   PainSc: 0-No pain         Complications: No notable events documented.

## 2021-01-31 NOTE — Discharge Instructions (Signed)
YOU HAD AN ENDOSCOPIC PROCEDURE TODAY: Refer to the procedure report and other information in the discharge instructions given to you for any specific questions about what was found during the examination. If this information does not answer your questions, please call [facility] office at [phone] to clarify.   YOU SHOULD EXPECT: Some feelings of bloating in the abdomen. Passage of more gas than usual. Walking can help get rid of the air that was put into your GI tract during the procedure and reduce the bloating. If you had a lower endoscopy (such as a colonoscopy or flexible sigmoidoscopy) you may notice spotting of blood in your stool or on the toilet paper. Some abdominal soreness may be present for a day or two, also.  DIET: Your first meal following the procedure should be a light meal and then it is ok to progress to your normal diet. A half-sandwich or bowl of soup is an example of a good first meal. Heavy or fried foods are harder to digest and may make you feel nauseous or bloated. Drink plenty of fluids but you should avoid alcoholic beverages for 24 hours. If you had a esophageal dilation, please see attached instructions for diet.    ACTIVITY: Your care partner should take you home directly after the procedure. You should plan to take it easy, moving slowly for the rest of the day. You can resume normal activity the day after the procedure however YOU SHOULD NOT DRIVE, use power tools, machinery or perform tasks that involve climbing or major physical exertion for 24 hours (because of the sedation medicines used during the test).   SYMPTOMS TO REPORT IMMEDIATELY: A gastroenterologist can be reached at any hour. Please call [phone]  for any of the following symptoms:   Following upper endoscopy (EGD, EUS, ERCP, esophageal dilation) Vomiting of blood or coffee ground material  New, significant abdominal pain  New, significant chest pain or pain under the shoulder blades  Painful or  persistently difficult swallowing  New shortness of breath  Black, tarry-looking or red, bloody stools  FOLLOW UP:  If any biopsies were taken you will be contacted by phone or by letter within the next 1-3 weeks. Call [phone]  if you have not heard about the biopsies in 3 weeks.  Please also call with any specific questions about appointments or follow up tests.  

## 2021-01-31 NOTE — Anesthesia Procedure Notes (Signed)
Date/Time: 01/31/2021 9:30 AM Performed by: Minerva Ends, CRNA Oxygen Delivery Method: Simple face mask Placement Confirmation: positive ETCO2 and breath sounds checked- equal and bilateral

## 2021-01-31 NOTE — Telephone Encounter (Signed)
Lab order has been entered  Follow up with GM on 03/09/21 at 250 pm Left message on machine to call back

## 2021-01-31 NOTE — Telephone Encounter (Signed)
The pt's daughter has been advised of the lab recommendation as well as the follow up appt.  She wanted to confirm that the pt is to take 40 mg omeprazole once daily for 1 month then 20 mg for 1 month then stop.  The pt has been advised of the information and verbalized understanding.

## 2021-01-31 NOTE — Telephone Encounter (Signed)
Patient daughter returned call. Also had questions about medications

## 2021-02-03 ENCOUNTER — Encounter (HOSPITAL_COMMUNITY): Payer: Self-pay | Admitting: Gastroenterology

## 2021-02-14 ENCOUNTER — Other Ambulatory Visit (INDEPENDENT_AMBULATORY_CARE_PROVIDER_SITE_OTHER): Payer: Medicare Other

## 2021-02-14 DIAGNOSIS — K831 Obstruction of bile duct: Secondary | ICD-10-CM

## 2021-02-14 DIAGNOSIS — R7989 Other specified abnormal findings of blood chemistry: Secondary | ICD-10-CM

## 2021-02-14 DIAGNOSIS — Z9889 Other specified postprocedural states: Secondary | ICD-10-CM

## 2021-02-14 DIAGNOSIS — K8050 Calculus of bile duct without cholangitis or cholecystitis without obstruction: Secondary | ICD-10-CM

## 2021-02-14 LAB — HEPATIC FUNCTION PANEL
ALT: 11 U/L (ref 0–35)
AST: 14 U/L (ref 0–37)
Albumin: 3.8 g/dL (ref 3.5–5.2)
Alkaline Phosphatase: 52 U/L (ref 39–117)
Bilirubin, Direct: 0.2 mg/dL (ref 0.0–0.3)
Total Bilirubin: 0.6 mg/dL (ref 0.2–1.2)
Total Protein: 6.3 g/dL (ref 6.0–8.3)

## 2021-03-09 ENCOUNTER — Encounter: Payer: Self-pay | Admitting: Gastroenterology

## 2021-03-09 ENCOUNTER — Ambulatory Visit (INDEPENDENT_AMBULATORY_CARE_PROVIDER_SITE_OTHER): Payer: Medicare Other | Admitting: Gastroenterology

## 2021-03-09 VITALS — BP 136/68 | HR 70 | Ht 61.0 in | Wt 82.0 lb

## 2021-03-09 DIAGNOSIS — K831 Obstruction of bile duct: Secondary | ICD-10-CM

## 2021-03-09 DIAGNOSIS — K8050 Calculus of bile duct without cholangitis or cholecystitis without obstruction: Secondary | ICD-10-CM | POA: Diagnosis not present

## 2021-03-09 DIAGNOSIS — Z9889 Other specified postprocedural states: Secondary | ICD-10-CM

## 2021-03-09 NOTE — Patient Instructions (Signed)
If you are age 85 or older, your body mass index should be between 23-30. Your Body mass index is 15.49 kg/m. If this is out of the aforementioned range listed, please consider follow up with your Primary Care Provider.  If you are age 63 or younger, your body mass index should be between 19-25. Your Body mass index is 15.49 kg/m. If this is out of the aformentioned range listed, please consider follow up with your Primary Care Provider.   ________________________________________________________  The [facility] providers would like to encourage you to use [facility] to communicate with providers for non-urgent requests or questions.  Due to long hold times on the telephone, sending your provider a message by [facility] may be a faster and more efficient way to get a response.  Please allow 48 business hours for a response.  Please remember that this is for non-urgent requests.  _______________________________________________________  Follow up as needed.   Thank you for choosing me and [facility].  Dr. Meridee Score

## 2021-03-09 NOTE — Progress Notes (Signed)
[place] VISIT   Primary Care Provider Lorene Dy, [address], [place] [address] [phone]  Referring Provider Dr. Lyndel Safe and Dr. Henrene Pastor  Patient Profile: Alison Carpenter is a 87 y.o. female with a pmh significant for cataracts, allergies, status post hysterectomy, status post partial colectomy for diverticular disease, cecal AVMs, status post cholecystectomy with recurrent choledocholithiasis (status post ERCPs with removal of choledocholithiasis after DASE).  The patient presents to the [facility] for an evaluation and management of problem(s) noted below:  Problem List 1. Choledocholithiasis   2. History of ERCP   3. Biliary obstruction      History of Present Illness Please see prior notes for full details of HPI.  Interval History The patient returns for scheduled follow-up.  Since our last visit we performed an ERCP with DASE.  We removed a 3 to 3-1/2 cm stone.  She did well postprocedure.  She has had repeat LFTs as noted in the chart and those are normal.  The patient is accompanied by her daughter today.  The patient and daughter state that she has been doing very well.  There have been no complications since her procedure.  She has not had any significant abdominal pain or nausea or vomiting.  The patient denies any issues with jaundice, scleral icterus, generalized pruritus, darkened/amber urine, clay-colored stools, LE edema, hematemesis, coffee-ground emesis, abdominal distention, confusion.  GI Review of Systems Positive as above Negative for dysphagia, odynophagia, nausea, vomiting, alteration of bowel habits   Review of Systems General: Denies fevers/chills/weight loss unintentionally Cardiovascular: Denies chest pain/palpitations Pulmonary: Denies shortness of breath Gastroenterological: See HPI Genitourinary: Denies darkened urine or hematuria Hematological: Denies easy  bruising/bleeding Dermatological: Denies jaundice Psychological: Mood is stable   Medications Current Outpatient Medications  Medication Sig Dispense Refill   acetaminophen (TYLENOL) 500 MG tablet Take 1,000 mg by mouth every 6 (six) hours as needed for mild pain.     loratadine (CLARITIN) 10 MG tablet Take 10 mg by mouth daily as needed for allergies.     omeprazole (PRILOSEC OTC) 20 MG tablet Take 1 tablet (20 mg total) by mouth 2 (two) times daily. 40 mg for 1 month and then 20 mg for 1 month then stop prescription. 60 tablet 1   No current facility-administered medications for this visit.    Allergies Allergies  Allergen Reactions   Red Dye Anaphylaxis    Histories Past Medical History:  Diagnosis Date   Complication of anesthesia    PONV   Past Surgical History:  Procedure Laterality Date   ABDOMINAL HYSTERECTOMY     APPENDECTOMY     BILIARY DILATION  01/31/2021   Procedure: BILIARY DILATION;  Surgeon: Rush Landmark Telford Nab., MD;  Location: [facility];  Service: Gastroenterology;;   BILIARY STENT PLACEMENT N/A 12/07/2020   Procedure: BILIARY STENT PLACEMENT;  Surgeon: Jackquline Denmark, MD;  Location: [facility];  Service: Endoscopy;  Laterality: N/A;   CATARACT EXTRACTION, BILATERAL     CHOLECYSTECTOMY     ENDOSCOPIC RETROGRADE CHOLANGIOPANCREATOGRAPHY (ERCP) WITH PROPOFOL N/A 01/31/2021   Procedure: ENDOSCOPIC RETROGRADE CHOLANGIOPANCREATOGRAPHY (ERCP) WITH PROPOFOL;  Surgeon: Rush Landmark Telford Nab., MD;  Location: [facility];  Service: Gastroenterology;  Laterality: N/A;   ERCP N/A 12/07/2020   Procedure: ENDOSCOPIC RETROGRADE CHOLANGIOPANCREATOGRAPHY (ERCP);  Surgeon: Jackquline Denmark, MD;  Location: [facility];  Service: Endoscopy;  Laterality: N/A;   ERCP     FEMUR IM NAIL Left 06/10/2020   Procedure: INTRAMEDULLARY (IM) NAIL FEMORAL;  Surgeon:  Eldred Manges, MD;  Location: [facility];  Service: Orthopedics;  Laterality: Left;   FOREIGN BODY REMOVAL  01/31/2021    Procedure: FOREIGN BODY REMOVAL;  Surgeon: Meridee Score Netty Starring., MD;  Location: [facility];  Service: Gastroenterology;;   REMOVAL OF STONES  01/31/2021   Procedure: REMOVAL OF STONES;  Surgeon: Lemar Lofty., MD;  Location: [facility];  Service: Gastroenterology;;   Dennison Mascot  01/31/2021   Procedure: Dennison Mascot;  Surgeon: Lemar Lofty., MD;  Location: [facility];  Service: Gastroenterology;;   Burman Freestone CHOLANGIOSCOPY N/A 01/31/2021   Procedure: XNATFTDD CHOLANGIOSCOPY;  Surgeon: Lemar Lofty., MD;  Location: [facility];  Service: Gastroenterology;  Laterality: N/A;   STENT REMOVAL  01/31/2021   Procedure: STENT REMOVAL x2;  Surgeon: Meridee Score Netty Starring., MD;  Location: [facility];  Service: Gastroenterology;;   Social History   Socioeconomic History   Marital status: Single    Spouse name: Not on file   Number of children: Not on file   Years of education: Not on file   Highest education level: Not on file  Occupational History   Not on file  Tobacco Use   Smoking status: Never   Smokeless tobacco: Never  Vaping Use   Vaping Use: Never used  Substance and Sexual Activity   Alcohol use: Never   Drug use: Never   Sexual activity: Not Currently  Other Topics Concern   Not on file  Social History Narrative   Not on file   Social Determinants of Health   Financial Resource Strain: Not on file  Food Insecurity: Not on file  Transportation Needs: Not on file  Physical Activity: Not on file  Stress: Not on file  Social Connections: Not on file  Intimate Partner Violence: Not on file   Family History  Problem Relation Age of Onset   Colon cancer Neg Hx    Esophageal cancer Neg Hx    Inflammatory bowel disease Neg Hx    Liver disease Neg Hx    Pancreatic cancer Neg Hx    Rectal cancer Neg Hx    Stomach cancer Neg Hx    I have reviewed her medical, social, and family history in detail and updated the electronic  medical record as necessary.    PHYSICAL EXAMINATION  BP 136/68   Pulse 70   Ht 5\' 1"  (1.549 m)   Wt 82 lb (37.2 kg)   BMI 15.49 kg/m  Wt Readings from Last 3 Encounters:  03/09/21 82 lb (37.2 kg)  01/31/21 84 lb 14 oz (38.5 kg)  01/27/21 84 lb 12.8 oz (38.5 kg)  GEN: NAD, appears stated age, doesn't appear chronically ill, accompanied by daughter PSYCH: Cooperative, without pressured speech EYE: Conjunctivae pink, sclerae anicteric ENT: Masked CV: Nontachycardic RESP: No audible wheezing GI: NABS, soft, NT/ND, without rebound or guarding MSK/EXT: No lower extremity edema SKIN: No jaundice NEURO:  Alert & Oriented x 3 (person/place/situation), no focal deficits   REVIEW OF DATA  I reviewed the following data at the time of this encounter:  GI Procedures and Studies  October 2022 ERCP - 2 cm hiatal hernia. - Prior biliary sphincterotomy appeared open but hidden mostly under a hood. - Two visibly patent stents from the biliary tree were seen in the major papilla - these were removed. - A large filling defect consistent with a stone vs cast and sludge was seen on the cholangiogram in the middle/upper third of the biliary tree. - The upper third of the main  bile duct, middle third of the main bile duct, left main hepatic duct and left intrahepatic branches were moderately dilated due to stone. - Choledocholithiasis was found. Complete removal was accomplished by biliary sphincterotomy/sphincteroplasty and balloon sweep via DASE. - Minor mucosal wrents noted in the proximal UES region and at a Schatzki ring at hiatal hernia region - likely from scope passage.  Laboratory Studies  Reviewed those in epic and care everywhere  Imaging Studies  No new imaging studies to review   ASSESSMENT  Ms. Jimmerson is a 87 y.o. female with a pmh significant for cataracts, allergies, status post hysterectomy, status post partial colectomy for diverticular disease, cecal AVMs, status post  cholecystectomy with recurrent choledocholithiasis (status post ERCPs with removal of choledocholithiasis after DASE).  The patient is seen today for evaluation and management of:  1. Choledocholithiasis   2. History of ERCP   3. Biliary obstruction    The patient is clinically and hemodynamically stable.  She has done well after her most recent ERCP with DASE for choledocholithiasis removal.  The patient is doing well.  The family corroborates this as well.  Although she may be at risk in the setting of the large bile duct for biliary sludge development and sludge ball development, that, if it does occur, usually takes years to develop and with her significant sphincteroplasty I think this will be less likely to occur in her lifetime.  At this point, I think her issues from a gallbladder/bile duct perspective are stable to significantly improved.  I am available if other issues develop in the future.  The patient and daughter were appreciative for the care that they have received through the [facility].  All patient questions were answered to the best of my ability, and the patient agrees to the aforementioned plan of action with follow-up as indicated.   PLAN  Should patient develop recurrence of symptoms or pain then she and family will let us know - LFTs/amylase/lipase/CBC to be obtained May continue omeprazole until prescription is complete I wish her the best moving forward   No orders of the defined types were placed in this encounter.   New Prescriptions   No medications on file   Modified Medications   No medications on file    Planned Follow Up No follow-ups on file.   Total Time in Face-to-Face and in Coordination of Care for patient including independent/personal interpretation/review of prior testing, medical history, examination, medication adjustment, communicating results with the patient directly, and documentation with the EHR is 15 minutes.   Justice Britain, MD [facility] # [phone]

## 2021-07-08 ENCOUNTER — Other Ambulatory Visit: Payer: Self-pay

## 2022-03-24 IMAGING — RF DG FEMUR 2+V*L*
1 series · 3 of 3 positions shown · non-contrast
Comparison: 06/10/2020

CLINICAL DATA: Left femoral fracture repair

EXAM:
LEFT FEMUR 2 VIEWS

[Series 1: unknown protocol · 0.14mm/px · 3 of 3 slices shown]
[im 1/3]
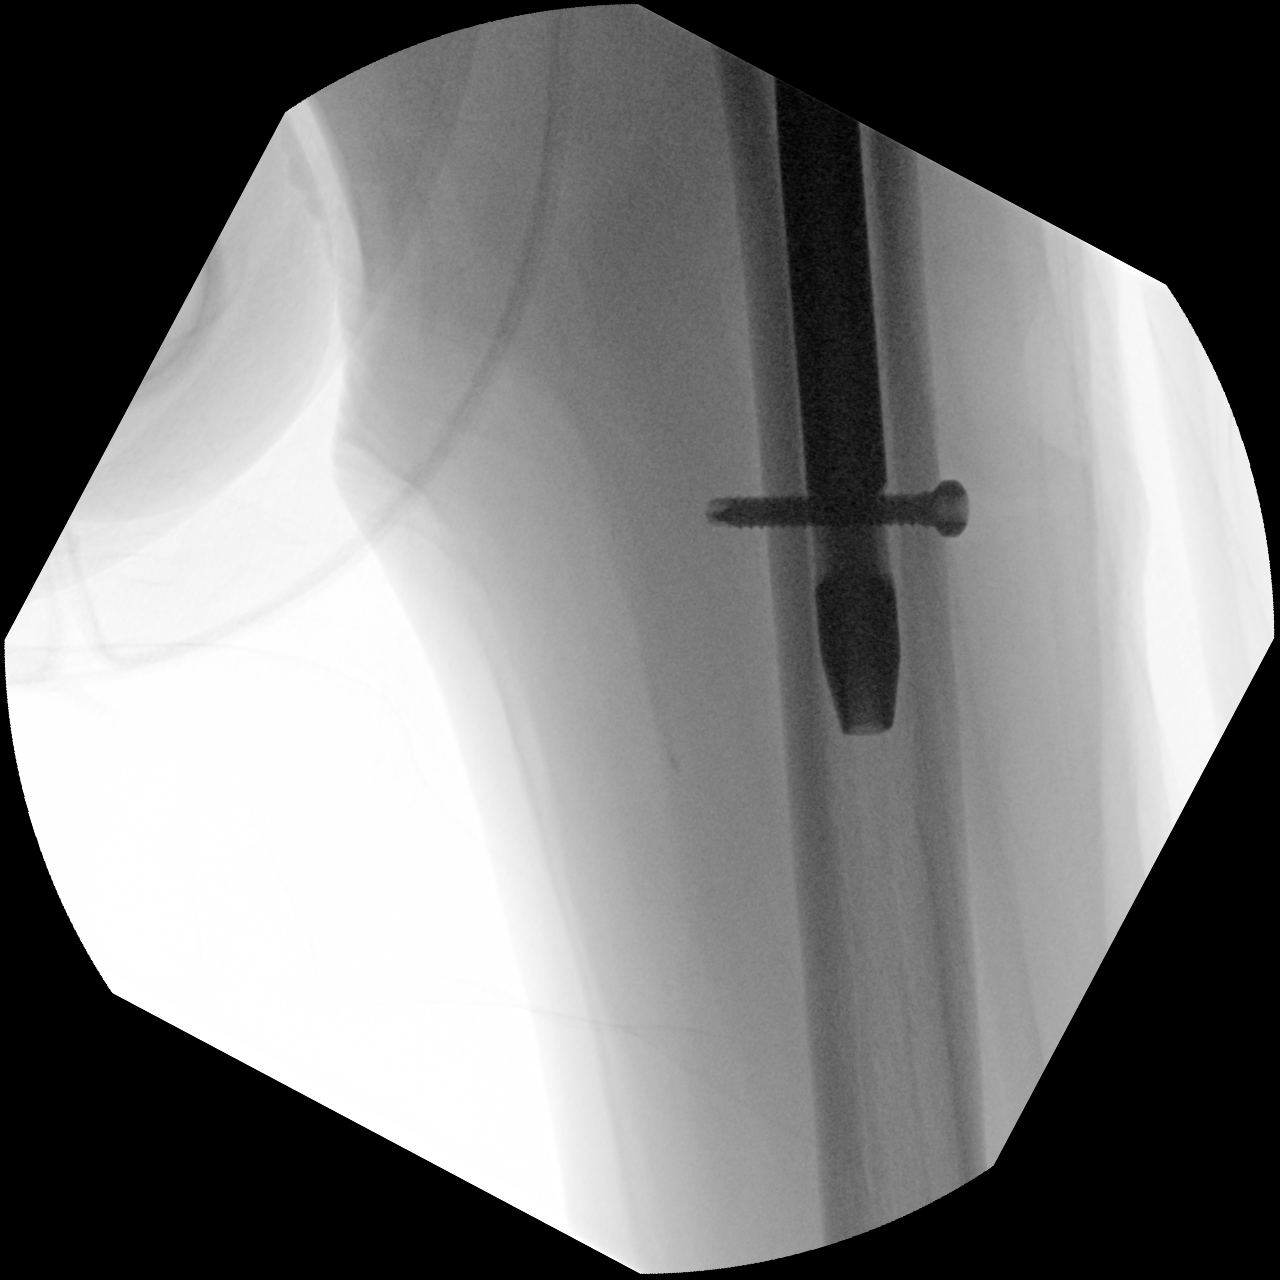
[im 2/3]
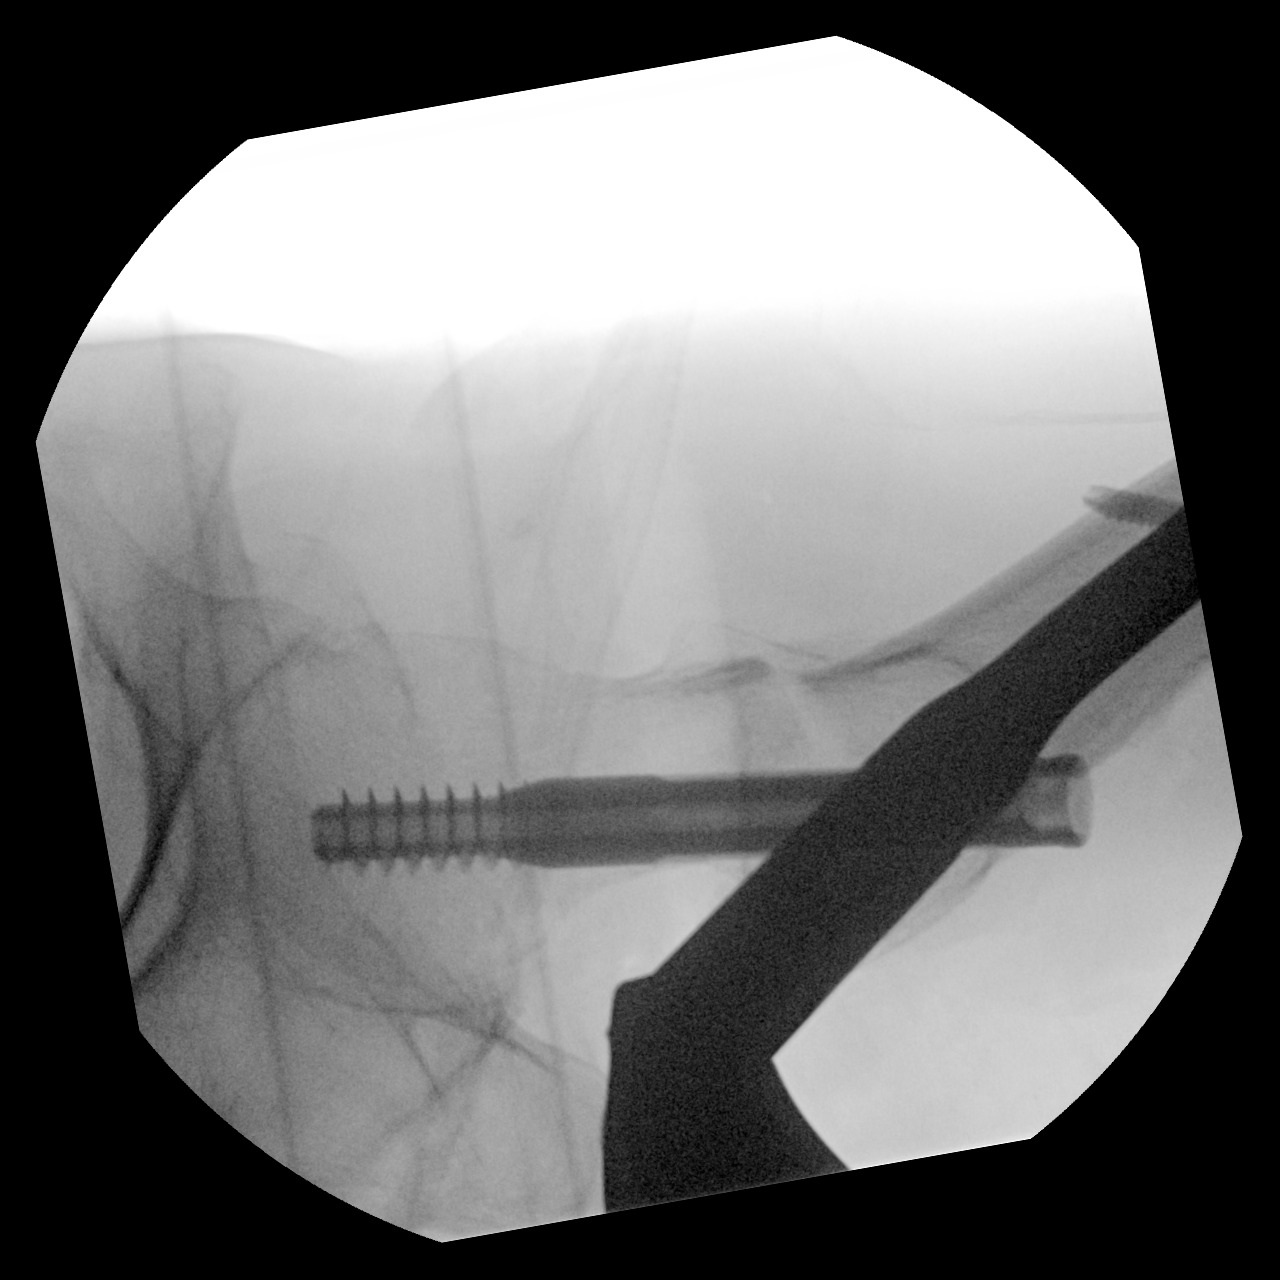
[im 3/3]
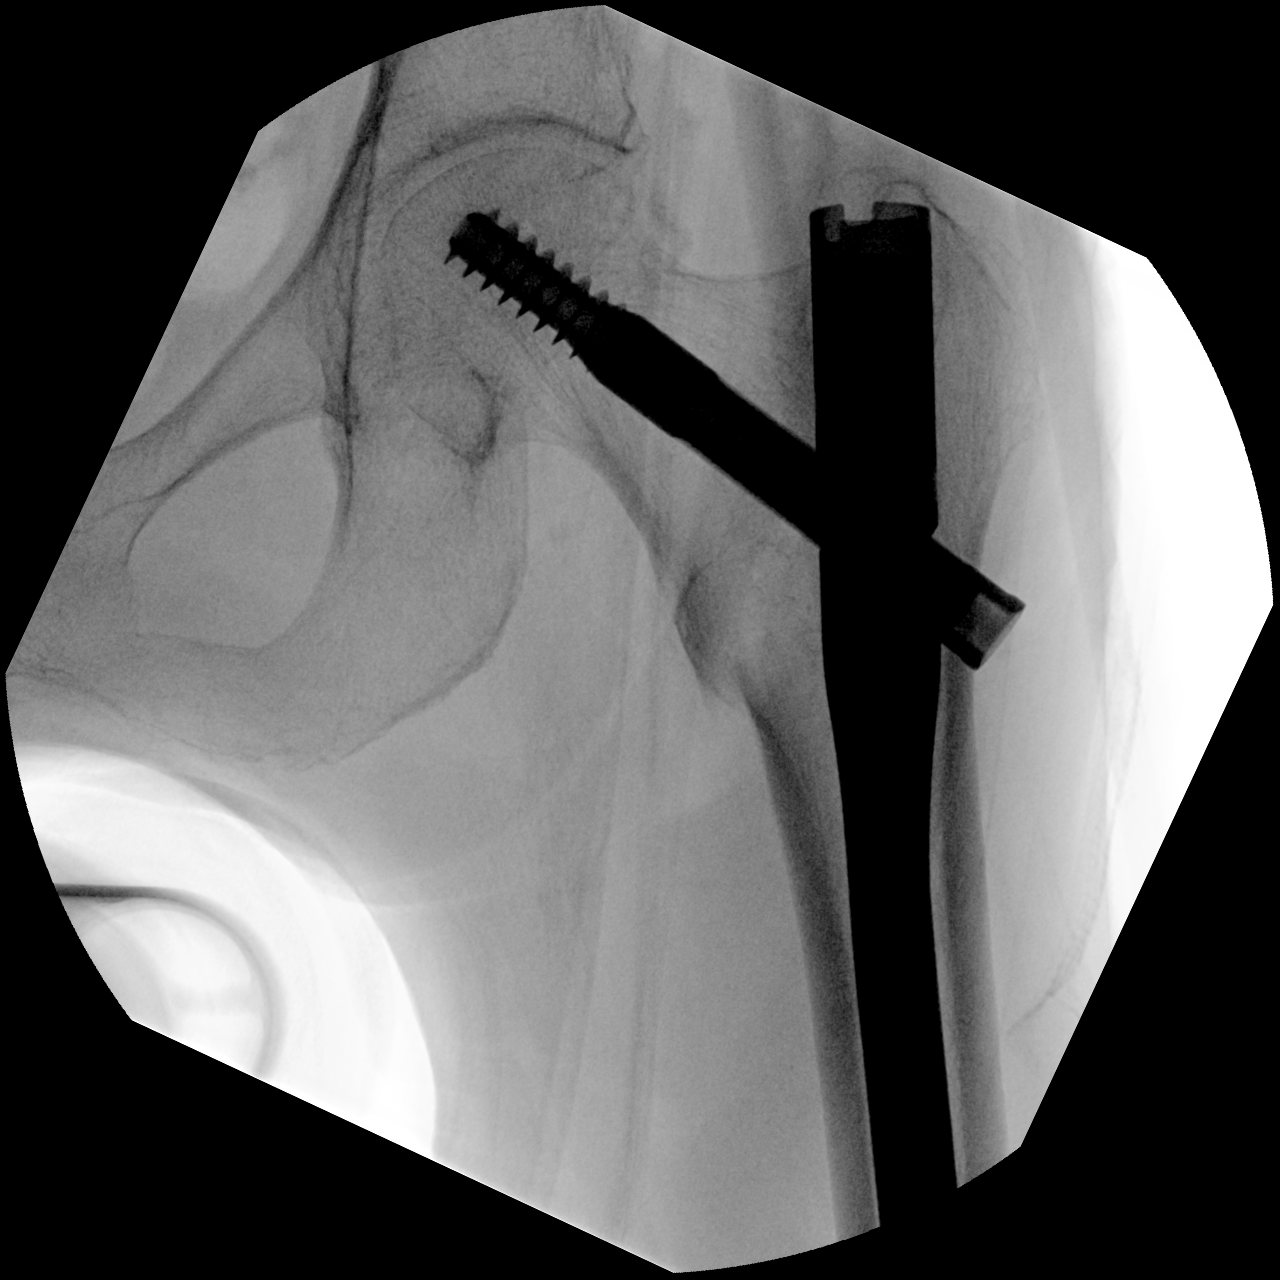

[3 of 3 positions shown; findings below may reference images not displayed]

FINDINGS: Three fluoroscopic images are obtained during the performance of the
procedure and are provided for interpretation only. Intramedullary
rod with proximal dynamic screw and distal interlocking screw
traverses the intertrochanteric left hip fracture seen previously.
Alignment is anatomic. Please refer to the operative report.
IMPRESSION: 1. ORIF of an intertrochanteric left hip fracture with near anatomic
alignment.

## 2022-03-24 IMAGING — CR DG HIP (WITH OR WITHOUT PELVIS) 2-3V*L*
3 series · 3 of 3 positions shown · non-contrast
Comparison: None.

CLINICAL DATA: Fall.

EXAM:
DG HIP (WITH OR WITHOUT PELVIS) 2-3V LEFT

[x pelvis (1 of 2)]
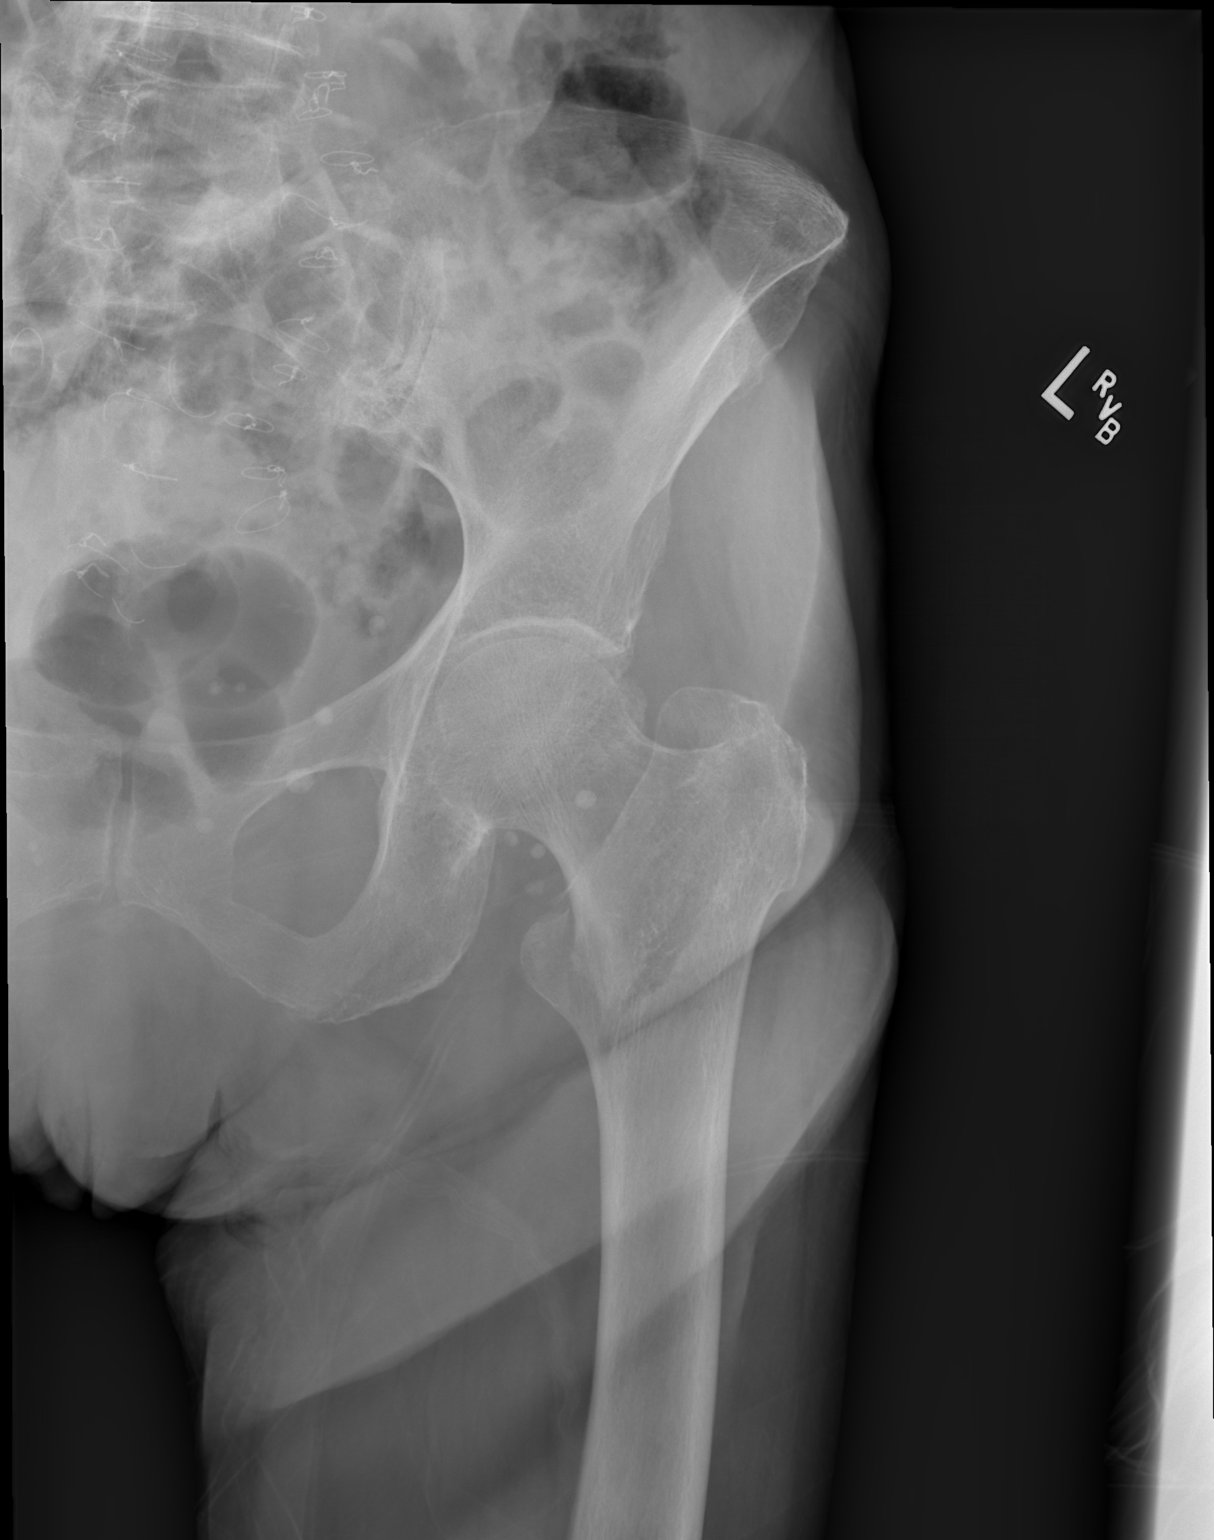

[x pelvis (2 of 2)]
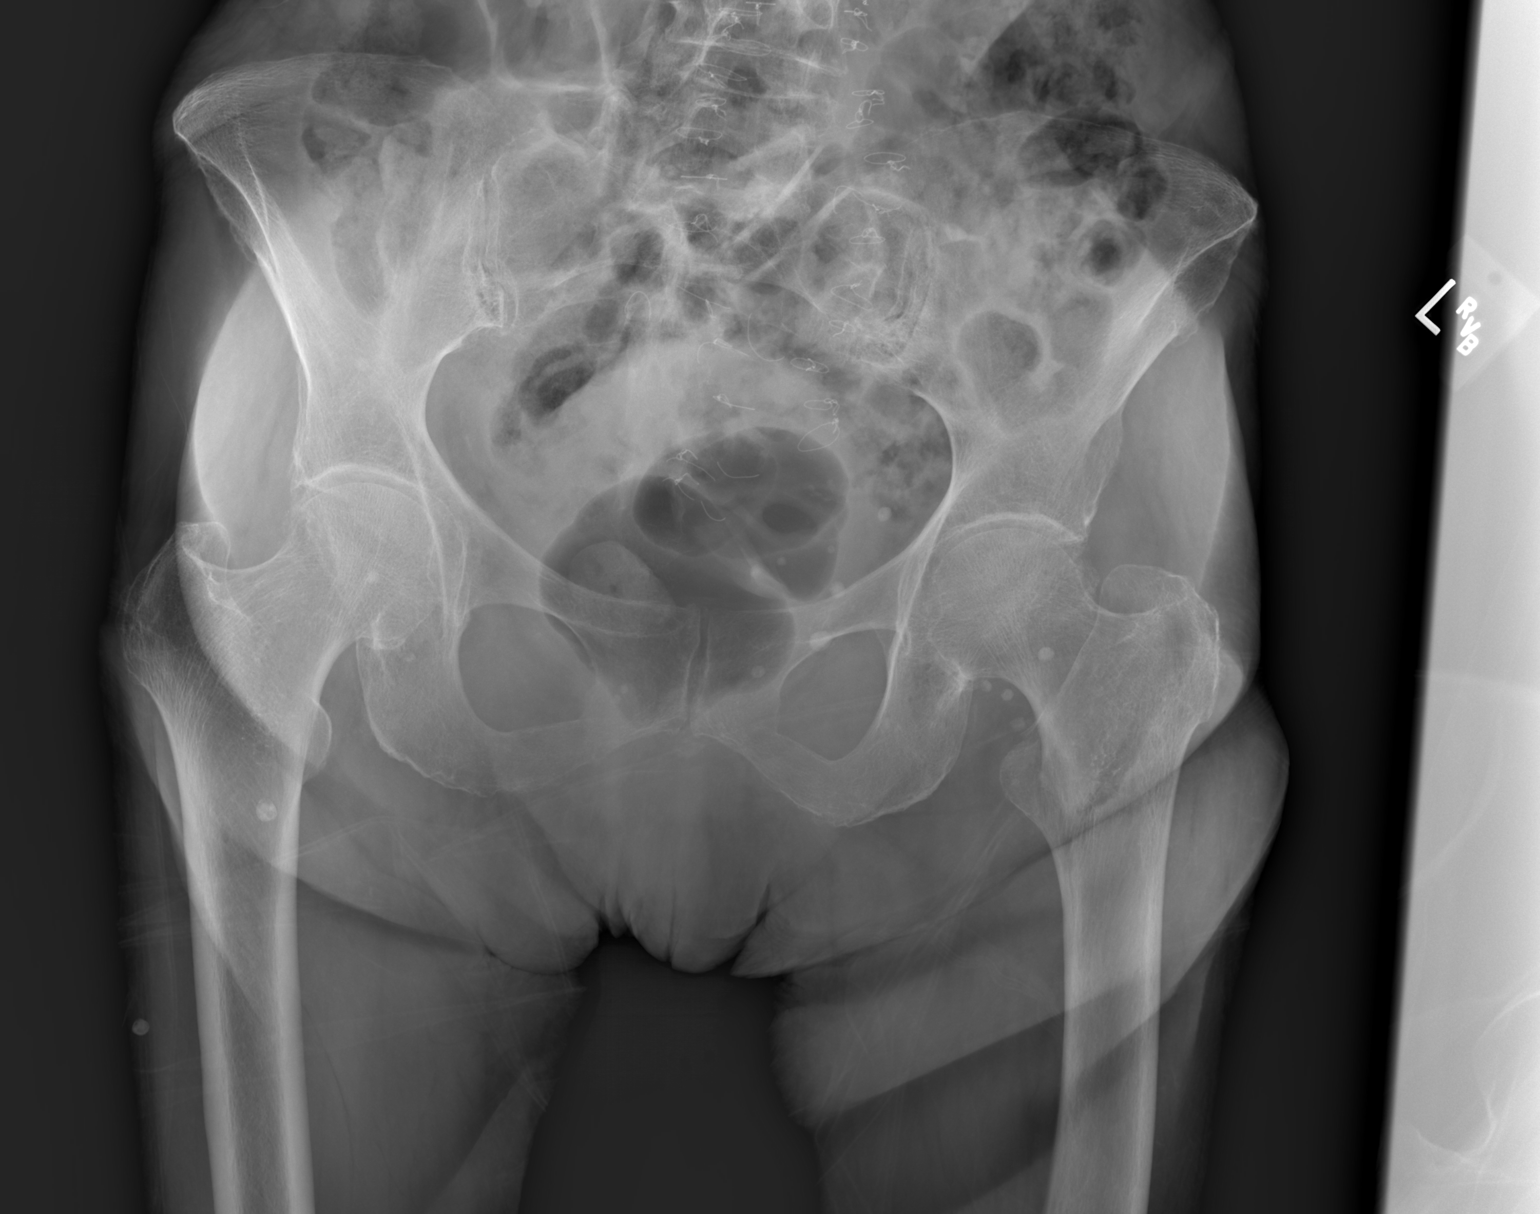

[w hip lat left]
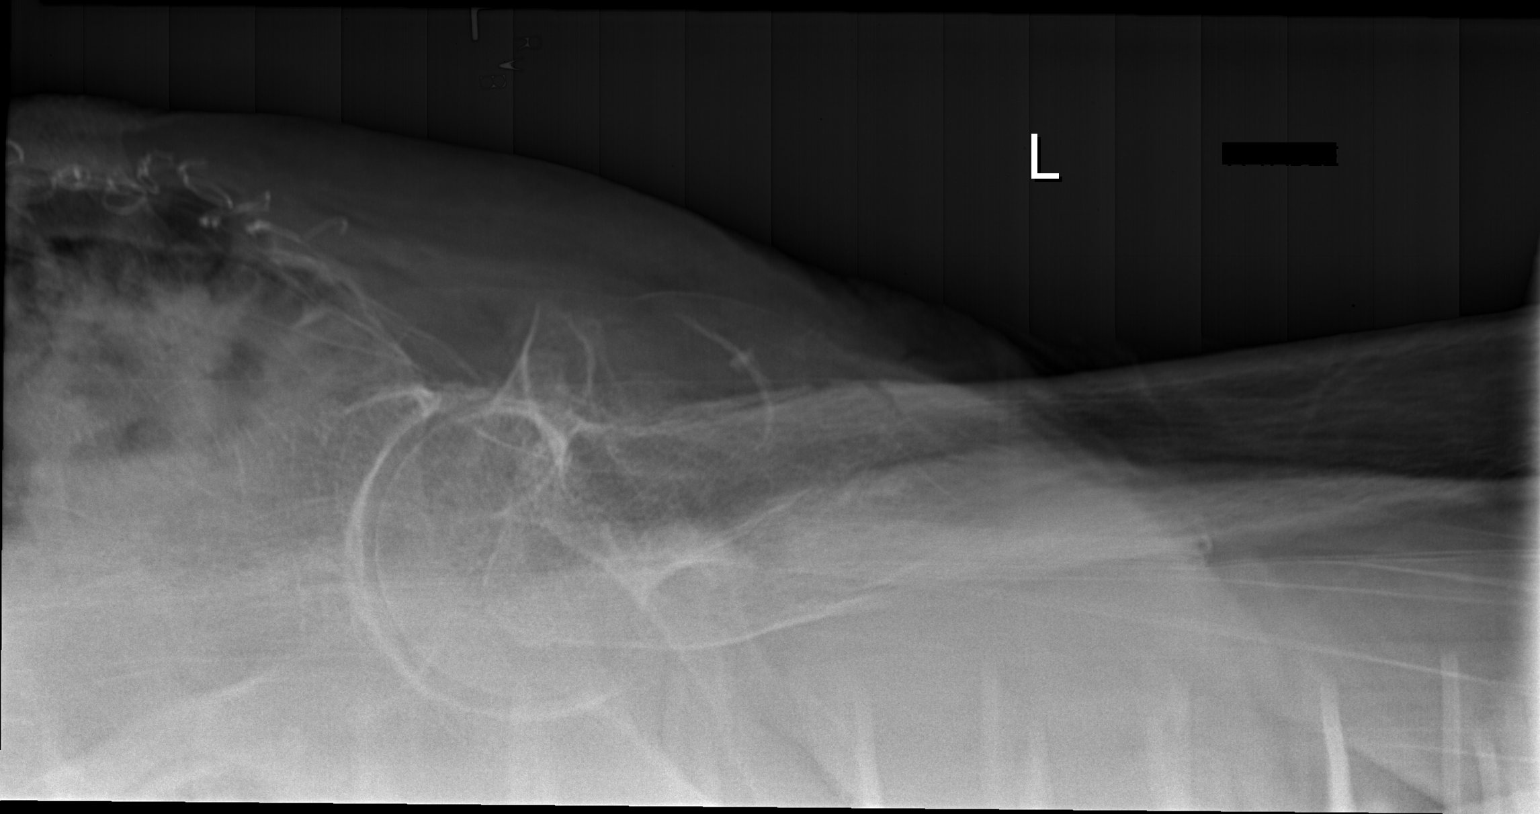

[3 of 3 positions shown; findings below may reference images not displayed]

FINDINGS: Acute nondisplaced left intertrochanteric femur fracture. No
dislocation. Mild bilateral hip joint space narrowing with small
marginal osteophytes. Osteopenia. Prior hernia repair.
IMPRESSION: 1. Acute nondisplaced left intertrochanteric femur fracture.

## 2023-07-06 ENCOUNTER — Observation Stay (HOSPITAL_COMMUNITY)
Admission: EM | Admit: 2023-07-06 | Discharge: 2023-07-07 | Disposition: A | Discharge status: Home / Self Care | Attending: Internal Medicine | Admitting: Internal Medicine

## 2023-07-06 ENCOUNTER — Observation Stay (HOSPITAL_COMMUNITY)

## 2023-07-06 ENCOUNTER — Encounter (HOSPITAL_COMMUNITY): Payer: Self-pay | Admitting: Internal Medicine

## 2023-07-06 ENCOUNTER — Other Ambulatory Visit: Payer: Self-pay

## 2023-07-06 ENCOUNTER — Emergency Department (HOSPITAL_COMMUNITY)

## 2023-07-06 DIAGNOSIS — E876 Hypokalemia: Principal | ICD-10-CM | POA: Diagnosis present

## 2023-07-06 DIAGNOSIS — R0781 Pleurodynia: Secondary | ICD-10-CM

## 2023-07-06 DIAGNOSIS — R079 Chest pain, unspecified: Secondary | ICD-10-CM | POA: Insufficient documentation

## 2023-07-06 DIAGNOSIS — Z955 Presence of coronary angioplasty implant and graft: Secondary | ICD-10-CM | POA: Insufficient documentation

## 2023-07-06 DIAGNOSIS — R0782 Intercostal pain: Secondary | ICD-10-CM

## 2023-07-06 DIAGNOSIS — K219 Gastro-esophageal reflux disease without esophagitis: Secondary | ICD-10-CM | POA: Diagnosis not present

## 2023-07-06 LAB — COMPREHENSIVE METABOLIC PANEL
ALT: 21 U/L (ref 0–44)
AST: 25 U/L (ref 15–41)
Albumin: 3.3 g/dL — ABNORMAL LOW (ref 3.5–5.0)
Alkaline Phosphatase: 84 U/L (ref 38–126)
Anion gap: 11 (ref 5–15)
BUN: 21 mg/dL (ref 8–23)
CO2: 30 mmol/L (ref 22–32)
Calcium: 8.5 mg/dL — ABNORMAL LOW (ref 8.9–10.3)
Chloride: 96 mmol/L — ABNORMAL LOW (ref 98–111)
Creatinine, Ser: 0.57 mg/dL (ref 0.44–1.00)
GFR, Estimated: >60 mL/min (ref >60)
Glucose, Bld: 93 mg/dL (ref 70–99)
Potassium: 2.5 mmol/L — CL (ref 3.5–5.1)
Sodium: 137 mmol/L (ref 135–145)
Total Bilirubin: 1.2 mg/dL (ref 0.0–1.2)
Total Protein: 6.2 g/dL — ABNORMAL LOW (ref 6.5–8.1)

## 2023-07-06 LAB — CBC WITH DIFFERENTIAL/PLATELET
Abs Immature Granulocytes: 0.02 10*3/uL (ref 0.00–0.07)
Basophils Absolute: 0.1 10*3/uL (ref 0.0–0.1)
Basophils Relative: 1 %
Eosinophils Absolute: 0.1 10*3/uL (ref 0.0–0.5)
Eosinophils Relative: 1 %
HCT: 37.3 % (ref 36.0–46.0)
Hemoglobin: 12.5 g/dL (ref 12.0–15.0)
Immature Granulocytes: 0 %
Lymphocytes Relative: 22 %
Lymphs Abs: 1.9 10*3/uL (ref 0.7–4.0)
MCH: 31.7 pg (ref 26.0–34.0)
MCHC: 33.5 g/dL (ref 30.0–36.0)
MCV: 94.7 fL (ref 80.0–100.0)
Monocytes Absolute: 0.9 10*3/uL (ref 0.1–1.0)
Monocytes Relative: 10 %
Neutro Abs: 5.8 10*3/uL (ref 1.7–7.7)
Neutrophils Relative %: 66 %
Platelets: 242 10*3/uL (ref 150–400)
RBC: 3.94 MIL/uL (ref 3.87–5.11)
RDW: 13.4 % (ref 11.5–15.5)
WBC: 8.8 10*3/uL (ref 4.0–10.5)
nRBC: 0 % (ref 0.0–0.2)

## 2023-07-06 LAB — CK: Total CK: 36 U/L — ABNORMAL LOW (ref 38–234)

## 2023-07-06 LAB — TROPONIN I (HIGH SENSITIVITY): Troponin I (High Sensitivity): 16 ng/L (ref <18)

## 2023-07-06 LAB — MAGNESIUM: Magnesium: 1 mg/dL — ABNORMAL LOW (ref 1.7–2.4)

## 2023-07-06 MED ORDER — POTASSIUM CHLORIDE CRYS ER 20 MEQ PO TBCR
40.0000 meq | EXTENDED_RELEASE_TABLET | Freq: Once | ORAL | Status: AC
  Administered 2023-07-06: 40 meq via ORAL
  Filled 2023-07-06: qty 4

## 2023-07-06 MED ORDER — HEPARIN SODIUM (PORCINE) 5000 UNIT/ML IJ SOLN
5000.0000 [IU] | Freq: Three times a day (TID) | INTRAMUSCULAR | Status: DC
  Administered 2023-07-07 (×2): 5000 [IU] via SUBCUTANEOUS
  Filled 2023-07-06: qty 1

## 2023-07-06 MED ORDER — OMEPRAZOLE MAGNESIUM 20 MG PO TBEC: 20.0000 mg | DELAYED_RELEASE_TABLET | Freq: Two times a day (BID) | ORAL | Status: DC

## 2023-07-06 MED ORDER — MAGNESIUM SULFATE 2 GM/50ML IV SOLN
2.0000 g | Freq: Once | INTRAVENOUS | Status: AC
  Administered 2023-07-06: 2 g via INTRAVENOUS
  Filled 2023-07-06: qty 50

## 2023-07-06 MED ORDER — SODIUM CHLORIDE 0.9 % IV BOLUS
500.0000 mL | Freq: Once | INTRAVENOUS | Status: AC
  Administered 2023-07-06: 500 mL via INTRAVENOUS

## 2023-07-06 MED ORDER — ACETAMINOPHEN 325 MG PO TABS: 650.0000 mg | ORAL_TABLET | Freq: Once | ORAL | Status: DC

## 2023-07-06 MED ORDER — FAMOTIDINE IN NACL 20-0.9 MG/50ML-% IV SOLN
20.0000 mg | Freq: Once | INTRAVENOUS | Status: AC
  Administered 2023-07-07: 20 mg via INTRAVENOUS
  Filled 2023-07-06: qty 50

## 2023-07-06 MED ORDER — POTASSIUM CHLORIDE 10 MEQ/100ML IV SOLN
10.0000 meq | INTRAVENOUS | Status: AC
  Administered 2023-07-06 – 2023-07-07 (×6): 10 meq via INTRAVENOUS
  Filled 2023-07-06 (×5): qty 100

## 2023-07-06 MED ORDER — IOHEXOL 350 MG/ML SOLN
75.0000 mL | Freq: Once | INTRAVENOUS | Status: AC | PRN
  Administered 2023-07-06: 75 mL via INTRAVENOUS

## 2023-07-06 MED ORDER — PANTOPRAZOLE SODIUM 40 MG PO TBEC: 40.0000 mg | DELAYED_RELEASE_TABLET | Freq: Two times a day (BID) | ORAL | Status: DC

## 2023-07-06 MED ORDER — POTASSIUM CHLORIDE 10 MEQ/100ML IV SOLN
10.0000 meq | INTRAVENOUS | Status: DC
  Administered 2023-07-06: 10 meq via INTRAVENOUS
  Filled 2023-07-06: qty 100

## 2023-07-06 MED ORDER — VITAMIN D 25 MCG (1000 UNIT) PO TABS: 1000.0000 [IU] | ORAL_TABLET | Freq: Every day | ORAL | Status: DC

## 2023-07-06 MED ORDER — ALUM & MAG HYDROXIDE-SIMETH 200-200-20 MG/5ML PO SUSP
30.0000 mL | Freq: Once | ORAL | Status: DC
  Filled 2023-07-06: qty 30

## 2023-07-06 NOTE — ED Provider Notes (Signed)
 [place] EMERGENCY DEPARTMENT AT [facility] Provider Note   CSN: [id number] Arrival date & time: 07/06/23  1925     History  Chief Complaint  Patient presents with   Chest Pain    Right Rib pain    Alison Carpenter is a 89 y.o. female history of previous cholelithiasis status post ERCP, here presenting with right sided rib pain.  Patient has intermittent right rib pain for the last 2 days.  Patient cannot tell me what gets worse or better.  Denies any shortness of breath.  Denies any rash.  Patient denies any left-sided chest pain.  Patient is fairly healthy other than previous choledocholithiasis.  Patient adamantly denies any abdominal pain or vomiting.  The history is provided by the patient.       Home Medications Prior to Admission medications   Medication Sig Start Date End Date Taking? Authorizing Provider  acetaminophen (TYLENOL) 500 MG tablet Take 1,000 mg by mouth every 6 (six) hours as needed for mild pain.    [provider]  loratadine (CLARITIN) 10 MG tablet Take 10 mg by mouth daily as needed for allergies.    [provider]  omeprazole (PRILOSEC OTC) 20 MG tablet Take 1 tablet (20 mg total) by mouth 2 (two) times daily. 40 mg for 1 month and then 20 mg for 1 month then stop prescription. 01/31/21 01/31/22  Mansouraty, Netty Starring., MD      Allergies    Red dye #40 (allura red)    Review of Systems   Review of Systems  Cardiovascular:  Positive for chest pain.  All other systems reviewed and are negative.   Physical Exam Updated Vital Signs BP (!) 147/91 (BP Location: Left Arm)   Pulse 89   Temp 98.2 F (36.8 C) (Oral)   Resp 17   SpO2 98%  Physical Exam Vitals and nursing note reviewed.  Constitutional:      Comments: Chronically ill but well appearing for age  HENT:     Head: Normocephalic.  Eyes:     Extraocular Movements: Extraocular movements intact.     Pupils: Pupils are equal, round, and reactive to  light.  Cardiovascular:     Rate and Rhythm: Normal rate and regular rhythm.     Heart sounds: Normal heart sounds.  Pulmonary:     Effort: Pulmonary effort is normal.     Breath sounds: Normal breath sounds.  Chest:     Comments: Patient has no obvious bruising of the ribs or tenderness.  Patient has no obvious rash consistent with shingles.  Skin is nontender to touch Musculoskeletal:        General: Normal range of motion.     Cervical back: Normal range of motion and neck supple.  Skin:    General: Skin is warm.     Capillary Refill: Capillary refill takes less than 2 seconds.  Neurological:     General: No focal deficit present.     Mental Status: She is alert and oriented to person, place, and time.     ED Results / Procedures / Treatments   Labs (all labs ordered are listed, but only abnormal results are displayed) Labs Reviewed  COMPREHENSIVE METABOLIC PANEL - Abnormal; Notable for the following components:      Result Value   Potassium 2.5 (*)    Chloride 96 (*)    Calcium 8.5 (*)    Total Protein 6.2 (*)    Albumin 3.3 (*)  All other components within normal limits  MAGNESIUM - Abnormal; Notable for the following components:   Magnesium 1.0 (*)    All other components within normal limits  CK - Abnormal; Notable for the following components:   Total CK 36 (*)    All other components within normal limits  CBC WITH DIFFERENTIAL/PLATELET  TROPONIN I (HIGH SENSITIVITY)    EKG EKG Interpretation Date/Time:  Friday July 06 2023 19:48:43 EDT Ventricular Rate:  93 PR Interval:  146 QRS Duration:  84 QT Interval:  380 QTC Calculation: 472 R Axis:   -57  Text Interpretation: Sinus rhythm with Premature atrial complexes Left anterior fascicular block Lateral infarct , age undetermined Abnormal ECG When compared with ECG of 06-Dec-2020 16:30, No significant change since last tracing Confirmed by Richardean Canal [phone]) on 07/06/2023 8:18:30 PM  Radiology DG Chest  2 View Result Date: 07/06/2023 CLINICAL DATA:  Chest pain EXAM: CHEST - 2 VIEW COMPARISON:  12/06/2020 FINDINGS: Cardiomegaly. Aortic atherosclerosis. No confluent opacity, effusion or edema. No acute bony abnormality. IMPRESSION: Cardiomegaly.  No active disease. Electronically Signed   By: Charlett Nose M.D.   On: 07/06/2023 21:23    Procedures Procedures    CRITICAL CARE Performed by: Richardean Canal   Total critical care time: 39 minutes  Critical care time was exclusive of separately billable procedures and treating other patients.  Critical care was necessary to treat or prevent imminent or life-threatening deterioration.  Critical care was time spent personally by me on the following activities: development of treatment plan with patient and/or surrogate as well as nursing, discussions with consultants, evaluation of patient's response to treatment, examination of patient, obtaining history from patient or surrogate, ordering and performing treatments and interventions, ordering and review of laboratory studies, ordering and review of radiographic studies, pulse oximetry and re-evaluation of patient's condition.   Medications Ordered in ED Medications  potassium chloride 10 mEq in 100 mL IVPB (10 mEq Intravenous New Bag/Given 07/06/23 2210)  magnesium sulfate IVPB 2 g 50 mL (has no administration in time range)  alum & mag hydroxide-simeth (MAALOX/MYLANTA) 200-200-20 MG/5ML suspension 30 mL (has no administration in time range)  famotidine (PEPCID) IVPB 20 mg premix (has no administration in time range)  sodium chloride 0.9 % bolus 500 mL (500 mLs Intravenous New Bag/Given 07/06/23 2209)  potassium chloride SA (KLOR-CON M) CR tablet 40 mEq (40 mEq Oral Given 07/06/23 2145)    ED Course/ Medical Decision Making/ A&P                                 Medical Decision Making Alison Carpenter is a 89 y.o. female here presenting with right rib pain.  I do not see any tenderness or  signs of deformity or trauma.  Patient has no rash to suggest shingles. I think likely muscle strain.  Given that she is 89,  I want to get blood work and also troponin x 2 to rule out ACS.  10:21 PM I reviewed patient's labs and potassium was 2.5.  Patient had a hard time swallowing the potassium pill.  Magnesium also was low at 1.0.  I discussed with the grandson at the bedside.  He states that she has poor appetite at baseline and they tried to get her to eat and she usually drink some Ensure occasionally.  I think the hypokalemia is secondary to poor appetite.  Given severe hypokalemia and  patient has hard time tolerating the p.o. potassium, this point patient will be admitted.  QTc is 470.   Problems Addressed: Hypokalemia: acute illness or injury Hypomagnesemia: acute illness or injury Rib pain on right side: acute illness or injury  Amount and/or Complexity of Data Reviewed Labs: ordered. Decision-making details documented in ED Course. Radiology: ordered and independent interpretation performed. Decision-making details documented in ED Course. ECG/medicine tests: ordered and independent interpretation performed. Decision-making details documented in ED Course.  Risk OTC drugs. Prescription drug management. Decision regarding hospitalization.    Final Clinical Impression(s) / ED Diagnoses Final diagnoses:  Hypokalemia  Hypomagnesemia    Rx / DC Orders ED Discharge Orders     None         Charlynne Pander, MD 07/06/23 2228

## 2023-07-06 NOTE — ED Triage Notes (Signed)
 Pt arrived from home via EMS w/ c/o of R inter coastal rib pain x 2 days. Denies any falls or trauma to area. A&O x4 during triage and denies pain at this time.

## 2023-07-06 NOTE — H&P (Signed)
 History and Physical    Alison Carpenter ZDG:[id number] DOB: April 23, 1918 DOA: 07/06/2023  Patient coming from: Home.  Chief Complaint: Right-sided chest pain.  HPI: Alison Carpenter is a 89 y.o. female with history of GERD prior history of ascending cholangitis in 2022 when patient had ERCP started to experience right-sided chest pain since this morning.  Denies any shortness of breath productive cough fever chills or any trauma.  Since the pain was persistent patient was brought to the ER.  As per the patient's grandson who lives with the patient, patient has not been ambulatory as usual and has been having poor oral intake for the last 3 weeks.  ED Course: In the ER chest x-ray does not show anything acute.  Patient is afebrile.  Labs show total bilirubin of 1.2 AST ALT normal.  WBC count of 8.8 CK 36 and troponin 16 EKG shows normal sinus rhythm.  Potassium was 2.5 and magnesium 1.  Patient was started on magnesium and potassium replacement admitted for further observation.  Review of Systems: As per HPI, rest all negative.   Past Medical History:  Diagnosis Date   Complication of anesthesia    PONV    Past Surgical History:  Procedure Laterality Date   ABDOMINAL HYSTERECTOMY     APPENDECTOMY     BILIARY DILATION  01/31/2021   Procedure: BILIARY DILATION;  Surgeon: Meridee Score Netty Starring., MD;  Location: [facility];  Service: Gastroenterology;;   BILIARY STENT PLACEMENT N/A 12/07/2020   Procedure: BILIARY STENT PLACEMENT;  Surgeon: Lynann Bologna, MD;  Location: [facility];  Service: Endoscopy;  Laterality: N/A;   CATARACT EXTRACTION, BILATERAL     CHOLECYSTECTOMY     ENDOSCOPIC RETROGRADE CHOLANGIOPANCREATOGRAPHY (ERCP) WITH PROPOFOL N/A 01/31/2021   Procedure: ENDOSCOPIC RETROGRADE CHOLANGIOPANCREATOGRAPHY (ERCP) WITH PROPOFOL;  Surgeon: Meridee Score Netty Starring., MD;  Location: [facility];  Service: Gastroenterology;  Laterality: N/A;   ERCP N/A 12/07/2020    Procedure: ENDOSCOPIC RETROGRADE CHOLANGIOPANCREATOGRAPHY (ERCP);  Surgeon: Lynann Bologna, MD;  Location: [facility];  Service: Endoscopy;  Laterality: N/A;   ERCP     FEMUR IM NAIL Left 06/10/2020   Procedure: INTRAMEDULLARY (IM) NAIL FEMORAL;  Surgeon: Eldred Manges, MD;  Location: [facility];  Service: Orthopedics;  Laterality: Left;   FOREIGN BODY REMOVAL  01/31/2021   Procedure: FOREIGN BODY REMOVAL;  Surgeon: Meridee Score Netty Starring., MD;  Location: [facility];  Service: Gastroenterology;;   REMOVAL OF STONES  01/31/2021   Procedure: REMOVAL OF STONES;  Surgeon: Lemar Lofty., MD;  Location: [facility];  Service: Gastroenterology;;   Dennison Mascot  01/31/2021   Procedure: Dennison Mascot;  Surgeon: Lemar Lofty., MD;  Location: [facility];  Service: Gastroenterology;;   Burman Freestone CHOLANGIOSCOPY N/A 01/31/2021   Procedure: VZDGLOVF CHOLANGIOSCOPY;  Surgeon: Lemar Lofty., MD;  Location: [facility];  Service: Gastroenterology;  Laterality: N/A;   STENT REMOVAL  01/31/2021   Procedure: STENT REMOVAL x2;  Surgeon: Meridee Score Netty Starring., MD;  Location: [facility];  Service: Gastroenterology;;     reports that she has never smoked. She has never used smokeless tobacco. She reports that she does not drink alcohol and does not use drugs.  Allergies  Allergen Reactions   Red Dye #40 (Allura Red) Anaphylaxis    Family History  Problem Relation Age of Onset   Colon cancer Neg Hx    Esophageal cancer Neg Hx    Inflammatory bowel disease Neg Hx    Liver disease Neg Hx    Pancreatic cancer Neg Hx  Rectal cancer Neg Hx    Stomach cancer Neg Hx     Prior to Admission medications   Medication Sig Start Date End Date Taking? Authorizing Provider  acetaminophen (TYLENOL) 500 MG tablet Take 1,000 mg by mouth every 6 (six) hours as needed for mild pain.   Yes [provider]  cholecalciferol (VITAMIN D3) 25 MCG (1000 UNIT) tablet Take 1,000 Units  by mouth daily.   Yes [provider]  loratadine (CLARITIN) 10 MG tablet Take 10 mg by mouth daily as needed for allergies.   Yes [provider]  omeprazole (PRILOSEC OTC) 20 MG tablet Take 1 tablet (20 mg total) by mouth 2 (two) times daily. 40 mg for 1 month and then 20 mg for 1 month then stop prescription. 01/31/21 01/31/22  Mansouraty, Netty Starring., MD    Physical Exam: Constitutional: Moderately built and nourished. Vitals:   07/06/23 1938  BP: (!) 147/91  Pulse: 89  Resp: 17  Temp: 98.2 F (36.8 C)  TempSrc: Oral  SpO2: 98%   Eyes: Anicteric no pallor. ENMT: No discharge from the ears eyes nose or mouth. Neck: No mass felt.  No neck rigidity. Respiratory: No rhonchi or crepitations. Cardiovascular: S1-S2 heard. Abdomen: Soft nontender bowel sound present. Musculoskeletal: Mild tenderness in the right side of the chest. Skin: No rash.  Chronic skin changes on the chest. Neurologic: Alert awake oriented to time place and person.  Moves all extremities.  Patient is hard of hearing. Psychiatric: Alert awake oriented time place and person.   Labs on Admission: I have personally reviewed following labs and imaging studies  CBC: Recent Labs  Lab 07/06/23 2023  WBC 8.8  NEUTROABS 5.8  HGB 12.5  HCT 37.3  MCV 94.7  PLT 242   Basic Metabolic Panel: Recent Labs  Lab 07/06/23 2023  NA 137  K 2.5*  CL 96*  CO2 30  GLUCOSE 93  BUN 21  CREATININE 0.57  CALCIUM 8.5*  MG 1.0*   GFR: CrCl cannot be calculated (Unknown ideal weight.). Liver Function Tests: Recent Labs  Lab 07/06/23 2023  AST 25  ALT 21  ALKPHOS 84  BILITOT 1.2  PROT 6.2*  ALBUMIN 3.3*   No results for input(s): "LIPASE", "AMYLASE" in the last 168 hours. No results for input(s): "AMMONIA" in the last 168 hours. Coagulation Profile: No results for input(s): "INR", "PROTIME" in the last 168 hours. Cardiac Enzymes: Recent Labs  Lab 07/06/23 2023  CKTOTAL 36*   BNP  (last 3 results) No results for input(s): "PROBNP" in the last 8760 hours. HbA1C: No results for input(s): "HGBA1C" in the last 72 hours. CBG: No results for input(s): "GLUCAP" in the last 168 hours. Lipid Profile: No results for input(s): "CHOL", "HDL", "LDLCALC", "TRIG", "CHOLHDL", "LDLDIRECT" in the last 72 hours. Thyroid Function Tests: No results for input(s): "TSH", "T4TOTAL", "FREET4", "T3FREE", "THYROIDAB" in the last 72 hours. Anemia Panel: No results for input(s): "VITAMINB12", "FOLATE", "FERRITIN", "TIBC", "IRON", "RETICCTPCT" in the last 72 hours. Urine analysis:    Component Value Date/Time   COLORURINE YELLOW 12/06/2020 1551   APPEARANCEUR CLEAR 12/06/2020 1551   LABSPEC 1.009 12/06/2020 1551   PHURINE 8.0 12/06/2020 1551   GLUCOSEU NEGATIVE 12/06/2020 1551   HGBUR SMALL (A) 12/06/2020 1551   BILIRUBINUR NEGATIVE 12/06/2020 1551   KETONESUR NEGATIVE 12/06/2020 1551   PROTEINUR NEGATIVE 12/06/2020 1551   NITRITE NEGATIVE 12/06/2020 1551   LEUKOCYTESUR NEGATIVE 12/06/2020 1551   Sepsis Labs: @LABRCNTIP (procalcitonin:4,lacticidven:4) )No results found for this  or any previous visit (from the past 240 hours).   Radiological Exams on Admission: DG Chest 2 View Result Date: 07/06/2023 CLINICAL DATA:  Chest pain EXAM: CHEST - 2 VIEW COMPARISON:  12/06/2020 FINDINGS: Cardiomegaly. Aortic atherosclerosis. No confluent opacity, effusion or edema. No acute bony abnormality. IMPRESSION: Cardiomegaly.  No active disease. Electronically Signed   By: Charlett Nose M.D.   On: 07/06/2023 21:23    EKG: Independently reviewed.  Normal sinus rhythm.  Assessment/Plan Principal Problem:   Hypokalemia Active Problems:   Chest pain    Right-sided chest pain cause not clear.  Mildly tender on palpation likely musculoskeletal.  I have ordered Tylenol for pain.  However since patient is having persistent pain we will get CT angiogram of chest. Severe hypokalemia and hypomagnesemia  likely from poor oral intake.  Patient's grandson who is at the bedside states that patient has been having poor appetite for the last 3 weeks.  Replace and recheck magnesium and potassium. History of GERD on PPI.   Since patient has persistent right-sided chest pain with severe electrolyte deficiencies will need close monitoring further management and more than 2 midnight stay.   DVT prophylaxis: Heparin. Code Status: Full code. Family Communication: Patient's grandson. Disposition Plan: Medical floor. Consults called: None. Admission status: Observation.

## 2023-07-07 DIAGNOSIS — K219 Gastro-esophageal reflux disease without esophagitis: Secondary | ICD-10-CM | POA: Insufficient documentation

## 2023-07-07 DIAGNOSIS — E876 Hypokalemia: Secondary | ICD-10-CM | POA: Diagnosis not present

## 2023-07-07 LAB — CBC
HCT: 36.9 % (ref 36.0–46.0)
Hemoglobin: 11.7 g/dL — ABNORMAL LOW (ref 12.0–15.0)
MCH: 31.4 pg (ref 26.0–34.0)
MCHC: 31.7 g/dL (ref 30.0–36.0)
MCV: 98.9 fL (ref 80.0–100.0)
Platelets: 237 10*3/uL (ref 150–400)
RBC: 3.73 MIL/uL — ABNORMAL LOW (ref 3.87–5.11)
RDW: 13.6 % (ref 11.5–15.5)
WBC: 8.3 10*3/uL (ref 4.0–10.5)
nRBC: 0 % (ref 0.0–0.2)

## 2023-07-07 LAB — COMPREHENSIVE METABOLIC PANEL
ALT: 17 U/L (ref 0–44)
AST: 21 U/L (ref 15–41)
Albumin: 3 g/dL — ABNORMAL LOW (ref 3.5–5.0)
Alkaline Phosphatase: 76 U/L (ref 38–126)
Anion gap: 8 (ref 5–15)
BUN: 14 mg/dL (ref 8–23)
CO2: 27 mmol/L (ref 22–32)
Calcium: 8.2 mg/dL — ABNORMAL LOW (ref 8.9–10.3)
Chloride: 103 mmol/L (ref 98–111)
Creatinine, Ser: 0.41 mg/dL — ABNORMAL LOW (ref 0.44–1.00)
GFR, Estimated: >60 mL/min (ref >60)
Glucose, Bld: 90 mg/dL (ref 70–99)
Potassium: 4.3 mmol/L (ref 3.5–5.1)
Sodium: 138 mmol/L (ref 135–145)
Total Bilirubin: 1.3 mg/dL — ABNORMAL HIGH (ref 0.0–1.2)
Total Protein: 5.6 g/dL — ABNORMAL LOW (ref 6.5–8.1)

## 2023-07-07 LAB — MAGNESIUM: Magnesium: 1.4 mg/dL — ABNORMAL LOW (ref 1.7–2.4)

## 2023-07-07 MED ORDER — HALOPERIDOL LACTATE 5 MG/ML IJ SOLN
0.2500 mg | Freq: Once | INTRAMUSCULAR | Status: AC
Start: 2023-07-07 — End: 2023-07-07
  Administered 2023-07-07: 0.25 mg via INTRAVENOUS
  Filled 2023-07-07: qty 1

## 2023-07-07 MED ORDER — LORAZEPAM 2 MG/ML IJ SOLN
0.2500 mg | Freq: Once | INTRAMUSCULAR | Status: AC
  Administered 2023-07-07: 0.25 mg via INTRAVENOUS
  Filled 2023-07-07: qty 1

## 2023-07-07 MED ORDER — CARMEX CLASSIC LIP BALM EX OINT
TOPICAL_OINTMENT | CUTANEOUS | Status: DC | PRN
  Filled 2023-07-07: qty 20

## 2023-07-07 MED ORDER — ORAL CARE MOUTH RINSE: 15.0000 mL | OROMUCOSAL | Status: DC | PRN

## 2023-07-07 NOTE — Care Management Obs Status (Signed)
 MEDICARE OBSERVATION STATUS NOTIFICATION   Patient Details  Name: Alison Carpenter MRN: [id number] Date of Birth: Jun 04, 1917   Medicare Observation Status Notification Given:  Yes    Adrian Prows, RN 07/07/2023, 12:37 PM

## 2023-07-07 NOTE — Plan of Care (Signed)

## 2023-07-07 NOTE — TOC Initial Note (Addendum)
 Transition of Care [facility]) - Initial/Assessment Note    Patient Details  Name: Alison Carpenter MRN: [id number] Date of Birth: 02/24/1918  [facility] CM/SW Contact:    Adrian Prows, RN Phone Number: 07/07/2023, 12:49 PM  Clinical Narrative:                 Pt oriented x 1 to self; called her grandson Ilda Basset [phone]); he says family stays at home w/ pt; he plans for her to return at d/c; he says family will provide transportation; Mr Jewel Baize verified Insurance/PCP; he denies pt experiencing SDOH risks; he says pt has walker, and wheelchair; he says pt does not have HH services or home oxygen; also this RN, CM verbally explained MOON to pt's grandson; telephonically; he verbalized understanding; copy of MOON left on window seat of pt's room; TOC is following.  Expected Discharge Plan: Home/Self Care Barriers to Discharge: Continued Medical Work up   Patient Goals and CMS Choice Patient states their goals for this hospitalization and ongoing recovery are:: home CMS Medicare.gov Compare Post Acute Care list provided to:: Patient Represenative (must comment) (Cla Jewel Baize (grandson))   [place] ownership interest in [facility].provided to:: Adult Children    Expected Discharge Plan and Services   Discharge Planning Services: CM Consult   Living arrangements for the past 2 months: Single Family Home                                      Prior Living Arrangements/Services Living arrangements for the past 2 months: Single Family Home Lives with:: Adult Children Patient language and need for interpreter reviewed:: Yes Do you feel safe going back to the place where you live?: Yes      Need for Family Participation in Patient Care: Yes (Comment) Care giver support system in place?: Yes (comment) Current home services: DME (walker, wheelchair) Criminal Activity/Legal Involvement Pertinent to Current Situation/Hospitalization: No -  Comment as needed  Activities of Daily Living   ADL Screening (condition at time of admission) Independently performs ADLs?: Yes (appropriate for developmental age) Is the patient deaf or have difficulty hearing?: No Does the patient have difficulty seeing, even when wearing glasses/contacts?: No Does the patient have difficulty concentrating, remembering, or making decisions?: No  Permission Sought/Granted Permission sought to share information with : Case Manager Permission granted to share information with : Yes, Verbal Permission Granted  Share Information with NAME: Case Manager     Permission granted to share info w Relationship: Ilda Basset (grandson) [phone]     Emotional Assessment Appearance:: Appears stated age Attitude/Demeanor/Rapport: Unable to Assess (unable to assess) Affect (typically observed): Unable to Assess Orientation: :  (unable to assess) Alcohol / Substance Use: Not Applicable Psych Involvement: No (comment)  Admission diagnosis:  Hypokalemia [E87.6] Hypomagnesemia [E83.42] Rib pain on right side [R07.81] Chest pain [R07.9] Patient Active Problem List   Diagnosis Date Noted   Hypomagnesemia 07/07/2023   GERD (gastroesophageal reflux disease) 07/07/2023   Hypokalemia 07/06/2023   Chest pain 07/06/2023   Choledocholithiasis 01/28/2021   History of biliary duct stent placement 01/28/2021   Ascending cholangitis 01/28/2021   History of ERCP 01/28/2021   Abnormal LFTs 01/28/2021   Postoperative nausea and vomiting 01/28/2021   Pressure injury of skin 12/07/2020   Biliary obstruction 12/06/2020   Iron deficiency anemia 06/16/2020   Underweight 06/16/2020   Physical deconditioning 06/16/2020  Closed nondisplaced intertrochanteric fracture of left femur (HCC)    PCP:  Burton Apley, MD Pharmacy:   [facility] [phone] - [place], [place] - [address] AT [address] OF [address] [place] [address] Phone:  [phone] Fax: [phone]     Social Drivers of Health (SDOH) Social History: SDOH Screenings   Food Insecurity: No Food Insecurity (07/07/2023)  Housing: Low Risk  (07/07/2023)  Transportation Needs: No Transportation Needs (07/07/2023)  Utilities: Not At Risk (07/07/2023)  Social Connections: Unknown (07/07/2023)  Tobacco Use: Low Risk  (07/06/2023)   SDOH Interventions: Food Insecurity Interventions: Intervention Not Indicated, Inpatient TOC Housing Interventions: Intervention Not Indicated, Inpatient TOC Transportation Interventions: Intervention Not Indicated, Inpatient TOC Utilities Interventions: Intervention Not Indicated, Inpatient TOC Social Connections Interventions: Intervention Not Indicated   Readmission Risk Interventions     No data to display

## 2023-07-07 NOTE — Discharge Summary (Signed)
 Physician Discharge Summary  Alison Carpenter [id number]:[id number] DOB: Jan 26, 1918 DOA: 07/06/2023  PCP: Burton Apley, MD  Admit date: 07/06/2023 Discharge date: 07/07/2023  Admitted From: Home Disposition: Home  Recommendations for Outpatient Follow-up:  Follow up with PCP in 1-2 weeks Discussed updating document including living will, MOST form, DNR  Home Health: None Equipment/Devices: None  Discharge Condition: Guarded CODE STATUS: Full Diet recommendation: As tolerated  Brief/Interim Summary: Alison Carpenter is a 89 y.o. female with history of GERD prior history of ascending cholangitis in 2022 when patient had ERCP started to experience right-sided chest pain (family notes patient points to her abdomen which she states "chest pain").  Patient admitted as above with complaints over chest pain with notably poor p.o. intake over the past month to 2 months per family at bedside.  Patient's mental status is somewhat diminished this morning from baseline in the setting of recent benzodiazepine dose for agitation.  She is otherwise stable, appears to be improving her p.o. tolerance and diet somewhat over the past 24 hours.  Presumed musculoskeletal chest pain in the setting of reproducible pleuritic chest pain on palpation or deep inspiration.  CTA chest was negative for any filling defects.  Given negative imaging, resolving clinical status patient otherwise stable for discharge home.  Family at bedside present including daughter and grandson with lengthy discussion at bedside in regards to patient's goals of care, advanced age and improving symptoms.  Discussed returning home with close follow-up by PCP to update her medical/legal documents including but not limited to living well, MOST form, DNR.  Discharge Diagnoses:  Principal Problem:   Hypokalemia Active Problems:   Chest pain   Hypomagnesemia   GERD (gastroesophageal reflux disease)    Discharge  Instructions   Allergies as of 07/07/2023       Reactions   Red Dye #40 (allura Red) Anaphylaxis        Medication List     TAKE these medications    acetaminophen 500 MG tablet Commonly known as: TYLENOL Take 1,000 mg by mouth every 6 (six) hours as needed for mild pain.   cholecalciferol 25 MCG (1000 UNIT) tablet Commonly known as: VITAMIN D3 Take 1,000 Units by mouth daily.   loratadine 10 MG tablet Commonly known as: CLARITIN Take 10 mg by mouth daily as needed for allergies.   omeprazole 20 MG tablet Commonly known as: PriLOSEC OTC Take 1 tablet (20 mg total) by mouth 2 (two) times daily. 40 mg for 1 month and then 20 mg for 1 month then stop prescription.        Allergies  Allergen Reactions   Red Dye #40 (Allura Red) Anaphylaxis    Consultations: None  Procedures/Studies: CT Angio Chest Pulmonary Embolism (PE) W or WO Contrast Result Date: 07/07/2023 CLINICAL DATA:  Pulmonary embolism suspected EXAM: CT ANGIOGRAPHY CHEST WITH CONTRAST TECHNIQUE: Multidetector CT imaging of the chest was performed using the standard protocol during bolus administration of intravenous contrast. Multiplanar CT image reconstructions and MIPs were obtained to evaluate the vascular anatomy. RADIATION DOSE REDUCTION: This exam was performed according to the departmental dose-optimization program which includes automated exposure control, adjustment of the mA and/or kV according to patient size and/or use of iterative reconstruction technique. CONTRAST:  75mL OMNIPAQUE IOHEXOL 350 MG/ML SOLN COMPARISON:  None Available. FINDINGS: Cardiovascular: Contrast injection is sufficient to demonstrate satisfactory opacification of the pulmonary arteries to the segmental level. There is no pulmonary embolus or evidence of right heart strain. The size of the  main pulmonary artery is normal. Heart size is normal, with no pericardial effusion. The course and caliber of the aorta are normal. There is  atherosclerotic calcification. Opacification decreased due to pulmonary arterial phase contrast bolus timing. Mediastinum/Nodes: No mediastinal, hilar or axillary lymphadenopathy. Normal visualized thyroid. Thoracic esophageal course is normal. Lungs/Pleura: 7 mm right middle lobe pulmonary nodule (3:79). No pleural effusion or pneumothorax. No focal airspace consolidation Upper Abdomen: Contrast bolus timing is not optimized for evaluation of the abdominal organs. The visualized portions of the organs of the upper abdomen are normal. Musculoskeletal: T11 wedge compression fracture, age indeterminate but suspected to be subacute to chronic. Review of the MIP images confirms the above findings. IMPRESSION: 1. No pulmonary embolus or other acute intrathoracic abnormality. 2. T11 wedge compression fracture, age indeterminate but suspected to be subacute to chronic. 3. Right solid pulmonary nodule measuring 7 mm. Per Fleischner Society Guidelines, recommend a non-contrast Chest CT at 6-12 months. If patient is high risk for malignancy, consider an additional non-contrast Chest CT at 18-24 months. If patient is low risk for malignancy, non-contrast Chest CT at 18-24 months is optional. These guidelines do not apply to immunocompromised patients and patients with cancer. Follow up in patients with significant comorbidities as clinically warranted. For lung cancer screening, adhere to Lung-RADS guidelines. Reference: Radiology. 2017; 284(1):228-43. Electronically Signed   By: Deatra Robinson M.D.   On: 07/07/2023 00:02   DG Chest 2 View Result Date: 07/06/2023 CLINICAL DATA:  Chest pain EXAM: CHEST - 2 VIEW COMPARISON:  12/06/2020 FINDINGS: Cardiomegaly. Aortic atherosclerosis. No confluent opacity, effusion or edema. No acute bony abnormality. IMPRESSION: Cardiomegaly.  No active disease. Electronically Signed   By: Charlett Nose M.D.   On: 07/06/2023 21:23     Subjective: No acute issues or events  overnight   Discharge Exam: Vitals:   07/07/23 1212 07/07/23 1214  BP: (!) 152/82   Pulse: 86 82  Resp: 20   Temp: (!) 97.5 F (36.4 C) (!) 97.5 F (36.4 C)  SpO2: 100% 100%   Vitals:   07/07/23 0419 07/07/23 0809 07/07/23 1212 07/07/23 1214  BP: (!) 136/97 119/71 (!) 152/82   Pulse: 97 78 86 82  Resp: 18  20   Temp: 97.6 F (36.4 C) 97.9 F (36.6 C) (!) 97.5 F (36.4 C) (!) 97.5 F (36.4 C)  TempSrc:   Oral   SpO2: 97% 99% 100% 100%  Weight:      Height:        General: Pt is alert, awake, not in acute distress Cardiovascular: RRR, S1/S2 +, no rubs, no gallops Respiratory: CTA bilaterally, no wheezing, no rhonchi Abdominal: Soft, NT, ND, bowel sounds + Extremities: no edema, no cyanosis    The results of significant diagnostics from this hospitalization (including imaging, microbiology, ancillary and laboratory) are listed below for reference.     Microbiology: No results found for this or any previous visit (from the past 240 hours).   Labs: BNP (last 3 results) No results for input(s): "BNP" in the last 8760 hours. Basic Metabolic Panel: Recent Labs  Lab 07/06/23 2023 07/07/23 1049  NA 137 138  K 2.5* 4.3  CL 96* 103  CO2 30 27  GLUCOSE 93 90  BUN 21 14  CREATININE 0.57 0.41*  CALCIUM 8.5* 8.2*  MG 1.0* 1.4*   Liver Function Tests: Recent Labs  Lab 07/06/23 2023 07/07/23 1049  AST 25 21  ALT 21 17  ALKPHOS 84 76  BILITOT 1.2 1.3*  PROT 6.2* 5.6*  ALBUMIN 3.3* 3.0*   No results for input(s): "LIPASE", "AMYLASE" in the last 168 hours. No results for input(s): "AMMONIA" in the last 168 hours. CBC: Recent Labs  Lab 07/06/23 2023 07/07/23 1049  WBC 8.8 8.3  NEUTROABS 5.8  --   HGB 12.5 11.7*  HCT 37.3 36.9  MCV 94.7 98.9  PLT 242 237   Cardiac Enzymes: Recent Labs  Lab 07/06/23 2023  CKTOTAL 36*   BNP: Invalid input(s): "POCBNP" CBG: No results for input(s): "GLUCAP" in the last 168 hours. D-Dimer No results for  input(s): "DDIMER" in the last 72 hours. Hgb A1c No results for input(s): "HGBA1C" in the last 72 hours. Lipid Profile No results for input(s): "CHOL", "HDL", "LDLCALC", "TRIG", "CHOLHDL", "LDLDIRECT" in the last 72 hours. Thyroid function studies No results for input(s): "TSH", "T4TOTAL", "T3FREE", "THYROIDAB" in the last 72 hours.  Invalid input(s): "FREET3" Anemia work up No results for input(s): "VITAMINB12", "FOLATE", "FERRITIN", "TIBC", "IRON", "RETICCTPCT" in the last 72 hours. Urinalysis    Component Value Date/Time   COLORURINE YELLOW 12/06/2020 1551   APPEARANCEUR CLEAR 12/06/2020 1551   LABSPEC 1.009 12/06/2020 1551   PHURINE 8.0 12/06/2020 1551   GLUCOSEU NEGATIVE 12/06/2020 1551   HGBUR SMALL (A) 12/06/2020 1551   BILIRUBINUR NEGATIVE 12/06/2020 1551   KETONESUR NEGATIVE 12/06/2020 1551   PROTEINUR NEGATIVE 12/06/2020 1551   NITRITE NEGATIVE 12/06/2020 1551   LEUKOCYTESUR NEGATIVE 12/06/2020 1551   Sepsis Labs Recent Labs  Lab 07/06/23 2023 07/07/23 1049  WBC 8.8 8.3   Microbiology No results found for this or any previous visit (from the past 240 hours).   Time coordinating discharge: Over 30 minutes  SIGNED:   Azucena Fallen, DO [facility] 07/07/2023, 3:08 PM Pager   If 7PM-7AM, please contact night-coverage [email or web address]

## 2023-07-07 NOTE — TOC Transition Note (Signed)
 Transition of Care [facility]) - Discharge Note   Patient Details  Name: Alison Carpenter MRN: [id number] Date of Birth: [birth year]  [facility]) CM/SW Contact:  Adrian Prows, RN Phone Number: 07/07/2023, 3:27 PM   Clinical Narrative:    D/C orders received; no TOC needs.   Final next level of care: Home/Self Care Barriers to Discharge: No Barriers Identified   Patient Goals and CMS Choice Patient states their goals for this hospitalization and ongoing recovery are:: home CMS Medicare.gov Compare Post Acute Care list provided to:: Patient Represenative (must comment) (Cla Jewel Baize (grandson))   [place] ownership interest in [facility].provided to:: Adult Children    Discharge Placement                       Discharge Plan and Services Additional resources added to the After Visit Summary for     Discharge Planning Services: CM Consult                                 Social Drivers of Health (SDOH) Interventions SDOH Screenings   Food Insecurity: No Food Insecurity (07/07/2023)  Housing: Low Risk  (07/07/2023)  Transportation Needs: No Transportation Needs (07/07/2023)  Utilities: Not At Risk (07/07/2023)  Social Connections: Unknown (07/07/2023)  Tobacco Use: Low Risk  (07/06/2023)     Readmission Risk Interventions     No data to display

## 2023-08-08 ENCOUNTER — Other Ambulatory Visit: Payer: Self-pay

## 2023-08-08 ENCOUNTER — Emergency Department (HOSPITAL_COMMUNITY)
Admission: EM | Admit: 2023-08-08 | Discharge: 2023-08-08 | Disposition: A | Discharge status: Home / Self Care | Attending: Emergency Medicine | Admitting: Emergency Medicine

## 2023-08-08 ENCOUNTER — Encounter (HOSPITAL_COMMUNITY): Payer: Self-pay

## 2023-08-08 DIAGNOSIS — E860 Dehydration: Secondary | ICD-10-CM | POA: Diagnosis not present

## 2023-08-08 DIAGNOSIS — R109 Unspecified abdominal pain: Secondary | ICD-10-CM | POA: Diagnosis not present

## 2023-08-08 DIAGNOSIS — R110 Nausea: Secondary | ICD-10-CM | POA: Diagnosis present

## 2023-08-08 LAB — URINALYSIS, ROUTINE W REFLEX MICROSCOPIC
Bacteria, UA: NONE SEEN
Bilirubin Urine: NEGATIVE
Glucose, UA: NEGATIVE mg/dL
Hgb urine dipstick: NEGATIVE
Ketones, ur: NEGATIVE mg/dL
Nitrite: NEGATIVE
Protein, ur: NEGATIVE mg/dL
Specific Gravity, Urine: 1.013 (ref 1.005–1.030)
pH: 6 (ref 5.0–8.0)

## 2023-08-08 LAB — COMPREHENSIVE METABOLIC PANEL WITH GFR
ALT: 40 U/L (ref 0–44)
AST: 38 U/L (ref 15–41)
Albumin: 3.9 g/dL (ref 3.5–5.0)
Alkaline Phosphatase: 83 U/L (ref 38–126)
Anion gap: 8 (ref 5–15)
BUN: 34 mg/dL — ABNORMAL HIGH (ref 8–23)
CO2: 30 mmol/L (ref 22–32)
Calcium: 9.3 mg/dL (ref 8.9–10.3)
Chloride: 96 mmol/L — ABNORMAL LOW (ref 98–111)
Creatinine, Ser: 0.53 mg/dL (ref 0.44–1.00)
GFR, Estimated: >60 mL/min (ref >60)
Glucose, Bld: 100 mg/dL — ABNORMAL HIGH (ref 70–99)
Potassium: 5.1 mmol/L (ref 3.5–5.1)
Sodium: 134 mmol/L — ABNORMAL LOW (ref 135–145)
Total Bilirubin: 0.8 mg/dL (ref 0.0–1.2)
Total Protein: 6.9 g/dL (ref 6.5–8.1)

## 2023-08-08 LAB — CBC WITH DIFFERENTIAL/PLATELET
Abs Immature Granulocytes: 0.01 10*3/uL (ref 0.00–0.07)
Basophils Absolute: 0.1 10*3/uL (ref 0.0–0.1)
Basophils Relative: 1 %
Eosinophils Absolute: 0.1 10*3/uL (ref 0.0–0.5)
Eosinophils Relative: 1 %
HCT: 40.7 % (ref 36.0–46.0)
Hemoglobin: 13.5 g/dL (ref 12.0–15.0)
Immature Granulocytes: 0 %
Lymphocytes Relative: 18 %
Lymphs Abs: 1.4 10*3/uL (ref 0.7–4.0)
MCH: 33.1 pg (ref 26.0–34.0)
MCHC: 33.2 g/dL (ref 30.0–36.0)
MCV: 99.8 fL (ref 80.0–100.0)
Monocytes Absolute: 0.6 10*3/uL (ref 0.1–1.0)
Monocytes Relative: 8 %
Neutro Abs: 5.7 10*3/uL (ref 1.7–7.7)
Neutrophils Relative %: 72 %
Platelets: 235 10*3/uL (ref 150–400)
RBC: 4.08 MIL/uL (ref 3.87–5.11)
RDW: 14.3 % (ref 11.5–15.5)
WBC: 7.9 10*3/uL (ref 4.0–10.5)
nRBC: 0 % (ref 0.0–0.2)

## 2023-08-08 LAB — LIPASE, BLOOD: Lipase: 46 U/L (ref 11–51)

## 2023-08-08 LAB — MAGNESIUM: Magnesium: 1.8 mg/dL (ref 1.7–2.4)

## 2023-08-08 MED ORDER — MAGNESIUM SULFATE 2 GM/50ML IV SOLN
2.0000 g | Freq: Once | INTRAVENOUS | Status: AC
  Administered 2023-08-08: 2 g via INTRAVENOUS
  Filled 2023-08-08: qty 50

## 2023-08-08 MED ORDER — SODIUM CHLORIDE 0.9 % IV BOLUS
1000.0000 mL | Freq: Once | INTRAVENOUS | Status: AC
  Administered 2023-08-08: 1000 mL via INTRAVENOUS

## 2023-08-08 NOTE — ED Notes (Signed)
 Lab called and urine culture added on.

## 2023-08-08 NOTE — ED Provider Notes (Signed)
 [place] EMERGENCY DEPARTMENT AT [facility] Provider Note   CSN: [id number] Arrival date & time: 08/08/23  1447     History  Chief Complaint  Patient presents with   Nausea    Alison Carpenter is a 89 y.o. female.  With a history of iron deficiency anemia, hypokalemia and hypomagnesemia who presents to the ED for concern of dehydration.  Patient lives at home with family and 24-hour care in place.  Family members at bedside voiced concern for decreased p.o. intake.  She was recently admitted on March 14 for hypokalemia and hypomagnesemia.  Family has been trying to push increase p.o. intake since she has been home but she has not eaten much over the last several days aside from small amount of Ensure drink.  The patient has been reporting nausea and abdominal pain to her family at home.  No vomiting or diarrhea.  Family called PCP office who directed her here for further evaluation given concern for dehydration.  Patient denies chest pain shortness of breath at this time.  HPI     Home Medications Prior to Admission medications   Medication Sig Start Date End Date Taking? Authorizing Provider  acetaminophen (TYLENOL) 500 MG tablet Take 1,000 mg by mouth every 6 (six) hours as needed for mild pain.    [provider]  cholecalciferol (VITAMIN D3) 25 MCG (1000 UNIT) tablet Take 1,000 Units by mouth daily.    [provider]  loratadine (CLARITIN) 10 MG tablet Take 10 mg by mouth daily as needed for allergies.    [provider]  omeprazole (PRILOSEC OTC) 20 MG tablet Take 1 tablet (20 mg total) by mouth 2 (two) times daily. 40 mg for 1 month and then 20 mg for 1 month then stop prescription. 01/31/21 01/31/22  Mansouraty, Netty Starring., MD      Allergies    Red dye #40 (allura red)    Review of Systems   Review of Systems  Physical Exam Updated Vital Signs BP (!) 141/84   Pulse 90   Temp 97.8 F (36.6 C) (Oral)   Resp 20   Ht 5\' 1"   (1.549 m)   Wt 37.2 kg   SpO2 98%   BMI 15.50 kg/m  Physical Exam Vitals and nursing note reviewed.  Constitutional:      Comments: Frail  HENT:     Head: Normocephalic and atraumatic.  Eyes:     Pupils: Pupils are equal, round, and reactive to light.  Cardiovascular:     Rate and Rhythm: Normal rate and regular rhythm.  Pulmonary:     Effort: Pulmonary effort is normal.     Breath sounds: Normal breath sounds.  Abdominal:     Palpations: Abdomen is soft.     Tenderness: There is no abdominal tenderness.  Skin:    General: Skin is warm and dry.  Neurological:     Mental Status: She is alert and oriented to person, place, and time.     Sensory: No sensory deficit.     Motor: No weakness.  Psychiatric:        Mood and Affect: Mood normal.     ED Results / Procedures / Treatments   Labs (all labs ordered are listed, but only abnormal results are displayed) Labs Reviewed  COMPREHENSIVE METABOLIC PANEL WITH GFR - Abnormal; Notable for the following components:      Result Value   Sodium 134 (*)    Chloride 96 (*)  Glucose, Bld 100 (*)    BUN 34 (*)    All other components within normal limits  URINALYSIS, ROUTINE W REFLEX MICROSCOPIC - Abnormal; Notable for the following components:   APPearance HAZY (*)    Leukocytes,Ua LARGE (*)    All other components within normal limits  URINE CULTURE  LIPASE, BLOOD  CBC WITH DIFFERENTIAL/PLATELET  MAGNESIUM    EKG EKG Interpretation Date/Time:  Wednesday August 08 2023 16:00:21 EDT Ventricular Rate:  94 PR Interval:  163 QRS Duration:  104 QT Interval:  356 QTC Calculation: 446 R Axis:   -47  Text Interpretation: Sinus rhythm LAD, consider left anterior fascicular block Low voltage, precordial leads Artifact in lead(s) I II III aVR aVL aVF V2 Confirmed by Rafael Bun [phone]) on 08/08/2023 4:39:28 PM  Radiology No results found.  Procedures Procedures    Medications Ordered in ED Medications  sodium  chloride 0.9 % bolus 1,000 mL (1,000 mLs Intravenous New Bag/Given 08/08/23 1616)  magnesium sulfate IVPB 2 g 50 mL (0 g Intravenous Stopped 08/08/23 1904)  sodium chloride 0.9 % bolus 1,000 mL (0 mLs Intravenous Stopped 08/08/23 1836)    ED Course/ Medical Decision Making/ A&P Clinical Course as of 08/08/23 1909  Wed Aug 08, 2023  1752 Laboratory workup reveals mild hypomagnesemia.  Will replete here and provide second liter.  Other electrolytes look okay.  Awaiting UA [MP]  1907 UA shows large leukocytes with white cells but no nitrites.  Patient does have some Cipro at home but I think she should hold off on antibiotics until we get the culture results given that she has had diarrhea from Cipro in the past.  She will follow-up with her primary care doctor regarding urine culture results and reevaluation for dehydration.  She remained stable and is appropriate for discharge at this time [MP]    Clinical Course User Index [MP] Sallyanne Creamer, DO                                 Medical Decision Making 89 year old female with history as above presenting from home given concern for decreased p.o. intake and dehydration.  Stated complaint of nausea and abdominal pain.  Recent mission for hypomagnesemia and hypokalemia last month.  Patient is awake alert fully oriented without focal neurologic deficits on my assessment.  Afebrile and well-appearing.  Slightly hypertensive.  Does appear clinically dry.  Will obtain laboratory workup including metabolic panel CBC magnesium level and provide electrolytes repletion as needed.  Will provide IV fluids for rehydration.  Will also obtain urinalysis to look for UTI.  Recently treated for UTI with Cipro 2 weeks ago.  Will continue to monitor to ascertain need for admission versus discharge home with close PCP follow-up  Amount and/or Complexity of Data Reviewed Labs: ordered.  Risk Prescription drug management.           Final Clinical  Impression(s) / ED Diagnoses Final diagnoses:  Dehydration    Rx / DC Orders ED Discharge Orders     None         Sallyanne Creamer, DO 08/08/23 1909

## 2023-08-08 NOTE — Discharge Instructions (Signed)
 Alison Carpenter was seen in the emergency department for dehydration due to poor intake We gave her 2 L of IV fluids Her magnesium was slightly low but the rest of her blood work looked okay We need to send the urine for culture to see if there is a UTI Your primary care doctor will need to follow-up on these results She should return to the emergency department if she stops eating or drinking or if you have any other concerns

## 2023-08-08 NOTE — ED Triage Notes (Signed)
 Pt arrived with grandson reporting abdominal pain, intermittently. States she has been saying "I want to vomit" for 2-3 days now, has not seen anything come up. Patient pcp advised to come to ED and said patient dehydrated.

## 2023-08-08 NOTE — ED Notes (Signed)
Lab called and Magnesium added on.

## 2023-08-09 LAB — URINE CULTURE: Culture: <10000 — AB

## 2023-09-18 ENCOUNTER — Encounter (HOSPITAL_COMMUNITY): Payer: Self-pay

## 2023-09-18 ENCOUNTER — Emergency Department (HOSPITAL_COMMUNITY)

## 2023-09-18 ENCOUNTER — Other Ambulatory Visit: Payer: Self-pay

## 2023-09-18 ENCOUNTER — Emergency Department (HOSPITAL_COMMUNITY)
Admission: EM | Admit: 2023-09-18 | Discharge: 2023-09-18 | Disposition: A | Discharge status: Home / Self Care | Attending: Emergency Medicine | Admitting: Emergency Medicine

## 2023-09-18 DIAGNOSIS — E876 Hypokalemia: Secondary | ICD-10-CM

## 2023-09-18 DIAGNOSIS — W19XXXA Unspecified fall, initial encounter: Secondary | ICD-10-CM

## 2023-09-18 DIAGNOSIS — M25551 Pain in right hip: Secondary | ICD-10-CM | POA: Insufficient documentation

## 2023-09-18 DIAGNOSIS — S0990XA Unspecified injury of head, initial encounter: Secondary | ICD-10-CM | POA: Diagnosis present

## 2023-09-18 LAB — COMPREHENSIVE METABOLIC PANEL WITH GFR
ALT: 73 U/L — ABNORMAL HIGH (ref 0–44)
AST: 51 U/L — ABNORMAL HIGH (ref 15–41)
Albumin: 3.5 g/dL (ref 3.5–5.0)
Alkaline Phosphatase: 156 U/L — ABNORMAL HIGH (ref 38–126)
Anion gap: 8 (ref 5–15)
BUN: 34 mg/dL — ABNORMAL HIGH (ref 8–23)
CO2: 29 mmol/L (ref 22–32)
Calcium: 8.9 mg/dL (ref 8.9–10.3)
Chloride: 93 mmol/L — ABNORMAL LOW (ref 98–111)
Creatinine, Ser: 0.65 mg/dL (ref 0.44–1.00)
GFR, Estimated: >60 mL/min (ref >60)
Glucose, Bld: 77 mg/dL (ref 70–99)
Potassium: 2.9 mmol/L — ABNORMAL LOW (ref 3.5–5.1)
Sodium: 130 mmol/L — ABNORMAL LOW (ref 135–145)
Total Bilirubin: 0.6 mg/dL (ref 0.0–1.2)
Total Protein: 6.5 g/dL (ref 6.5–8.1)

## 2023-09-18 LAB — CBC WITH DIFFERENTIAL/PLATELET
Abs Immature Granulocytes: 0.03 10*3/uL (ref 0.00–0.07)
Basophils Absolute: 0.1 10*3/uL (ref 0.0–0.1)
Basophils Relative: 1 %
Eosinophils Absolute: 0.2 10*3/uL (ref 0.0–0.5)
Eosinophils Relative: 3 %
HCT: 39.2 % (ref 36.0–46.0)
Hemoglobin: 12.7 g/dL (ref 12.0–15.0)
Immature Granulocytes: 0 %
Lymphocytes Relative: 23 %
Lymphs Abs: 1.8 10*3/uL (ref 0.7–4.0)
MCH: 33.1 pg (ref 26.0–34.0)
MCHC: 32.4 g/dL (ref 30.0–36.0)
MCV: 102.1 fL — ABNORMAL HIGH (ref 80.0–100.0)
Monocytes Absolute: 0.8 10*3/uL (ref 0.1–1.0)
Monocytes Relative: 10 %
Neutro Abs: 4.8 10*3/uL (ref 1.7–7.7)
Neutrophils Relative %: 63 %
Platelets: 174 10*3/uL (ref 150–400)
RBC: 3.84 MIL/uL — ABNORMAL LOW (ref 3.87–5.11)
RDW: 13.2 % (ref 11.5–15.5)
WBC: 7.7 10*3/uL (ref 4.0–10.5)
nRBC: 0 % (ref 0.0–0.2)

## 2023-09-18 MED ORDER — POTASSIUM CHLORIDE CRYS ER 20 MEQ PO TBCR
40.0000 meq | EXTENDED_RELEASE_TABLET | Freq: Once | ORAL | Status: AC
  Administered 2023-09-18: 40 meq via ORAL
  Filled 2023-09-18: qty 2

## 2023-09-18 MED ORDER — MAGNESIUM OXIDE -MG SUPPLEMENT 400 (240 MG) MG PO TABS
800.0000 mg | ORAL_TABLET | Freq: Once | ORAL | Status: AC
  Administered 2023-09-18: 800 mg via ORAL
  Filled 2023-09-18: qty 2

## 2023-09-18 MED ORDER — ACETAMINOPHEN 500 MG PO TABS
1000.0000 mg | ORAL_TABLET | Freq: Once | ORAL | Status: AC
  Administered 2023-09-18: 1000 mg via ORAL
  Filled 2023-09-18: qty 2

## 2023-09-18 NOTE — ED Provider Notes (Signed)
 [place] EMERGENCY DEPARTMENT AT [facility] Provider Note  CSN: [id number] Arrival date & time: 09/18/23 4098  Chief Complaint(s) Fall  HPI Alison Carpenter is a 89 y.o. female who presents emerged part for evaluation of a fall.  Patient states that she was attempting to get into bed and fell onto her right hip.  Endorses pain at the right hip but denies any additional traumatic complaints including chest pain, shortness of breath, abdominal pain, nausea, vomiting, numbness, tingling, weakness.    Past Medical History Past Medical History:  Diagnosis Date   Complication of anesthesia    PONV   Patient Active Problem List   Diagnosis Date Noted   Hypomagnesemia 07/07/2023   GERD (gastroesophageal reflux disease) 07/07/2023   Hypokalemia 07/06/2023   Chest pain 07/06/2023   Choledocholithiasis 01/28/2021   History of biliary duct stent placement 01/28/2021   Ascending cholangitis 01/28/2021   History of ERCP 01/28/2021   Abnormal LFTs 01/28/2021   Postoperative nausea and vomiting 01/28/2021   Pressure injury of skin 12/07/2020   Biliary obstruction 12/06/2020   Iron deficiency anemia 06/16/2020   Underweight 06/16/2020   Physical deconditioning 06/16/2020   Closed nondisplaced intertrochanteric fracture of left femur (HCC)    Home Medication(s) Prior to Admission medications   Medication Sig Start Date End Date Taking? Authorizing Provider  acetaminophen  (TYLENOL ) 500 MG tablet Take 1,000 mg by mouth every 6 (six) hours as needed for mild pain.    [provider]  cholecalciferol (VITAMIN D3) 25 MCG (1000 UNIT) tablet Take 1,000 Units by mouth daily.    [provider]  loratadine (CLARITIN) 10 MG tablet Take 10 mg by mouth daily as needed for allergies.    [provider]  omeprazole  (PRILOSEC  OTC) 20 MG tablet Take 1 tablet (20 mg total) by mouth 2 (two) times daily. 40 mg for 1 month and then 20 mg for 1 month then stop  prescription. 01/31/21 01/31/22  Mansouraty, Albino Alu., MD                                                                                                                                    Past Surgical History Past Surgical History:  Procedure Laterality Date   ABDOMINAL HYSTERECTOMY     APPENDECTOMY     BILIARY DILATION  01/31/2021   Procedure: BILIARY DILATION;  Surgeon: Brice Campi Albino Alu., MD;  Location: [facility];  Service: Gastroenterology;;   BILIARY STENT PLACEMENT N/A 12/07/2020   Procedure: BILIARY STENT PLACEMENT;  Surgeon: Lajuan Pila, MD;  Location: [facility];  Service: Endoscopy;  Laterality: N/A;   CATARACT EXTRACTION, BILATERAL     CHOLECYSTECTOMY     ENDOSCOPIC RETROGRADE CHOLANGIOPANCREATOGRAPHY (ERCP) WITH PROPOFOL  N/A 01/31/2021   Procedure: ENDOSCOPIC RETROGRADE CHOLANGIOPANCREATOGRAPHY (ERCP) WITH PROPOFOL ;  Surgeon: Brice Campi Albino Alu., MD;  Location: [facility];  Service: Gastroenterology;  Laterality: N/A;   ERCP N/A 12/07/2020   Procedure:  ENDOSCOPIC RETROGRADE CHOLANGIOPANCREATOGRAPHY (ERCP);  Surgeon: Lajuan Pila, MD;  Location: [facility];  Service: Endoscopy;  Laterality: N/A;   ERCP     FEMUR IM NAIL Left 06/10/2020   Procedure: INTRAMEDULLARY (IM) NAIL FEMORAL;  Surgeon: Adah Acron, MD;  Location: [facility];  Service: Orthopedics;  Laterality: Left;   FOREIGN BODY REMOVAL  01/31/2021   Procedure: FOREIGN BODY REMOVAL;  Surgeon: Brice Campi Albino Alu., MD;  Location: [facility];  Service: Gastroenterology;;   REMOVAL OF STONES  01/31/2021   Procedure: REMOVAL OF STONES;  Surgeon: Normie Becton., MD;  Location: [facility];  Service: Gastroenterology;;   [address]  01/31/2021   Procedure: [address];  Surgeon: Normie Becton., MD;  Location: [facility];  Service: Gastroenterology;;   Alberteen Aloe CHOLANGIOSCOPY N/A 01/31/2021   Procedure: ZOXWRUEA CHOLANGIOSCOPY;  Surgeon: Normie Becton., MD;   Location: [facility];  Service: Gastroenterology;  Laterality: N/A;   STENT REMOVAL  01/31/2021   Procedure: STENT REMOVAL x2;  Surgeon: Brice Campi Albino Alu., MD;  Location: [facility];  Service: Gastroenterology;;   Family History Family History  Problem Relation Age of Onset   Colon cancer Neg Hx    Esophageal cancer Neg Hx    Inflammatory bowel disease Neg Hx    Liver disease Neg Hx    Pancreatic cancer Neg Hx    Rectal cancer Neg Hx    Stomach cancer Neg Hx     Social History Social History   Tobacco Use   Smoking status: Never   Smokeless tobacco: Never  Vaping Use   Vaping status: Never Used  Substance Use Topics   Alcohol use: Never   Drug use: Never   Allergies Red dye #40 (allura red)  Review of Systems Review of Systems  Musculoskeletal:  Positive for arthralgias and myalgias.    Physical Exam Vital Signs  I have reviewed the triage vital signs BP 120/76 (BP Location: Right Arm)   Pulse 81   Temp (!) 97.5 F (36.4 C) (Oral)   Resp 16   Ht 5\' 1"  (1.549 m)   Wt 37 kg   SpO2 98%   BMI 15.41 kg/m   Physical Exam Vitals and nursing note reviewed.  Constitutional:      General: She is not in acute distress.    Appearance: She is well-developed.  HENT:     Head: Normocephalic and atraumatic.  Eyes:     Conjunctiva/sclera: Conjunctivae normal.  Cardiovascular:     Rate and Rhythm: Normal rate and regular rhythm.     Heart sounds: No murmur heard. Pulmonary:     Effort: Pulmonary effort is normal. No respiratory distress.     Breath sounds: Normal breath sounds.  Abdominal:     Palpations: Abdomen is soft.     Tenderness: There is no abdominal tenderness.  Musculoskeletal:        General: Tenderness present. No swelling.     Cervical back: Neck supple.  Skin:    General: Skin is warm and dry.     Capillary Refill: Capillary refill takes less than 2 seconds.  Neurological:     Mental Status: She is alert.  Psychiatric:        Mood  and Affect: Mood normal.     ED Results and Treatments Labs (all labs ordered are listed, but only abnormal results are displayed) Labs Reviewed  COMPREHENSIVE METABOLIC PANEL WITH GFR  CBC WITH DIFFERENTIAL/PLATELET  Radiology No results found.  Pertinent labs & imaging results that were available during my care of the patient were reviewed by me and considered in my medical decision making (see MDM for details).  Medications Ordered in ED Medications - No data to display                                                                                                                                   Procedures Procedures  (including critical care time)  Medical Decision Making / ED Course   This patient presents to the ED for concern of fall, this involves an extensive number of treatment options, and is a complaint that carries with it a high risk of complications and morbidity.  The differential diagnosis includes fracture, contusion, hematoma, ligamentous injury, closed head injury, ICH, laceration, intrathoracic injury, intra-abdominal injury  MDM: Patient seen emergency room for evaluation of a fall.  Physical exam with mild tenderness of the right hip but is otherwise unremarkable.  No additional external signs of trauma seen.  Pending completion of laboratory evaluation and imaging studies at time of signout.  Please see provider signout note for continuation of workup.   Additional history obtained:  -External records from outside source obtained and reviewed including: Chart review including previous notes, labs, imaging, consultation notes   Lab Tests: -I ordered, reviewed, and interpreted labs.   The pertinent results include:   Labs Reviewed  COMPREHENSIVE METABOLIC PANEL WITH GFR  CBC WITH DIFFERENTIAL/PLATELET     Imaging Studies  ordered: I ordered imaging studies including CT head, C-spine, chest x-ray, pelvis x-ray and these are pending   Medicines ordered and prescription drug management: No orders of the defined types were placed in this encounter.   -I have reviewed the patients home medicines and have made adjustments as needed  Critical interventions none    Cardiac Monitoring: The patient was maintained on a cardiac monitor.  I personally viewed and interpreted the cardiac monitored which showed an underlying rhythm of: NSR  Social Determinants of Health:  Factors impacting patients care include: none   Reevaluation: After the interventions noted above, I reevaluated the patient and found that they have :stayed the same  Co morbidities that complicate the patient evaluation  Past Medical History:  Diagnosis Date   Complication of anesthesia    PONV      Dispostion: I considered admission for this patient, and disposition pending completion of laboratory evaluation and imaging studies.  Please see provider signout note for continuation of workup     Final Clinical Impression(s) / ED Diagnoses Final diagnoses:  None     @PCDICTATION @    Maryln Eastham, Alyse July, MD 09/18/23 [phone]

## 2023-09-18 NOTE — ED Triage Notes (Signed)
 Fall around 0300, she was getting on her bed, grabbed the rail, and doesn't know what happened. She is having right hip pain, no obvious deformity. Pt is HOH. Lives at home by herself. Walks with a walker. Took 1000mg  of tylenol  at 0300

## 2023-09-18 NOTE — ED Provider Notes (Signed)
 Patient signed out to me by Dr. Theora Flair pending results of x-rays which showed no acute fractures.  Mild hypokalemia noted and treated by prior provider.  Family at bedside and will go home with patient   Lind Repine, MD 09/18/23 [phone]

## 2023-12-31 ENCOUNTER — Non-Acute Institutional Stay (SKILLED_NURSING_FACILITY): Payer: Self-pay | Admitting: Orthopedic Surgery

## 2023-12-31 ENCOUNTER — Encounter: Payer: Self-pay | Admitting: Orthopedic Surgery

## 2023-12-31 DIAGNOSIS — E43 Unspecified severe protein-calorie malnutrition: Secondary | ICD-10-CM | POA: Diagnosis not present

## 2023-12-31 DIAGNOSIS — F03911 Unspecified dementia, unspecified severity, with agitation: Secondary | ICD-10-CM | POA: Diagnosis not present

## 2023-12-31 DIAGNOSIS — H906 Mixed conductive and sensorineural hearing loss, bilateral: Secondary | ICD-10-CM

## 2023-12-31 NOTE — Progress Notes (Unsigned)
 Location:   [facility]  Nursing Home Room Number: 18-A Place of Service:  SNF [id number]) Provider:  Greig Cluster, NP  PCP: Henry Ingle, MD  Patient Care Team: Henry Ingle, MD as PCP - General (Internal Medicine)  Extended Emergency Contact Information Primary Emergency Contact: Tressa Lamar Kings) Home Phone: [phone] Mobile Phone: [phone] Relation: Grandson Secondary Emergency Contact: [facility] Phone: [phone] Mobile Phone: [phone] Relation: Daughter  Code Status:  DNR Goals of care: Advanced Directive information    12/31/2023    4:22 PM  Advanced Directives  Does Patient Have a Medical Advance Directive? Yes  Type of Estate agent of [place];Living will;Out of facility DNR (pink MOST or yellow form)  Does patient want to make changes to medical advance directive? No - Patient declined  Copy of Healthcare Power of Attorney in Chart? No - copy requested     Chief Complaint  Patient presents with   Agitation    HPI:  Pt is a 89 y.o. female seen today for an acute visit for    Past Medical History:  Diagnosis Date   Complication of anesthesia    PONV   Past Surgical History:  Procedure Laterality Date   ABDOMINAL HYSTERECTOMY     APPENDECTOMY     BILIARY DILATION  01/31/2021   Procedure: BILIARY DILATION;  Surgeon: Wilhelmenia Aloha Raddle., MD;  Location: [facility];  Service: Gastroenterology;;   BILIARY STENT PLACEMENT N/A 12/07/2020   Procedure: BILIARY STENT PLACEMENT;  Surgeon: Charlanne Groom, MD;  Location: [facility];  Service: Endoscopy;  Laterality: N/A;   CATARACT EXTRACTION, BILATERAL     CHOLECYSTECTOMY     ENDOSCOPIC RETROGRADE CHOLANGIOPANCREATOGRAPHY (ERCP) WITH PROPOFOL  N/A 01/31/2021   Procedure: ENDOSCOPIC RETROGRADE CHOLANGIOPANCREATOGRAPHY (ERCP) WITH PROPOFOL ;  Surgeon: Wilhelmenia Aloha Raddle., MD;  Location: [facility];  Service: Gastroenterology;  Laterality: N/A;   ERCP N/A  12/07/2020   Procedure: ENDOSCOPIC RETROGRADE CHOLANGIOPANCREATOGRAPHY (ERCP);  Surgeon: Charlanne Groom, MD;  Location: [facility];  Service: Endoscopy;  Laterality: N/A;   ERCP     FEMUR IM NAIL Left 06/10/2020   Procedure: INTRAMEDULLARY (IM) NAIL FEMORAL;  Surgeon: Barbarann Oneil BROCKS, MD;  Location: [facility];  Service: Orthopedics;  Laterality: Left;   FOREIGN BODY REMOVAL  01/31/2021   Procedure: FOREIGN BODY REMOVAL;  Surgeon: Wilhelmenia Aloha Raddle., MD;  Location: [facility];  Service: Gastroenterology;;   REMOVAL OF STONES  01/31/2021   Procedure: REMOVAL OF STONES;  Surgeon: Wilhelmenia Aloha Raddle., MD;  Location: [facility];  Service: Gastroenterology;;   ANNETT  01/31/2021   Procedure: ANNETT;  Surgeon: Wilhelmenia Aloha Raddle., MD;  Location: [facility];  Service: Gastroenterology;;   LAHOMA CHOLANGIOSCOPY N/A 01/31/2021   Procedure: DEBHOJDD CHOLANGIOSCOPY;  Surgeon: Wilhelmenia Aloha Raddle., MD;  Location: [facility];  Service: Gastroenterology;  Laterality: N/A;   STENT REMOVAL  01/31/2021   Procedure: STENT REMOVAL x2;  Surgeon: Wilhelmenia Aloha Raddle., MD;  Location: [facility];  Service: Gastroenterology;;    Allergies  Allergen Reactions   Red Dye #40 (Allura Red) Anaphylaxis    Allergies as of 12/31/2023       Reactions   Red Dye #40 (allura Red) Anaphylaxis        Medication List        Accurate as of December 31, 2023  4:22 PM. If you have any questions, ask your nurse or doctor.          acetaminophen  500 MG tablet Commonly known as: TYLENOL  Take 1,000 mg by mouth  every 6 (six) hours as needed for mild pain.   cholecalciferol 25 MCG (1000 UNIT) tablet Commonly known as: VITAMIN D3 Take 1,000 Units by mouth daily.   loperamide 2 MG tablet Commonly known as: IMODIUM A-D Take 2 mg by mouth as needed for diarrhea or loose stools.   loratadine 10 MG tablet Commonly known as: CLARITIN Take 10 mg by mouth daily as needed for  allergies.   LORazepam  0.5 MG tablet Commonly known as: ATIVAN  Take 0.5 mg by mouth every 12 (twelve) hours as needed for anxiety.   mirtazapine  15 MG tablet Commonly known as: REMERON  Take 15 mg by mouth at bedtime.   omeprazole  20 MG tablet Commonly known as: PriLOSEC  OTC Take 1 tablet (20 mg total) by mouth 2 (two) times daily. 40 mg for 1 month and then 20 mg for 1 month then stop prescription.   ondansetron  4 MG tablet Commonly known as: ZOFRAN  Take 4 mg by mouth every 6 (six) hours as needed for nausea or vomiting.   QUEtiapine  25 MG tablet Commonly known as: SEROQUEL  Take 25 mg by mouth every 6 (six) hours as needed.        Review of Systems  Immunization History  Administered Date(s) Administered   INFLUENZA, HIGH DOSE SEASONAL PF 03/03/2018   Pertinent  Health Maintenance Due  Topic Date Due   DEXA SCAN  Never done   Influenza Vaccine  11/23/2023      12/08/2020   10:22 PM 12/09/2020   11:06 AM 12/09/2020    8:27 PM 12/10/2020    9:00 AM 01/31/2021    7:07 AM  Fall Risk  (RETIRED) Patient Fall Risk Level High fall risk  High fall risk  High fall risk  High fall risk  High fall risk      Data saved with a previous flowsheet row definition   Functional Status Survey:    There were no vitals filed for this visit. There is no height or weight on file to calculate BMI. Physical Exam  Labs reviewed: Recent Labs    07/06/23 2023 07/07/23 1049 08/08/23 1614 09/18/23 0629  NA 137 138 134* 130*  K 2.5* 4.3 5.1 2.9*  CL 96* 103 96* 93*  CO2 30 27 30 29   GLUCOSE 93 90 100* 77  BUN 21 14 34* 34*  CREATININE 0.57 0.41* 0.53 0.65  CALCIUM 8.5* 8.2* 9.3 8.9  MG 1.0* 1.4* 1.8  --    Recent Labs    07/07/23 1049 08/08/23 1614 09/18/23 0629  AST 21 38 51*  ALT 17 40 73*  ALKPHOS 76 83 156*  BILITOT 1.3* 0.8 0.6  PROT 5.6* 6.9 6.5  ALBUMIN 3.0* 3.9 3.5   Recent Labs    07/06/23 2023 07/07/23 1049 08/08/23 1614 09/18/23 0629  WBC 8.8 8.3 7.9  7.7  NEUTROABS 5.8  --  5.7 4.8  HGB 12.5 11.7* 13.5 12.7  HCT 37.3 36.9 40.7 39.2  MCV 94.7 98.9 99.8 102.1*  PLT 242 237 235 174   No results found for: TSH No results found for: HGBA1C No results found for: CHOL, HDL, LDLCALC, LDLDIRECT, TRIG, CHOLHDL  Significant Diagnostic Results in last 30 days:  No results found.  Assessment/Plan There are no diagnoses linked to this encounter.   Family/ staff Communication:   Labs/tests ordered:

## 2024-01-02 ENCOUNTER — Encounter: Payer: Self-pay | Admitting: Orthopedic Surgery

## 2024-01-02 MED ORDER — MIRTAZAPINE 15 MG PO TABS: 15.0000 mg | ORAL_TABLET | Freq: Every morning | ORAL | Status: DC

## 2024-01-03 ENCOUNTER — Non-Acute Institutional Stay (SKILLED_NURSING_FACILITY): Payer: Self-pay | Admitting: Internal Medicine

## 2024-01-03 ENCOUNTER — Encounter: Payer: Self-pay | Admitting: Internal Medicine

## 2024-01-03 DIAGNOSIS — Z66 Do not resuscitate: Secondary | ICD-10-CM

## 2024-01-03 NOTE — Progress Notes (Unsigned)
 Provider:    Charlanne Fredia CROME, MD  Location:  [facility] Room Number: N18-A Place of Service:  SNF ([phone])  PCP: Henry Ingle, MD Patient Care Team: Henry Ingle, MD as PCP - General (Internal Medicine)  Extended Emergency Contact Information Primary Emergency Contact: Tressa Lamar [place]) Home Phone: [phone] Mobile Phone: [phone] Relation: Grandson Secondary Emergency Contact: [facility] Phone: [phone] Mobile Phone: [phone] Relation: Daughter  Code Status: DNR. Hospice Goals of Care: Advanced Directive information    12/31/2023    4:22 PM  Advanced Directives  Does Patient Have a Medical Advance Directive? Yes  Type of Estate agent of [place];Living will;Out of facility DNR (pink MOST or yellow form)  Does patient want to make changes to medical advance directive? No - Patient declined  Copy of Healthcare Power of Attorney in Chart? No - copy requested      Chief Complaint  Patient presents with   New Admit To SNF    HPI: Patient is a 89 y.o. female seen today for admission to  Past Medical History:  Diagnosis Date   Complication of anesthesia    PONV   Past Surgical History:  Procedure Laterality Date   ABDOMINAL HYSTERECTOMY     APPENDECTOMY     BILIARY DILATION  01/31/2021   Procedure: BILIARY DILATION;  Surgeon: Wilhelmenia Aloha Raddle., MD;  Location: [facility];  Service: Gastroenterology;;   BILIARY STENT PLACEMENT N/A 12/07/2020   Procedure: BILIARY STENT PLACEMENT;  Surgeon: Charlanne Groom, MD;  Location: [facility];  Service: Endoscopy;  Laterality: N/A;   CATARACT EXTRACTION, BILATERAL     CHOLECYSTECTOMY     ENDOSCOPIC RETROGRADE CHOLANGIOPANCREATOGRAPHY (ERCP) WITH PROPOFOL  N/A 01/31/2021   Procedure: ENDOSCOPIC RETROGRADE CHOLANGIOPANCREATOGRAPHY (ERCP) WITH PROPOFOL ;  Surgeon: Wilhelmenia Aloha Raddle., MD;  Location: [facility];  Service: Gastroenterology;  Laterality: N/A;    ERCP N/A 12/07/2020   Procedure: ENDOSCOPIC RETROGRADE CHOLANGIOPANCREATOGRAPHY (ERCP);  Surgeon: Charlanne Groom, MD;  Location: [facility];  Service: Endoscopy;  Laterality: N/A;   ERCP     FEMUR IM NAIL Left 06/10/2020   Procedure: INTRAMEDULLARY (IM) NAIL FEMORAL;  Surgeon: Barbarann Oneil BROCKS, MD;  Location: [facility];  Service: Orthopedics;  Laterality: Left;   FOREIGN BODY REMOVAL  01/31/2021   Procedure: FOREIGN BODY REMOVAL;  Surgeon: Wilhelmenia Aloha Raddle., MD;  Location: [facility];  Service: Gastroenterology;;   REMOVAL OF STONES  01/31/2021   Procedure: REMOVAL OF STONES;  Surgeon: Wilhelmenia Aloha Raddle., MD;  Location: [facility];  Service: Gastroenterology;;   ANNETT  01/31/2021   Procedure: ANNETT;  Surgeon: Wilhelmenia Aloha Raddle., MD;  Location: [facility];  Service: Gastroenterology;;   LAHOMA CHOLANGIOSCOPY N/A 01/31/2021   Procedure: DEBHOJDD CHOLANGIOSCOPY;  Surgeon: Wilhelmenia Aloha Raddle., MD;  Location: [facility];  Service: Gastroenterology;  Laterality: N/A;   STENT REMOVAL  01/31/2021   Procedure: STENT REMOVAL x2;  Surgeon: Wilhelmenia Aloha Raddle., MD;  Location: [facility];  Service: Gastroenterology;;    reports that she has never smoked. She has never used smokeless tobacco. She reports that she does not drink alcohol and does not use drugs. Social History   Socioeconomic History   Marital status: Single    Spouse name: Not on file   Number of children: Not on file   Years of education: Not on file   Highest education level: Not on file  Occupational History   Not on file  Tobacco Use   Smoking status: Never   Smokeless tobacco: Never  Vaping  Use   Vaping status: Never Used  Substance and Sexual Activity   Alcohol use: Never   Drug use: Never   Sexual activity: Not Currently  Other Topics Concern   Not on file  Social History Narrative   Not on file   Social Drivers of Health   Financial Resource Strain: Not on file   Food Insecurity: No Food Insecurity (07/07/2023)   Hunger Vital Sign    Worried About Running Out of Food in the Last Year: Never true    Ran Out of Food in the Last Year: Never true  Transportation Needs: No Transportation Needs (07/07/2023)   PRAPARE - Administrator, Civil Service (Medical): No    Lack of Transportation (Non-Medical): No  Physical Activity: Not on file  Stress: Not on file  Social Connections: Unknown (07/07/2023)   Social Connection and Isolation Panel    Frequency of Communication with Friends and Family: Twice a week    Frequency of Social Gatherings with Friends and Family: Twice a week    Attends Religious Services: More than 4 times per year    Active Member of [employer] or Organizations: Patient declined    Attends Banker Meetings: Patient declined    Marital Status: Patient declined  Intimate Partner Violence: Not At Risk (07/07/2023)   Humiliation, Afraid, Rape, and Kick questionnaire    Fear of Current or Ex-Partner: No    Emotionally Abused: No    Physically Abused: No    Sexually Abused: No    Functional Status Survey:    Family History  Problem Relation Age of Onset   Colon cancer Neg Hx    Esophageal cancer Neg Hx    Inflammatory bowel disease Neg Hx    Liver disease Neg Hx    Pancreatic cancer Neg Hx    Rectal cancer Neg Hx    Stomach cancer Neg Hx     Health Maintenance  Topic Date Due   DTaP/Tdap/Td (1 - Tdap) Never done   Pneumococcal Vaccine: 50+ Years (1 of 1 - PCV) Never done   Zoster Vaccines- Shingrix (1 of 2) Never done   DEXA SCAN  Never done   Influenza Vaccine  11/23/2023   COVID-19 Vaccine (7 - 2024-25 season) 12/24/2023   HPV VACCINES  Aged Out   Meningococcal B Vaccine  Aged Out    Allergies  Allergen Reactions   Red Dye #40 (Allura Red) Anaphylaxis    Outpatient Encounter Medications as of 01/03/2024  Medication Sig   loperamide (IMODIUM A-D) 2 MG tablet Take 2 mg by mouth as needed for  diarrhea or loose stools.   LORazepam  (ATIVAN ) 0.5 MG tablet Take 0.5 mg by mouth every 12 (twelve) hours as needed for anxiety (Give if increased agitation 1 hour after taking Seroquel ).   mirtazapine  (REMERON ) 15 MG tablet Take 1 tablet (15 mg total) by mouth every morning.   ondansetron  (ZOFRAN ) 4 MG tablet Take 4 mg by mouth every 6 (six) hours as needed for nausea or vomiting.   QUEtiapine  (SEROQUEL ) 25 MG tablet Take 25 mg by mouth every 12 (twelve) hours as needed (increased agitation).   ZINC OXIDE, TOPICAL, 10 % CREA Apply 1 Application topically as needed.   mirtazapine  (REMERON ) 15 MG tablet Take 15 mg by mouth at bedtime. (Patient not taking: Reported on 01/03/2024)   No facility-administered encounter medications on file as of 01/03/2024.    Review of Systems  Vitals:   01/03/24 1326  BP: 129/84  Pulse: 62  Temp: (!) 97.1 F (36.2 C)  SpO2: 93%  Weight: 65 lb 11.2 oz (29.8 kg)  Height: 5' 1 (1.549 m)   Body mass index is 12.41 kg/m. Physical Exam  Labs reviewed: Basic Metabolic Panel: Recent Labs    07/06/23 2023 07/07/23 1049 08/08/23 1614 09/18/23 0629  NA 137 138 134* 130*  K 2.5* 4.3 5.1 2.9*  CL 96* 103 96* 93*  CO2 30 27 30 29   GLUCOSE 93 90 100* 77  BUN 21 14 34* 34*  CREATININE 0.57 0.41* 0.53 0.65  CALCIUM 8.5* 8.2* 9.3 8.9  MG 1.0* 1.4* 1.8  --    Liver Function Tests: Recent Labs    07/07/23 1049 08/08/23 1614 09/18/23 0629  AST 21 38 51*  ALT 17 40 73*  ALKPHOS 76 83 156*  BILITOT 1.3* 0.8 0.6  PROT 5.6* 6.9 6.5  ALBUMIN 3.0* 3.9 3.5   Recent Labs    08/08/23 1614  LIPASE 46   No results for input(s): AMMONIA in the last 8760 hours. CBC: Recent Labs    07/06/23 2023 07/07/23 1049 08/08/23 1614 09/18/23 0629  WBC 8.8 8.3 7.9 7.7  NEUTROABS 5.8  --  5.7 4.8  HGB 12.5 11.7* 13.5 12.7  HCT 37.3 36.9 40.7 39.2  MCV 94.7 98.9 99.8 102.1*  PLT 242 237 235 174   Cardiac Enzymes: Recent Labs    07/06/23 2023  CKTOTAL  36*   BNP: Invalid input(s): POCBNP No results found for: HGBA1C No results found for: TSH Lab Results  Component Value Date   VITAMINB12 408 06/13/2020   Lab Results  Component Value Date   FOLATE 8.5 06/13/2020   Lab Results  Component Value Date   IRON 18 (L) 06/13/2020   TIBC 192 (L) 06/13/2020   FERRITIN 121 06/13/2020    Imaging and Procedures obtained prior to SNF admission: DG Pelvis Portable Result Date: 09/18/2023 CLINICAL DATA:  89 year old female status post fall. Pain. EXAM: PORTABLE PELVIS 1-2 VIEWS COMPARISON:  CT Abdomen and Pelvis 12/06/2020. FINDINGS: Chronic left femur intramedullary rod with interlocking hardware partially visible, present in 2022. Femoral heads appear to remain normally located. Bony pelvis appears symmetric, intact. Grossly intact proximal femurs. Chronic postoperative changes to the ventral lower abdominal wall, chronic pelvic phleboliths and flank subcutaneous calcifications. Nonobstructed bowel-gas pattern. IMPRESSION: 1. No acute fracture or dislocation identified about the pelvis. 2. Chronic left femur ORIF. If there is lateralizing hip pain then dedicated hip series is recommended. Electronically Signed   By: VEAR Hurst M.D.   On: 09/18/2023 07:25   CT CERVICAL SPINE WO CONTRAST Result Date: 09/18/2023 CLINICAL DATA:  89 year old female status post fall. Pain. EXAM: CT CERVICAL SPINE WITHOUT CONTRAST TECHNIQUE: Multidetector CT imaging of the cervical spine was performed without intravenous contrast. Multiplanar CT image reconstructions were also generated. RADIATION DOSE REDUCTION: This exam was performed according to the departmental dose-optimization program which includes automated exposure control, adjustment of the mA and/or kV according to patient size and/or use of iterative reconstruction technique. COMPARISON:  Head CT today.  CTA chest 07/06/2023. FINDINGS: Alignment: Exaggerated cervical lordosis, exaggerated upper thoracic  kyphosis. Cervicothoracic junction alignment is within normal limits. Bilateral posterior element alignment is within normal limits. Skull base and vertebrae: Visualized skull base is intact. No atlanto-occipital dissociation. C1 and C2 appear intact and aligned. No acute osseous abnormality identified. Soft tissues and spinal canal: No prevertebral fluid or swelling. No visible canal hematoma. Negative visible noncontrast neck  soft tissues. Disc levels: Left side C1-C2 degeneration, moderate to severe joint space loss there with subchondral sclerosis. Mostly age-appropriate cervical spine degeneration elsewhere. Some disc calcification. Degenerative facet ankylosis at C7-T1. Upper chest: Mild T2 compression fracture appears chronic and stable since March. Other visible upper thoracic levels appear grossly intact. Partially visible thoracic ankylosis. Hyperinflated appearing lungs. Calcified aortic atherosclerosis. IMPRESSION: 1. No acute traumatic injury identified in the cervical spine. 2.  Aortic Atherosclerosis (ICD10-I70.0). Electronically Signed   By: VEAR Hurst M.D.   On: 09/18/2023 07:23   CT HEAD WO CONTRAST ( ) Result Date: 09/18/2023 CLINICAL DATA:  89 year old female status post fall. Pain. EXAM: CT HEAD WITHOUT CONTRAST TECHNIQUE: Contiguous axial images were obtained from the base of the skull through the vertex without intravenous contrast. RADIATION DOSE REDUCTION: This exam was performed according to the departmental dose-optimization program which includes automated exposure control, adjustment of the mA and/or kV according to patient size and/or use of iterative reconstruction technique. COMPARISON:  None Available. FINDINGS: Brain: No midline shift, ventriculomegaly, mass effect, evidence of mass lesion, intracranial hemorrhage or evidence of cortically based acute infarction. Small chronic appearing left cerebellar SCA territory infarct. Patchy and confluent bilateral cerebral white matter  hypodensity, with deep gray nuclei heterogeneity, more involving the thalami. Vascular: No suspicious intracranial vascular hyperdensity. Calcified atherosclerosis at the skull base. Skull: Appears intact.  No acute osseous abnormality identified. Sinuses/Orbits: Visualized paranasal sinuses and mastoids are clear. Other: No acute orbit or scalp soft tissue injury identified. IMPRESSION: 1. No acute intracranial abnormality or acute traumatic injury identified. 2. Cerebral small vessel disease, including a small chronic left cerebellar infarct. Electronically Signed   By: VEAR Hurst M.D.   On: 09/18/2023 07:20   DG Chest Portable 1 View Result Date: 09/18/2023 CLINICAL DATA:  89 year old female status post fall.  Pain. EXAM: PORTABLE CHEST 1 VIEW COMPARISON:  Chest CTA 07/06/2023 and earlier. FINDINGS: Portable AP semi upright view at 0643 hours. Chronic thoracic kyphosis. Stable large lung volumes, mediastinal contours. No pneumothorax. Allowing for portable technique the lungs are clear. Calcified aortic atherosclerosis. Chronic lower thoracic compression fracture. No new osseous abnormality identified. Paucity of bowel gas. IMPRESSION: 1. No acute cardiopulmonary abnormality. 2. Chronic kyphosis and thoracic compression fracture. 3. Aortic Atherosclerosis (ICD10-I70.0). Electronically Signed   By: VEAR Hurst M.D.   On: 09/18/2023 07:03    Assessment/Plan 1. DNR (do not resuscitate) (Primary) ***    Family/ staff Communication:   Labs/tests ordered:

## 2024-01-05 ENCOUNTER — Emergency Department (HOSPITAL_COMMUNITY)

## 2024-01-05 ENCOUNTER — Emergency Department (HOSPITAL_COMMUNITY)
Admission: EM | Admit: 2024-01-05 | Discharge: 2024-01-06 | Disposition: A | Discharge status: Home / Self Care | Attending: Emergency Medicine | Admitting: Emergency Medicine

## 2024-01-05 ENCOUNTER — Other Ambulatory Visit: Payer: Self-pay

## 2024-01-05 ENCOUNTER — Encounter (HOSPITAL_COMMUNITY): Payer: Self-pay

## 2024-01-05 DIAGNOSIS — W08XXXA Fall from other furniture, initial encounter: Secondary | ICD-10-CM | POA: Diagnosis not present

## 2024-01-05 DIAGNOSIS — F0390 Unspecified dementia without behavioral disturbance: Secondary | ICD-10-CM | POA: Insufficient documentation

## 2024-01-05 DIAGNOSIS — S0001XA Abrasion of scalp, initial encounter: Secondary | ICD-10-CM | POA: Diagnosis present

## 2024-01-05 DIAGNOSIS — W19XXXA Unspecified fall, initial encounter: Secondary | ICD-10-CM

## 2024-01-05 NOTE — ED Triage Notes (Signed)
 PT. Arrives [facility] fall [facility]. Pt has a lac to the left side of her forehead. Hx of dementia. Pt. Denies pain

## 2024-01-05 NOTE — ED Provider Notes (Signed)
 [place] [facility]PARTMENT AT [facility] Provider Note  CSN: [id number] Arrival date & time: 01/05/24 2050  Chief Complaint(s) Fall  HPI Alison Carpenter is a 89 y.o. female who is here today after she fell out of her recliner.  Patient has a history of dementia.  Lives in a skilled nursing facility.  Does not ambulate.   Past Medical History Past Medical History:  Diagnosis Date   Complication of anesthesia    PONV   Patient Active Problem List   Diagnosis Date Noted   Hypomagnesemia 07/07/2023   GERD (gastroesophageal reflux disease) 07/07/2023   Hypokalemia 07/06/2023   Chest pain 07/06/2023   Choledocholithiasis 01/28/2021   History of biliary duct stent placement 01/28/2021   Ascending cholangitis 01/28/2021   History of ERCP 01/28/2021   Abnormal LFTs 01/28/2021   Postoperative nausea and vomiting 01/28/2021   Pressure injury of skin 12/07/2020   Biliary obstruction 12/06/2020   Iron deficiency anemia 06/16/2020   Underweight 06/16/2020   Physical deconditioning 06/16/2020   Closed nondisplaced intertrochanteric fracture of left femur (HCC)    Home Medication(s) Prior to Admission medications   Medication Sig Start Date End Date Taking? Authorizing Provider  loperamide (IMODIUM A-D) 2 MG tablet Take 2 mg by mouth as needed for diarrhea or loose stools.    [provider]  LORazepam  (ATIVAN ) 0.5 MG tablet Take 0.5 mg by mouth every 12 (twelve) hours as needed for anxiety (Give if increased agitation 1 hour after taking Seroquel ).    [provider]  mirtazapine  (REMERON ) 15 MG tablet Take 1 tablet (15 mg total) by mouth every morning. 01/02/24   Fargo, Amy E, NP  ondansetron  (ZOFRAN ) 4 MG tablet Take 4 mg by mouth every 6 (six) hours as needed for nausea or vomiting.    [provider]  QUEtiapine  (SEROQUEL ) 25 MG tablet Take 25 mg by mouth every 12 (twelve) hours as needed (increased agitation).    [provider]  ZINC OXIDE, TOPICAL, 10 % CREA Apply 1 Application topically as needed.    [provider]                                                                                                                                    Past Surgical History Past Surgical History:  Procedure Laterality Date   ABDOMINAL HYSTERECTOMY     APPENDECTOMY     BILIARY DILATION  01/31/2021   Procedure: BILIARY DILATION;  Surgeon: Wilhelmenia Aloha Raddle., MD;  Location: [facility];  Service: Gastroenterology;;   BILIARY STENT PLACEMENT N/A 12/07/2020   Procedure: BILIARY STENT PLACEMENT;  Surgeon: Charlanne Groom, MD;  Location: [facility];  Service: Endoscopy;  Laterality: N/A;   CATARACT EXTRACTION, BILATERAL     CHOLECYSTECTOMY     ENDOSCOPIC RETROGRADE CHOLANGIOPANCREATOGRAPHY (ERCP) WITH PROPOFOL  N/A 01/31/2021   Procedure: ENDOSCOPIC RETROGRADE CHOLANGIOPANCREATOGRAPHY (ERCP) WITH PROPOFOL ;  Surgeon: Wilhelmenia Aloha Raddle.,  MD;  Location: [facility];  Service: Gastroenterology;  Laterality: N/A;   ERCP N/A 12/07/2020   Procedure: ENDOSCOPIC RETROGRADE CHOLANGIOPANCREATOGRAPHY (ERCP);  Surgeon: Charlanne Groom, MD;  Location: [facility];  Service: Endoscopy;  Laterality: N/A;   ERCP     FEMUR IM NAIL Left 06/10/2020   Procedure: INTRAMEDULLARY (IM) NAIL FEMORAL;  Surgeon: Barbarann Oneil BROCKS, MD;  Location: [facility];  Service: Orthopedics;  Laterality: Left;   FOREIGN BODY REMOVAL  01/31/2021   Procedure: FOREIGN BODY REMOVAL;  Surgeon: Wilhelmenia Aloha Raddle., MD;  Location: [facility];  Service: Gastroenterology;;   REMOVAL OF STONES  01/31/2021   Procedure: REMOVAL OF STONES;  Surgeon: Wilhelmenia Aloha Raddle., MD;  Location: [facility];  Service: Gastroenterology;;   ANNETT  01/31/2021   Procedure: ANNETT;  Surgeon: Wilhelmenia Aloha Raddle., MD;  Location: [facility];  Service: Gastroenterology;;   LAHOMA CHOLANGIOSCOPY N/A 01/31/2021   Procedure: DEBHOJDD CHOLANGIOSCOPY;   Surgeon: Wilhelmenia Aloha Raddle., MD;  Location: [facility];  Service: Gastroenterology;  Laterality: N/A;   STENT REMOVAL  01/31/2021   Procedure: STENT REMOVAL x2;  Surgeon: Wilhelmenia Aloha Raddle., MD;  Location: [facility];  Service: Gastroenterology;;   Family History Family History  Problem Relation Age of Onset   Colon cancer Neg Hx    Esophageal cancer Neg Hx    Inflammatory bowel disease Neg Hx    Liver disease Neg Hx    Pancreatic cancer Neg Hx    Rectal cancer Neg Hx    Stomach cancer Neg Hx     Social History Social History   Tobacco Use   Smoking status: Never   Smokeless tobacco: Never  Vaping Use   Vaping status: Never Used  Substance Use Topics   Alcohol use: Never   Drug use: Never   Allergies Red dye #40 (allura red)  Review of Systems Review of Systems  Physical Exam Vital Signs  I have reviewed the triage vital signs BP 129/78   Pulse 83   Temp 98 F (36.7 C) (Oral)   Resp 15   SpO2 97%   Physical Exam Vitals and nursing note reviewed.  HENT:     Head: Normocephalic.     Comments: 4 cm curvilinear laceration over the left forehead. Cardiovascular:     Rate and Rhythm: Normal rate.  Pulmonary:     Effort: Pulmonary effort is normal.  Abdominal:     General: Abdomen is flat.  Musculoskeletal:        General: Normal range of motion.     Comments: No tenderness to palpation in the bilateral shoulders, upper arms, elbows, forearms or wrists.  No tenderness to palpation in the chest.  Pelvis stable, nontender.  No tenderness, deformities noted on bilateral upper legs, knees, lower legs or ankles.  Patient able to lift both legs from the bed.  Neurological:     Mental Status: She is alert.     ED Results and Treatments Labs (all labs ordered are listed, but only abnormal results are displayed) Labs Reviewed - No data to display  Radiology CT Head Wo Contrast Result Date: 01/05/2024 CLINICAL DATA:  Ataxia, cervical trauma; Ataxia, head trauma [facility]. Pt has a lac to the left side of her forehead. Hx of dementia. Pt. Denies pain EXAM: CT HEAD WITHOUT CONTRAST CT CERVICAL SPINE WITHOUT CONTRAST TECHNIQUE: Multidetector CT imaging of the head and cervical spine was performed following the standard protocol without intravenous contrast. Multiplanar CT image reconstructions of the cervical spine were also generated. RADIATION DOSE REDUCTION: This exam was performed according to the departmental dose-optimization program which includes automated exposure control, adjustment of the mA and/or kV according to patient size and/or use of iterative reconstruction technique. COMPARISON:  CT head and C-spine 09/18/2023. FINDINGS: CT HEAD FINDINGS Brain: Patchy and confluent areas of decreased attenuation are noted throughout the deep and periventricular white matter of the cerebral hemispheres bilaterally, compatible with chronic microvascular ischemic disease. No evidence of large-territorial acute infarction. No parenchymal hemorrhage. No mass lesion. No extra-axial collection. No mass effect or midline shift. No hydrocephalus. Basilar cisterns are patent. Vascular: No hyperdense vessel. Atherosclerotic calcifications are present within the cavernous internal carotid and vertebral arteries. Skull: No acute fracture or focal lesion. Sinuses/Orbits: Paranasal sinuses and mastoid air cells are clear. The orbits are unremarkable. Other: None. CT CERVICAL SPINE FINDINGS Alignment: Normal. Skull base and vertebrae: No acute fracture. No aggressive appearing focal osseous lesion or focal pathologic process. Soft tissues and spinal canal: No prevertebral fluid or swelling. No visible canal hematoma. Upper chest: Biapical pleural/pulmonary scarring. Other: Atherosclerotic plaque of the aortic arch and its  main branches. IMPRESSION: 1. No acute intracranial abnormality. 2. No acute displaced fracture or traumatic listhesis of the cervical spine. 3.  Aortic Atherosclerosis (ICD10-I70.0). Electronically Signed   By: Morgane  Naveau M.D.   On: 01/05/2024 21:46   CT Cervical Spine Wo Contrast Result Date: 01/05/2024 CLINICAL DATA:  Ataxia, cervical trauma; Ataxia, head trauma [facility]. Pt has a lac to the left side of her forehead. Hx of dementia. Pt. Denies pain EXAM: CT HEAD WITHOUT CONTRAST CT CERVICAL SPINE WITHOUT CONTRAST TECHNIQUE: Multidetector CT imaging of the head and cervical spine was performed following the standard protocol without intravenous contrast. Multiplanar CT image reconstructions of the cervical spine were also generated. RADIATION DOSE REDUCTION: This exam was performed according to the departmental dose-optimization program which includes automated exposure control, adjustment of the mA and/or kV according to patient size and/or use of iterative reconstruction technique. COMPARISON:  CT head and C-spine 09/18/2023. FINDINGS: CT HEAD FINDINGS Brain: Patchy and confluent areas of decreased attenuation are noted throughout the deep and periventricular white matter of the cerebral hemispheres bilaterally, compatible with chronic microvascular ischemic disease. No evidence of large-territorial acute infarction. No parenchymal hemorrhage. No mass lesion. No extra-axial collection. No mass effect or midline shift. No hydrocephalus. Basilar cisterns are patent. Vascular: No hyperdense vessel. Atherosclerotic calcifications are present within the cavernous internal carotid and vertebral arteries. Skull: No acute fracture or focal lesion. Sinuses/Orbits: Paranasal sinuses and mastoid air cells are clear. The orbits are unremarkable. Other: None. CT CERVICAL SPINE FINDINGS Alignment: Normal. Skull base and vertebrae: No acute fracture. No aggressive appearing  focal osseous lesion or focal pathologic process. Soft tissues and spinal canal: No prevertebral fluid or swelling. No visible canal hematoma. Upper chest: Biapical pleural/pulmonary scarring. Other: Atherosclerotic plaque of the aortic arch and its main branches. IMPRESSION: 1. No acute intracranial abnormality. 2. No acute displaced fracture  or traumatic listhesis of the cervical spine. 3.  Aortic Atherosclerosis (ICD10-I70.0). Electronically Signed   By: Morgane  Naveau M.D.   On: 01/05/2024 21:46    Pertinent labs & imaging results that were available during my care of the patient were reviewed by me and considered in my medical decision making (see MDM for details).  Medications Ordered in ED Medications - No data to display                                                                                                                                   Procedures Procedures  (including critical care time)  Medical Decision Making / ED Course   This patient presents to the ED for concern of fall, this involves an extensive number of treatment options, and is a complaint that carries with it a high risk of complications and morbidity.  The differential diagnosis includes skin tear, intracranial hemorrhage, cervical spine injury.  MDM: Patient is 12 years old, she is in hospice.  Patient quite frail.  Peers to be a skin tear to the left side of the forehead.  Skin not thick enough to be repaired with sutures.  Will clean, apply an adhesive dressing.  Will obtain imaging of the patient's head and neck.  Do not believe blood work is warranted given the patient's advanced age, hospice status.  Will avoid painful or invasive procedures in this patient.  Reassessment 9:50 PM-patient's head and neck CT negative per my independent review.  Wound has been cleared.  Low risk for tetanus, do not believe it is indicated to provide this time.  Will discharge.   Additional history  obtained: -Additional history obtained from EMS -External records from outside source obtained and reviewed including: Chart review including previous notes, labs, imaging, consultation notes   Lab Tests: -I ordered, reviewed, and interpreted labs.   The pertinent results include:   Labs Reviewed - No data to display    Imaging Studies ordered: I ordered imaging studies including head CT, neck CT I independently visualized and interpreted imaging. I agree with the radiologist interpretation   Medicines ordered and prescription drug management: No orders of the defined types were placed in this encounter.   -I have reviewed the patients home medicines and have made adjustments as needed   Cardiac Monitoring: The patient was maintained on a cardiac monitor.  I personally viewed and interpreted the cardiac monitored which showed an underlying rhythm of: Normal sinus rhythm  Social Determinants of Health:  Factors impacting patients care include: Skilled nursing facility resident   Reevaluation: After the interventions noted above, I reevaluated the patient and found that they have :improved  Co morbidities that complicate the patient evaluation  Past Medical History:  Diagnosis Date   Complication of anesthesia    PONV      Dispostion: I considered admission for this patient, however with her reassuring workup she is appropriate for  discharge.     Final Clinical Impression(s) / ED Diagnoses Final diagnoses:  Fall, initial encounter  Abrasion of scalp, initial encounter     @PCDICTATION @    Mannie Pac T, DO 01/05/24 2155

## 2024-01-05 NOTE — Discharge Instructions (Addendum)
 Alison Carpenter had CT scans done of her head and neck that were negative.  The wound on her head is not able to be repaired with stitches because the skin is very thin.  You may continue to apply some antibiotic ointment and a bandage to the area.  She may take all medications as prescribed.

## 2024-01-05 NOTE — ED Notes (Signed)
 [facility] called to arrange transportation to [facility].

## 2024-01-06 MED ORDER — QUETIAPINE FUMARATE 50 MG PO TABS
25.0000 mg | ORAL_TABLET | Freq: Once | ORAL | Status: AC
  Administered 2024-01-06: 25 mg via ORAL
  Filled 2024-01-06: qty 1

## 2024-01-06 NOTE — ED Notes (Signed)
 Patient found standing at the edge of the bed. Posey belt placed for patient safety.

## 2024-01-06 NOTE — ED Notes (Signed)
 Pt had to use bathroom, refused bedpan. Used BSC. I stayed in room with patient. Patient changed into clean brief.

## 2024-01-06 NOTE — ED Notes (Signed)
 Patient came out of the posey belt. Patient put back on posey belt. Patient swung at this nurse and nurse assisting with the posey belt.

## 2024-01-06 NOTE — ED Notes (Signed)
 Patient sitting/standing at edge of bed. Patient assisted back to the bed. Keeps saying she will get out of bed

## 2024-01-07 ENCOUNTER — Non-Acute Institutional Stay (SKILLED_NURSING_FACILITY): Payer: Self-pay | Admitting: Orthopedic Surgery

## 2024-01-07 ENCOUNTER — Encounter: Payer: Self-pay | Admitting: Orthopedic Surgery

## 2024-01-07 DIAGNOSIS — S0101XD Laceration without foreign body of scalp, subsequent encounter: Secondary | ICD-10-CM | POA: Diagnosis not present

## 2024-01-07 DIAGNOSIS — R296 Repeated falls: Secondary | ICD-10-CM | POA: Diagnosis not present

## 2024-01-07 NOTE — Progress Notes (Signed)
 Location:  [facility] Room Number: 18/A Place of Service:  SNF [id number]) Provider:  Greig FORBES Cluster, NP   Charlanne Fredia CROME, MD  Patient Care Team: Charlanne Fredia CROME, MD as PCP - General (Internal Medicine)  Extended Emergency Contact Information Primary Emergency Contact: Tressa Lamar Kings) Home Phone: [phone] Mobile Phone: [phone] Relation: Grandson Secondary Emergency Contact: [facility] Phone: [phone] Mobile Phone: [phone] Relation: Daughter  Code Status:  DNR Goals of care: Advanced Directive information    01/05/2024    9:06 PM  Advanced Directives  Does Patient Have a Medical Advance Directive? Yes  Type of Advance Directive Living will;Healthcare Power of Attorney  Does patient want to make changes to medical advance directive? No - Patient declined     Chief Complaint  Patient presents with   Acute Visit    Head laceration s/p fall/ ED visit    HPI:  Pt is a 89 y.o. female seen today for f/u s/p ED visit due to head laceration.   She currently resides on the skilled nursing unit at [facility] PMH: CVA, vascular dementia with behaviors, recurrent falls, h/o left femur fracture, biliary obstruction 2022,GERD, protein calorie malnutrition and hearing loss.   Followed by hospice.   09/13 she was found by nursing staff on the floor with laceration to left temporal. She was sent to ED for evaluation. Sutures not recommended for wound closure. Wound was cleaned and covered with adhesive dressing. CT head/spine negative for acute intracranial abnormality or fracture of cervical spine. She was discharged back to SNF.   Poor historian due to dementia. She notes having a headache yesterday but has since resolved. She is eating breakfast. Vitals stable.     Past Medical History:  Diagnosis Date   Complication of anesthesia    PONV   Past Surgical History:  Procedure Laterality Date   ABDOMINAL HYSTERECTOMY      APPENDECTOMY     BILIARY DILATION  01/31/2021   Procedure: BILIARY DILATION;  Surgeon: Wilhelmenia Aloha Raddle., MD;  Location: [facility];  Service: Gastroenterology;;   BILIARY STENT PLACEMENT N/A 12/07/2020   Procedure: BILIARY STENT PLACEMENT;  Surgeon: Charlanne Groom, MD;  Location: [facility];  Service: Endoscopy;  Laterality: N/A;   CATARACT EXTRACTION, BILATERAL     CHOLECYSTECTOMY     ENDOSCOPIC RETROGRADE CHOLANGIOPANCREATOGRAPHY (ERCP) WITH PROPOFOL  N/A 01/31/2021   Procedure: ENDOSCOPIC RETROGRADE CHOLANGIOPANCREATOGRAPHY (ERCP) WITH PROPOFOL ;  Surgeon: Wilhelmenia Aloha Raddle., MD;  Location: [facility];  Service: Gastroenterology;  Laterality: N/A;   ERCP N/A 12/07/2020   Procedure: ENDOSCOPIC RETROGRADE CHOLANGIOPANCREATOGRAPHY (ERCP);  Surgeon: Charlanne Groom, MD;  Location: [facility];  Service: Endoscopy;  Laterality: N/A;   ERCP     FEMUR IM NAIL Left 06/10/2020   Procedure: INTRAMEDULLARY (IM) NAIL FEMORAL;  Surgeon: Barbarann Oneil BROCKS, MD;  Location: [facility];  Service: Orthopedics;  Laterality: Left;   FOREIGN BODY REMOVAL  01/31/2021   Procedure: FOREIGN BODY REMOVAL;  Surgeon: Wilhelmenia Aloha Raddle., MD;  Location: [facility];  Service: Gastroenterology;;   REMOVAL OF STONES  01/31/2021   Procedure: REMOVAL OF STONES;  Surgeon: Wilhelmenia Aloha Raddle., MD;  Location: [facility];  Service: Gastroenterology;;   ANNETT  01/31/2021   Procedure: ANNETT;  Surgeon: Wilhelmenia Aloha Raddle., MD;  Location: [facility];  Service: Gastroenterology;;   LAHOMA CHOLANGIOSCOPY N/A 01/31/2021   Procedure: DEBHOJDD CHOLANGIOSCOPY;  Surgeon: Wilhelmenia Aloha Raddle., MD;  Location: [facility];  Service: Gastroenterology;  Laterality: N/A;   STENT REMOVAL  01/31/2021   Procedure: STENT REMOVAL x2;  Surgeon: Wilhelmenia Aloha Raddle., MD;  Location: [facility];  Service: Gastroenterology;;    Allergies  Allergen Reactions   Red Dye #40 (Allura Red) Anaphylaxis     Outpatient Encounter Medications as of 01/07/2024  Medication Sig   loperamide (IMODIUM A-D) 2 MG tablet Take 2 mg by mouth as needed for diarrhea or loose stools.   LORazepam  (ATIVAN ) 0.5 MG tablet Take 0.5 mg by mouth every 12 (twelve) hours as needed for anxiety (Give if increased agitation 1 hour after taking Seroquel ).   mirtazapine  (REMERON ) 15 MG tablet Take 1 tablet (15 mg total) by mouth every morning.   ondansetron  (ZOFRAN ) 4 MG tablet Take 4 mg by mouth every 6 (six) hours as needed for nausea or vomiting.   QUEtiapine  (SEROQUEL ) 25 MG tablet Take 25 mg by mouth every 12 (twelve) hours as needed (increased agitation).   ZINC OXIDE, TOPICAL, 10 % CREA Apply 1 Application topically as needed.   No facility-administered encounter medications on file as of 01/07/2024.    Review of Systems  Unable to perform ROS: Dementia    Immunization History  Administered Date(s) Administered   INFLUENZA, HIGH DOSE SEASONAL PF 03/03/2018   Moderna Covid-19 Fall Seasonal Vaccine 24yrs & older 02/16/2023   Moderna Covid-19 Vaccine Bivalent Booster 58yrs & up 02/10/2021   Moderna Sars-Covid-2 Vaccination 05/05/2019, 06/02/2019, 02/27/2020   Unspecified SARS-COV-2 Vaccination 02/06/2022   Pertinent  Health Maintenance Due  Topic Date Due   DEXA SCAN  Never done   Influenza Vaccine  11/23/2023      12/09/2020   11:06 AM 12/09/2020    8:27 PM 12/10/2020    9:00 AM 01/31/2021    7:07 AM 01/02/2024   11:56 AM  Fall Risk  Falls in the past year?     1  Was there an injury with Fall?     1  Fall Risk Category Calculator     3  (RETIRED) Patient Fall Risk Level High fall risk  High fall risk  High fall risk  High fall risk    Patient at Risk for Falls Due to     History of fall(s);Impaired balance/gait  Fall risk Follow up     Falls evaluation completed;Education provided     Data saved with a previous flowsheet row definition   Functional Status Survey:    Vitals:   01/07/24 1059   BP: 129/84  Pulse: 72  Resp: 18  Temp: (!) 97.4 F (36.3 C)  SpO2: 95%  Weight: 65 lb 11.2 oz (29.8 kg)  Height: 5' 1 (1.549 m)   Body mass index is 12.41 kg/m. Physical Exam Vitals reviewed.  Constitutional:      Appearance: She is cachectic.  HENT:     Head: Normocephalic. Left periorbital erythema and laceration present.     Comments: Approx 4 cm linear skin laceration/tear to left temple/forehead, CDI, covered with telfa, no drainage or tenderness, mild bruising under left eye/cheek    Nose: Nose normal.     Mouth/Throat:     Mouth: Mucous membranes are moist.  Eyes:     General:        Right eye: No discharge.        Left eye: No discharge.  Cardiovascular:     Rate and Rhythm: Normal rate and regular rhythm.     Pulses: Normal pulses.     Heart sounds: Normal heart sounds.  Pulmonary:     Effort:  Pulmonary effort is normal.     Breath sounds: Normal breath sounds.  Abdominal:     Palpations: Abdomen is soft.  Musculoskeletal:     Cervical back: Neck supple.     Right lower leg: No edema.     Left lower leg: No edema.     Comments: Able to move extremities  Skin:    General: Skin is warm.     Capillary Refill: Capillary refill takes less than 2 seconds.  Neurological:     General: No focal deficit present.     Mental Status: She is alert.     Motor: Weakness present.     Gait: Gait abnormal.  Psychiatric:     Comments: Alert to self/familiar face, follows commands     Labs reviewed: Recent Labs    07/06/23 2023 07/07/23 1049 08/08/23 1614 09/18/23 0629  NA 137 138 134* 130*  K 2.5* 4.3 5.1 2.9*  CL 96* 103 96* 93*  CO2 30 27 30 29   GLUCOSE 93 90 100* 77  BUN 21 14 34* 34*  CREATININE 0.57 0.41* 0.53 0.65  CALCIUM 8.5* 8.2* 9.3 8.9  MG 1.0* 1.4* 1.8  --    Recent Labs    07/07/23 1049 08/08/23 1614 09/18/23 0629  AST 21 38 51*  ALT 17 40 73*  ALKPHOS 76 83 156*  BILITOT 1.3* 0.8 0.6  PROT 5.6* 6.9 6.5  ALBUMIN 3.0* 3.9 3.5    Recent Labs    07/06/23 2023 07/07/23 1049 08/08/23 1614 09/18/23 0629  WBC 8.8 8.3 7.9 7.7  NEUTROABS 5.8  --  5.7 4.8  HGB 12.5 11.7* 13.5 12.7  HCT 37.3 36.9 40.7 39.2  MCV 94.7 98.9 99.8 102.1*  PLT 242 237 235 174   No results found for: TSH No results found for: HGBA1C No results found for: CHOL, HDL, LDLCALC, LDLDIRECT, TRIG, CHOLHDL  Significant Diagnostic Results in last 30 days:  CT Head Wo Contrast Result Date: 01/05/2024 CLINICAL DATA:  Ataxia, cervical trauma; Ataxia, head trauma Arrives via [facility] from [facility]. Pt has a lac to the left side of her forehead. Hx of dementia. Pt. Denies pain EXAM: CT HEAD WITHOUT CONTRAST CT CERVICAL SPINE WITHOUT CONTRAST TECHNIQUE: Multidetector CT imaging of the head and cervical spine was performed following the standard protocol without intravenous contrast. Multiplanar CT image reconstructions of the cervical spine were also generated. RADIATION DOSE REDUCTION: This exam was performed according to the departmental dose-optimization program which includes automated exposure control, adjustment of the mA and/or kV according to patient size and/or use of iterative reconstruction technique. COMPARISON:  CT head and C-spine 09/18/2023. FINDINGS: CT HEAD FINDINGS Brain: Patchy and confluent areas of decreased attenuation are noted throughout the deep and periventricular white matter of the cerebral hemispheres bilaterally, compatible with chronic microvascular ischemic disease. No evidence of large-territorial acute infarction. No parenchymal hemorrhage. No mass lesion. No extra-axial collection. No mass effect or midline shift. No hydrocephalus. Basilar cisterns are patent. Vascular: No hyperdense vessel. Atherosclerotic calcifications are present within the cavernous internal carotid and vertebral arteries. Skull: No acute fracture or focal lesion. Sinuses/Orbits: Paranasal sinuses and mastoid air cells are clear.  The orbits are unremarkable. Other: None. CT CERVICAL SPINE FINDINGS Alignment: Normal. Skull base and vertebrae: No acute fracture. No aggressive appearing focal osseous lesion or focal pathologic process. Soft tissues and spinal canal: No prevertebral fluid or swelling. No visible canal hematoma. Upper chest: Biapical pleural/pulmonary scarring. Other: Atherosclerotic plaque of the  aortic arch and its main branches. IMPRESSION: 1. No acute intracranial abnormality. 2. No acute displaced fracture or traumatic listhesis of the cervical spine. 3.  Aortic Atherosclerosis (ICD10-I70.0). Electronically Signed   By: Morgane  Naveau M.D.   On: 01/05/2024 21:46   CT Cervical Spine Wo Contrast Result Date: 01/05/2024 CLINICAL DATA:  Ataxia, cervical trauma; Ataxia, head trauma Arrives via [facility] for fall from [facility]. Pt has a lac to the left side of her forehead. Hx of dementia. Pt. Denies pain EXAM: CT HEAD WITHOUT CONTRAST CT CERVICAL SPINE WITHOUT CONTRAST TECHNIQUE: Multidetector CT imaging of the head and cervical spine was performed following the standard protocol without intravenous contrast. Multiplanar CT image reconstructions of the cervical spine were also generated. RADIATION DOSE REDUCTION: This exam was performed according to the departmental dose-optimization program which includes automated exposure control, adjustment of the mA and/or kV according to patient size and/or use of iterative reconstruction technique. COMPARISON:  CT head and C-spine 09/18/2023. FINDINGS: CT HEAD FINDINGS Brain: Patchy and confluent areas of decreased attenuation are noted throughout the deep and periventricular white matter of the cerebral hemispheres bilaterally, compatible with chronic microvascular ischemic disease. No evidence of large-territorial acute infarction. No parenchymal hemorrhage. No mass lesion. No extra-axial collection. No mass effect or midline shift. No hydrocephalus. Basilar cisterns are patent.  Vascular: No hyperdense vessel. Atherosclerotic calcifications are present within the cavernous internal carotid and vertebral arteries. Skull: No acute fracture or focal lesion. Sinuses/Orbits: Paranasal sinuses and mastoid air cells are clear. The orbits are unremarkable. Other: None. CT CERVICAL SPINE FINDINGS Alignment: Normal. Skull base and vertebrae: No acute fracture. No aggressive appearing focal osseous lesion or focal pathologic process. Soft tissues and spinal canal: No prevertebral fluid or swelling. No visible canal hematoma. Upper chest: Biapical pleural/pulmonary scarring. Other: Atherosclerotic plaque of the aortic arch and its main branches. IMPRESSION: 1. No acute intracranial abnormality. 2. No acute displaced fracture or traumatic listhesis of the cervical spine. 3.  Aortic Atherosclerosis (ICD10-I70.0). Electronically Signed   By: Morgane  Naveau M.D.   On: 01/05/2024 21:46    Assessment/Plan 1. Laceration of scalp without foreign body, subsequent encounter (Primary) - 09/13 unwitnessed fall with head injury - ED evaluation for wound closure> sutures not recommended> CT head no acute issues - covered with Telfa - no sign of infection - consider steri strips in future   2. Frequent falls - see above  - start blue floor mats  - discussed falls safety  - if falls continue> consider sitter    Family/ staff Communication: plan discussed with patient and nurse  Labs/tests ordered:  none

## 2024-01-09 ENCOUNTER — Other Ambulatory Visit: Payer: Self-pay | Admitting: Orthopedic Surgery

## 2024-01-09 ENCOUNTER — Non-Acute Institutional Stay (SKILLED_NURSING_FACILITY): Payer: Self-pay | Admitting: Orthopedic Surgery

## 2024-01-09 ENCOUNTER — Encounter: Payer: Self-pay | Admitting: Orthopedic Surgery

## 2024-01-09 DIAGNOSIS — S0101XD Laceration without foreign body of scalp, subsequent encounter: Secondary | ICD-10-CM | POA: Diagnosis not present

## 2024-01-09 DIAGNOSIS — F03911 Unspecified dementia, unspecified severity, with agitation: Secondary | ICD-10-CM | POA: Diagnosis not present

## 2024-01-09 MED ORDER — MIRTAZAPINE 15 MG PO TABS: 15.0000 mg | ORAL_TABLET | Freq: Every day | ORAL | Status: AC

## 2024-01-09 MED ORDER — LORAZEPAM 0.5 MG PO TABS: 0.5000 mg | ORAL_TABLET | Freq: Two times a day (BID) | ORAL | 0 refills | Status: DC | PRN

## 2024-01-09 NOTE — Progress Notes (Signed)
 Location:  [facility] Room Number: 18 A Place of Service:  SNF [phone]) Provider:  Gil Greig BRAVO, NP   Patient Care Team: Alison Fredia CROME, MD as PCP - General (Internal Medicine)  Extended Emergency Contact Information Primary Emergency Contact: Alison Carpenter) Home Phone: [phone] Mobile Phone: [phone] Relation: Grandson Secondary Emergency Contact: [facility] Phone: [phone] Mobile Phone: [phone] Relation: Daughter  Code Status:  DNR Goals of care: Advanced Directive information    01/05/2024    9:06 PM  Advanced Directives  Does Patient Have a Medical Advance Directive? Yes  Type of Advance Directive Living will;Healthcare Power of Attorney  Does patient want to make changes to medical advance directive? No - Patient declined     Chief Complaint  Patient presents with   Acute Visit    Agitation     HPI:  Pt is a 89 y.o. female seen today for acute visit due to agitation.   She currently resides on the skilled nursing unit at [facility]. PMH: CVA, vascular dementia with behaviors, recurrent falls, h/o left femur fracture, biliary obstruction 2022,GERD, protein calorie malnutrition and hearing loss.   09/08 admitted to SNF from home. Followed by hospice. She continues to have periods of increased confusion. She is observed yelling at staff and exit seeking. 09/13 she was found on the floor with head injury. Nursing reports she is not using call bell for assistance. She has seroquel  and ativan  prn. Also on Remeron  at bedtime. Afebrile. Vitals stable.   09/13 she was found by nursing staff on the floor with laceration to left temporal. She was sent to ED for evaluation. Sutures not recommended for wound closure. Wound was cleaned and covered with adhesive dressing. CT head/spine negative for acute intracranial abnormality or fracture of cervical spine. She was discharged back to SNF.       Past Medical History:   Diagnosis Date   Complication of anesthesia    PONV   Past Surgical History:  Procedure Laterality Date   ABDOMINAL HYSTERECTOMY     APPENDECTOMY     BILIARY DILATION  01/31/2021   Procedure: BILIARY DILATION;  Surgeon: Alison Aloha Raddle., MD;  Location: [facility];  Service: Gastroenterology;;   BILIARY STENT PLACEMENT N/A 12/07/2020   Procedure: BILIARY STENT PLACEMENT;  Surgeon: Alison Groom, MD;  Location: [facility];  Service: Endoscopy;  Laterality: N/A;   CATARACT EXTRACTION, BILATERAL     CHOLECYSTECTOMY     ENDOSCOPIC RETROGRADE CHOLANGIOPANCREATOGRAPHY (ERCP) WITH PROPOFOL  N/A 01/31/2021   Procedure: ENDOSCOPIC RETROGRADE CHOLANGIOPANCREATOGRAPHY (ERCP) WITH PROPOFOL ;  Surgeon: Alison Aloha Raddle., MD;  Location: [facility];  Service: Gastroenterology;  Laterality: N/A;   ERCP N/A 12/07/2020   Procedure: ENDOSCOPIC RETROGRADE CHOLANGIOPANCREATOGRAPHY (ERCP);  Surgeon: Alison Groom, MD;  Location: [facility];  Service: Endoscopy;  Laterality: N/A;   ERCP     FEMUR IM NAIL Left 06/10/2020   Procedure: INTRAMEDULLARY (IM) NAIL FEMORAL;  Surgeon: Alison Oneil BROCKS, MD;  Location: [facility];  Service: Orthopedics;  Laterality: Left;   FOREIGN BODY REMOVAL  01/31/2021   Procedure: FOREIGN BODY REMOVAL;  Surgeon: Alison Aloha Raddle., MD;  Location: [facility];  Service: Gastroenterology;;   REMOVAL OF STONES  01/31/2021   Procedure: REMOVAL OF STONES;  Surgeon: Alison Aloha Raddle., MD;  Location: [facility];  Service: Gastroenterology;;   ANNETT  01/31/2021   Procedure: ANNETT;  Surgeon: Alison Aloha Raddle., MD;  Location: [facility];  Service: Gastroenterology;;   LAHOMA CHOLANGIOSCOPY N/A 01/31/2021  Procedure: SPYGLASS CHOLANGIOSCOPY;  Surgeon: Alison Aloha Raddle., MD;  Location: [facility];  Service: Gastroenterology;  Laterality: N/A;   STENT REMOVAL  01/31/2021   Procedure: STENT REMOVAL x2;  Surgeon: Alison Aloha Raddle., MD;  Location: [facility];  Service: Gastroenterology;;    Allergies  Allergen Reactions   Red Dye #40 (Allura Red) Anaphylaxis    Outpatient Encounter Medications as of 01/09/2024  Medication Sig   loperamide (IMODIUM A-D) 2 MG tablet Take 2 mg by mouth as needed for diarrhea or loose stools.   LORazepam  (ATIVAN ) 0.5 MG tablet Take 1 tablet (0.5 mg total) by mouth every 12 (twelve) hours as needed for up to 14 days for anxiety (Give if increased agitation 1 hour after taking Seroquel ).   mirtazapine  (REMERON ) 15 MG tablet Take 1 tablet (15 mg total) by mouth every morning.   ondansetron  (ZOFRAN ) 4 MG tablet Take 4 mg by mouth every 6 (six) hours as needed for nausea or vomiting.   QUEtiapine  (SEROQUEL ) 25 MG tablet Take 25 mg by mouth every 12 (twelve) hours as needed (increased agitation).   ZINC OXIDE, TOPICAL, 10 % CREA Apply 1 Application topically as needed.   No facility-administered encounter medications on file as of 01/09/2024.    Review of Systems  Unable to perform ROS: Dementia    Immunization History  Administered Date(s) Administered   INFLUENZA, HIGH DOSE SEASONAL PF 03/03/2018   Moderna Covid-19 Fall Seasonal Vaccine 51yrs & older 02/16/2023   Moderna Covid-19 Vaccine Bivalent Booster 46yrs & up 02/10/2021   Moderna Sars-Covid-2 Vaccination 05/05/2019, 06/02/2019, 02/27/2020   Unspecified SARS-COV-2 Vaccination 02/06/2022   Pertinent  Health Maintenance Due  Topic Date Due   DEXA SCAN  Never done   Influenza Vaccine  11/23/2023      12/09/2020    8:27 PM 12/10/2020    9:00 AM 01/31/2021    7:07 AM 01/02/2024   11:56 AM 01/07/2024   11:14 AM  Fall Risk  Falls in the past year?    1 1  Was there an injury with Fall?    1 1  Fall Risk Category Calculator    3 3  (RETIRED) Patient Fall Risk Level High fall risk  High fall risk  High fall risk     Patient at Risk for Falls Due to    History of fall(s);Impaired balance/gait History of fall(s);Impaired  balance/gait;Impaired mobility  Fall risk Follow up    Falls evaluation completed;Education provided Falls evaluation completed;Education provided;Falls prevention discussed     Data saved with a previous flowsheet row definition   Functional Status Survey:    Vitals:   01/09/24 1141  BP: 128/87  Pulse: 86  Resp: 17  Temp: 98.1 F (36.7 C)  SpO2: 96%  Weight: 65 lb 11.2 oz (29.8 kg)  Height: 5' 1 (1.549 m)   Body mass index is 12.41 kg/m. Physical Exam Vitals reviewed.  Constitutional:      General: She is not in acute distress.    Appearance: She is cachectic.  HENT:     Head: Normocephalic. No raccoon eyes or Battle's sign.     Comments: Left periorbital bruising, laceration to left forehead/temple CD, no infection.  Eyes:     General:        Right eye: No discharge.        Left eye: No discharge.  Cardiovascular:     Rate and Rhythm: Normal rate and regular rhythm.     Pulses: Normal pulses.  Heart sounds: Normal heart sounds.  Pulmonary:     Effort: Pulmonary effort is normal.     Breath sounds: Normal breath sounds.  Abdominal:     General: Bowel sounds are normal.     Palpations: Abdomen is soft.  Musculoskeletal:     Cervical back: Neck supple.     Right lower leg: No edema.     Left lower leg: No edema.  Skin:    General: Skin is warm.     Capillary Refill: Capillary refill takes less than 2 seconds.  Neurological:     General: No focal deficit present.     Mental Status: She is alert. Mental status is at baseline.     Motor: Weakness present.     Gait: Gait abnormal.  Psychiatric:        Mood and Affect: Mood normal.     Labs reviewed: Recent Labs    07/06/23 2023 07/07/23 1049 08/08/23 1614 09/18/23 0629  NA 137 138 134* 130*  K 2.5* 4.3 5.1 2.9*  CL 96* 103 96* 93*  CO2 30 27 30 29   GLUCOSE 93 90 100* 77  BUN 21 14 34* 34*  CREATININE 0.57 0.41* 0.53 0.65  CALCIUM 8.5* 8.2* 9.3 8.9  MG 1.0* 1.4* 1.8  --    Recent Labs     07/07/23 1049 08/08/23 1614 09/18/23 0629  AST 21 38 51*  ALT 17 40 73*  ALKPHOS 76 83 156*  BILITOT 1.3* 0.8 0.6  PROT 5.6* 6.9 6.5  ALBUMIN 3.0* 3.9 3.5   Recent Labs    07/06/23 2023 07/07/23 1049 08/08/23 1614 09/18/23 0629  WBC 8.8 8.3 7.9 7.7  NEUTROABS 5.8  --  5.7 4.8  HGB 12.5 11.7* 13.5 12.7  HCT 37.3 36.9 40.7 39.2  MCV 94.7 98.9 99.8 102.1*  PLT 242 237 235 174   No results found for: TSH No results found for: HGBA1C No results found for: CHOL, HDL, LDLCALC, LDLDIRECT, TRIG, CHOLHDL  Significant Diagnostic Results in last 30 days:  CT Head Wo Contrast Result Date: 01/05/2024 CLINICAL DATA:  Ataxia, cervical trauma; Ataxia, head trauma Arrives via [facility] from [facility]. Pt has a lac to the left side of her forehead. Hx of dementia. Pt. Denies pain EXAM: CT HEAD WITHOUT CONTRAST CT CERVICAL SPINE WITHOUT CONTRAST TECHNIQUE: Multidetector CT imaging of the head and cervical spine was performed following the standard protocol without intravenous contrast. Multiplanar CT image reconstructions of the cervical spine were also generated. RADIATION DOSE REDUCTION: This exam was performed according to the departmental dose-optimization program which includes automated exposure control, adjustment of the mA and/or kV according to patient size and/or use of iterative reconstruction technique. COMPARISON:  CT head and C-spine 09/18/2023. FINDINGS: CT HEAD FINDINGS Brain: Patchy and confluent areas of decreased attenuation are noted throughout the deep and periventricular white matter of the cerebral hemispheres bilaterally, compatible with chronic microvascular ischemic disease. No evidence of large-territorial acute infarction. No parenchymal hemorrhage. No mass lesion. No extra-axial collection. No mass effect or midline shift. No hydrocephalus. Basilar cisterns are patent. Vascular: No hyperdense vessel. Atherosclerotic calcifications are present within  the cavernous internal carotid and vertebral arteries. Skull: No acute fracture or focal lesion. Sinuses/Orbits: Paranasal sinuses and mastoid air cells are clear. The orbits are unremarkable. Other: None. CT CERVICAL SPINE FINDINGS Alignment: Normal. Skull base and vertebrae: No acute fracture. No aggressive appearing focal osseous lesion or focal pathologic process. Soft tissues and spinal canal: No  prevertebral fluid or swelling. No visible canal hematoma. Upper chest: Biapical pleural/pulmonary scarring. Other: Atherosclerotic plaque of the aortic arch and its main branches. IMPRESSION: 1. No acute intracranial abnormality. 2. No acute displaced fracture or traumatic listhesis of the cervical spine. 3.  Aortic Atherosclerosis (ICD10-I70.0). Electronically Signed   By: Morgane  Naveau M.D.   On: 01/05/2024 21:46   CT Cervical Spine Wo Contrast Result Date: 01/05/2024 CLINICAL DATA:  Ataxia, cervical trauma; Ataxia, head trauma Arrives via [facility] for fall from [facility]. Pt has a lac to the left side of her forehead. Hx of dementia. Pt. Denies pain EXAM: CT HEAD WITHOUT CONTRAST CT CERVICAL SPINE WITHOUT CONTRAST TECHNIQUE: Multidetector CT imaging of the head and cervical spine was performed following the standard protocol without intravenous contrast. Multiplanar CT image reconstructions of the cervical spine were also generated. RADIATION DOSE REDUCTION: This exam was performed according to the departmental dose-optimization program which includes automated exposure control, adjustment of the mA and/or kV according to patient size and/or use of iterative reconstruction technique. COMPARISON:  CT head and C-spine 09/18/2023. FINDINGS: CT HEAD FINDINGS Brain: Patchy and confluent areas of decreased attenuation are noted throughout the deep and periventricular white matter of the cerebral hemispheres bilaterally, compatible with chronic microvascular ischemic disease. No evidence of large-territorial  acute infarction. No parenchymal hemorrhage. No mass lesion. No extra-axial collection. No mass effect or midline shift. No hydrocephalus. Basilar cisterns are patent. Vascular: No hyperdense vessel. Atherosclerotic calcifications are present within the cavernous internal carotid and vertebral arteries. Skull: No acute fracture or focal lesion. Sinuses/Orbits: Paranasal sinuses and mastoid air cells are clear. The orbits are unremarkable. Other: None. CT CERVICAL SPINE FINDINGS Alignment: Normal. Skull base and vertebrae: No acute fracture. No aggressive appearing focal osseous lesion or focal pathologic process. Soft tissues and spinal canal: No prevertebral fluid or swelling. No visible canal hematoma. Upper chest: Biapical pleural/pulmonary scarring. Other: Atherosclerotic plaque of the aortic arch and its main branches. IMPRESSION: 1. No acute intracranial abnormality. 2. No acute displaced fracture or traumatic listhesis of the cervical spine. 3.  Aortic Atherosclerosis (ICD10-I70.0). Electronically Signed   By: Morgane  Naveau M.D.   On: 01/05/2024 21:46    Assessment/Plan 1. Agitation due to dementia (HCC) (Primary) - ongoing> yelling and exit seeking  - 09/08 moved to SNF - start Seroquel  low dose q AM for behaviors - change Remeron  to at bedtime - cont ativan  BID PRN  2. Laceration of scalp without foreign body, subsequent encounter - see above - 09/13 unwitnessed fall - sent to ED for wound closure> covered with Telfa - wound stable, no infection  - recommend wetting telfa before removing, consider steri strips in future    Family/ staff Communication: plan discussed with patient and nurse  Labs/tests ordered:  none

## 2024-01-18 ENCOUNTER — Non-Acute Institutional Stay (SKILLED_NURSING_FACILITY): Payer: Self-pay | Admitting: Orthopedic Surgery

## 2024-01-18 ENCOUNTER — Encounter: Payer: Self-pay | Admitting: Orthopedic Surgery

## 2024-01-18 DIAGNOSIS — S0101XD Laceration without foreign body of scalp, subsequent encounter: Secondary | ICD-10-CM | POA: Diagnosis not present

## 2024-01-18 DIAGNOSIS — H6123 Impacted cerumen, bilateral: Secondary | ICD-10-CM

## 2024-01-18 DIAGNOSIS — F03911 Unspecified dementia, unspecified severity, with agitation: Secondary | ICD-10-CM | POA: Diagnosis not present

## 2024-01-18 MED ORDER — DIVALPROEX SODIUM 125 MG PO CSDR: 125.0000 mg | DELAYED_RELEASE_CAPSULE | Freq: Every day | ORAL | Status: DC

## 2024-01-18 MED ORDER — CARBAMIDE PEROXIDE 6.5 % OT SOLN: 5.0000 [drp] | Freq: Two times a day (BID) | OTIC | Status: AC

## 2024-01-18 NOTE — Progress Notes (Signed)
 Location:  [facility] Room Number: 18/A Place of Service:  SNF [phone]) Provider:  Greig FORBES Cluster, NP   Charlanne Fredia CROME, MD  Patient Care Team: Charlanne Fredia CROME, MD as PCP - General (Internal Medicine)  Extended Emergency Contact Information Primary Emergency Contact: Tressa Lamar Kings) Home Phone: [phone] Mobile Phone: [phone] Relation: Grandson Secondary Emergency Contact: [facility] Phone: [phone] Mobile Phone: [phone] Relation: Daughter  Code Status:  DNR Goals of care: Advanced Directive information    01/05/2024    9:06 PM  Advanced Directives  Does Patient Have a Medical Advance Directive? Yes  Type of Advance Directive Living will;Healthcare Power of Attorney  Does patient want to make changes to medical advance directive? No - Patient declined     Chief Complaint  Patient presents with   Acute Visit    Agitation, HOH    HPI:  Pt is a 89 y.o. female seen today for acute visit due to agitation.   She currently resides on the skilled nursing unit at [facility]. PMH: CVA, vascular dementia with behaviors, recurrent falls, h/o left femur fracture, biliary obstruction 2022,GERD, protein calorie malnutrition and hearing loss.   Followed by hospice.   Poor historian due to dementia. She continues to have periods of increased confusion. Nursing also reports lack of sleep during the night and sleeping more during day. 09/17 Remeron  changed to evening administration and Seroquel  started in AM. She has had 2 falls with head injury since admission to [facility] 09/08. She continues to be very HOH during encounter. Afebrile. Vitals stable.    Past Medical History:  Diagnosis Date   Complication of anesthesia    PONV   Past Surgical History:  Procedure Laterality Date   ABDOMINAL HYSTERECTOMY     APPENDECTOMY     BILIARY DILATION  01/31/2021   Procedure: BILIARY DILATION;  Surgeon: Wilhelmenia Aloha Raddle., MD;  Location: [facility];  Service: Gastroenterology;;   BILIARY STENT PLACEMENT N/A 12/07/2020   Procedure: BILIARY STENT PLACEMENT;  Surgeon: Charlanne Groom, MD;  Location: [facility];  Service: Endoscopy;  Laterality: N/A;   CATARACT EXTRACTION, BILATERAL     CHOLECYSTECTOMY     ENDOSCOPIC RETROGRADE CHOLANGIOPANCREATOGRAPHY (ERCP) WITH PROPOFOL  N/A 01/31/2021   Procedure: ENDOSCOPIC RETROGRADE CHOLANGIOPANCREATOGRAPHY (ERCP) WITH PROPOFOL ;  Surgeon: Wilhelmenia Aloha Raddle., MD;  Location: [facility];  Service: Gastroenterology;  Laterality: N/A;   ERCP N/A 12/07/2020   Procedure: ENDOSCOPIC RETROGRADE CHOLANGIOPANCREATOGRAPHY (ERCP);  Surgeon: Charlanne Groom, MD;  Location: [facility];  Service: Endoscopy;  Laterality: N/A;   ERCP     FEMUR IM NAIL Left 06/10/2020   Procedure: INTRAMEDULLARY (IM) NAIL FEMORAL;  Surgeon: Barbarann Oneil BROCKS, MD;  Location: [facility];  Service: Orthopedics;  Laterality: Left;   FOREIGN BODY REMOVAL  01/31/2021   Procedure: FOREIGN BODY REMOVAL;  Surgeon: Wilhelmenia Aloha Raddle., MD;  Location: [facility];  Service: Gastroenterology;;   REMOVAL OF STONES  01/31/2021   Procedure: REMOVAL OF STONES;  Surgeon: Wilhelmenia Aloha Raddle., MD;  Location: [facility];  Service: Gastroenterology;;   ANNETT  01/31/2021   Procedure: ANNETT;  Surgeon: Wilhelmenia Aloha Raddle., MD;  Location: [facility];  Service: Gastroenterology;;   LAHOMA CHOLANGIOSCOPY N/A 01/31/2021   Procedure: DEBHOJDD CHOLANGIOSCOPY;  Surgeon: Wilhelmenia Aloha Raddle., MD;  Location: [facility];  Service: Gastroenterology;  Laterality: N/A;   STENT REMOVAL  01/31/2021   Procedure: STENT REMOVAL x2;  Surgeon: Wilhelmenia Aloha Raddle., MD;  Location: [facility];  Service: Gastroenterology;;  Allergies  Allergen Reactions   Red Dye #40 (Allura Red) Anaphylaxis    Outpatient Encounter Medications as of 01/18/2024  Medication Sig   loperamide (IMODIUM A-D) 2 MG tablet Take 2 mg by mouth as  needed for diarrhea or loose stools.   LORazepam  (ATIVAN ) 0.5 MG tablet Take 1 tablet (0.5 mg total) by mouth every 12 (twelve) hours as needed for up to 14 days for anxiety (Give if increased agitation 1 hour after taking Seroquel ).   mirtazapine  (REMERON ) 15 MG tablet Take 1 tablet (15 mg total) by mouth at bedtime.   ondansetron  (ZOFRAN ) 4 MG tablet Take 4 mg by mouth every 6 (six) hours as needed for nausea or vomiting.   QUEtiapine  (SEROQUEL ) 25 MG tablet Take 12.5 mg by mouth every morning.   ZINC OXIDE, TOPICAL, 10 % CREA Apply 1 Application topically as needed.   No facility-administered encounter medications on file as of 01/18/2024.    Review of Systems  Unable to perform ROS: Dementia    Immunization History  Administered Date(s) Administered   INFLUENZA, HIGH DOSE SEASONAL PF 03/03/2018   Moderna Covid-19 Fall Seasonal Vaccine 20yrs & older 02/16/2023   Moderna Covid-19 Vaccine Bivalent Booster 49yrs & up 02/10/2021   Moderna Sars-Covid-2 Vaccination 05/05/2019, 06/02/2019, 02/27/2020   Unspecified SARS-COV-2 Vaccination 02/06/2022   Pertinent  Health Maintenance Due  Topic Date Due   DEXA SCAN  Never done   Influenza Vaccine  11/23/2023      12/10/2020    9:00 AM 01/31/2021    7:07 AM 01/02/2024   11:56 AM 01/07/2024   11:14 AM 01/09/2024    3:40 PM  Fall Risk  Falls in the past year?   1 1 1   Was there an injury with Fall?   1 1 1   Fall Risk Category Calculator   3 3 2   (RETIRED) Patient Fall Risk Level High fall risk  High fall risk      Patient at Risk for Falls Due to   History of fall(s);Impaired balance/gait History of fall(s);Impaired balance/gait;Impaired mobility History of fall(s);Impaired balance/gait  Fall risk Follow up   Falls evaluation completed;Education provided Falls evaluation completed;Education provided;Falls prevention discussed Falls evaluation completed;Education provided;Falls prevention discussed     Data saved with a previous flowsheet row  definition   Functional Status Survey:    Vitals:   01/18/24 1551  BP: (!) 96/59  Pulse: 86  Resp: 16  Temp: 98.5 F (36.9 C)  SpO2: 96%  Weight: 67 lb 1.6 oz (30.4 kg)  Height: 5' 1 (1.549 m)   Body mass index is 12.68 kg/m. Physical Exam Vitals reviewed.  Constitutional:      General: She is not in acute distress.    Appearance: She is cachectic.  HENT:     Head: Normocephalic.     Comments: Wound to left forehead, CDI    Right Ear: There is impacted cerumen.     Left Ear: There is impacted cerumen.     Nose: Nose normal.     Mouth/Throat:     Mouth: Mucous membranes are moist.  Eyes:     General:        Right eye: No discharge.        Left eye: No discharge.  Cardiovascular:     Rate and Rhythm: Normal rate and regular rhythm.     Pulses: Normal pulses.     Heart sounds: Normal heart sounds.  Pulmonary:     Effort: Pulmonary effort is  normal.     Breath sounds: Normal breath sounds.  Abdominal:     General: Bowel sounds are normal.     Palpations: Abdomen is soft.  Musculoskeletal:     Cervical back: Neck supple.     Right lower leg: No edema.     Left lower leg: No edema.  Skin:    General: Skin is warm.     Capillary Refill: Capillary refill takes less than 2 seconds.  Neurological:     General: No focal deficit present.     Mental Status: She is alert. Mental status is at baseline.     Gait: Gait abnormal.  Psychiatric:        Mood and Affect: Mood normal.     Comments: Alert to self/person, follows commands     Labs reviewed: Recent Labs    07/06/23 2023 07/07/23 1049 08/08/23 1614 09/18/23 0629  NA 137 138 134* 130*  K 2.5* 4.3 5.1 2.9*  CL 96* 103 96* 93*  CO2 30 27 30 29   GLUCOSE 93 90 100* 77  BUN 21 14 34* 34*  CREATININE 0.57 0.41* 0.53 0.65  CALCIUM 8.5* 8.2* 9.3 8.9  MG 1.0* 1.4* 1.8  --    Recent Labs    07/07/23 1049 08/08/23 1614 09/18/23 0629  AST 21 38 51*  ALT 17 40 73*  ALKPHOS 76 83 156*  BILITOT 1.3* 0.8  0.6  PROT 5.6* 6.9 6.5  ALBUMIN 3.0* 3.9 3.5   Recent Labs    07/06/23 2023 07/07/23 1049 08/08/23 1614 09/18/23 0629  WBC 8.8 8.3 7.9 7.7  NEUTROABS 5.8  --  5.7 4.8  HGB 12.5 11.7* 13.5 12.7  HCT 37.3 36.9 40.7 39.2  MCV 94.7 98.9 99.8 102.1*  PLT 242 237 235 174   No results found for: TSH No results found for: HGBA1C No results found for: CHOL, HDL, LDLCALC, LDLDIRECT, TRIG, CHOLHDL  Significant Diagnostic Results in last 30 days:  CT Head Wo Contrast Result Date: 01/05/2024 CLINICAL DATA:  Ataxia, cervical trauma; Ataxia, head trauma [facility]. Pt has a lac to the left side of her forehead. Hx of dementia. Pt. Denies pain EXAM: CT HEAD WITHOUT CONTRAST CT CERVICAL SPINE WITHOUT CONTRAST TECHNIQUE: Multidetector CT imaging of the head and cervical spine was performed following the standard protocol without intravenous contrast. Multiplanar CT image reconstructions of the cervical spine were also generated. RADIATION DOSE REDUCTION: This exam was performed according to the departmental dose-optimization program which includes automated exposure control, adjustment of the mA and/or kV according to patient size and/or use of iterative reconstruction technique. COMPARISON:  CT head and C-spine 09/18/2023. FINDINGS: CT HEAD FINDINGS Brain: Patchy and confluent areas of decreased attenuation are noted throughout the deep and periventricular white matter of the cerebral hemispheres bilaterally, compatible with chronic microvascular ischemic disease. No evidence of large-territorial acute infarction. No parenchymal hemorrhage. No mass lesion. No extra-axial collection. No mass effect or midline shift. No hydrocephalus. Basilar cisterns are patent. Vascular: No hyperdense vessel. Atherosclerotic calcifications are present within the cavernous internal carotid and vertebral arteries. Skull: No acute fracture or focal lesion. Sinuses/Orbits:  Paranasal sinuses and mastoid air cells are clear. The orbits are unremarkable. Other: None. CT CERVICAL SPINE FINDINGS Alignment: Normal. Skull base and vertebrae: No acute fracture. No aggressive appearing focal osseous lesion or focal pathologic process. Soft tissues and spinal canal: No prevertebral fluid or swelling. No visible canal hematoma. Upper chest: Biapical pleural/pulmonary  scarring. Other: Atherosclerotic plaque of the aortic arch and its main branches. IMPRESSION: 1. No acute intracranial abnormality. 2. No acute displaced fracture or traumatic listhesis of the cervical spine. 3.  Aortic Atherosclerosis (ICD10-I70.0). Electronically Signed   By: Morgane  Naveau M.D.   On: 01/05/2024 21:46   CT Cervical Spine Wo Contrast Result Date: 01/05/2024 CLINICAL DATA:  Ataxia, cervical trauma; Ataxia, head trauma Arrives [facility] from [facility]. Pt has a lac to the left side of her forehead. Hx of dementia. Pt. Denies pain EXAM: CT HEAD WITHOUT CONTRAST CT CERVICAL SPINE WITHOUT CONTRAST TECHNIQUE: Multidetector CT imaging of the head and cervical spine was performed following the standard protocol without intravenous contrast. Multiplanar CT image reconstructions of the cervical spine were also generated. RADIATION DOSE REDUCTION: This exam was performed according to the departmental dose-optimization program which includes automated exposure control, adjustment of the mA and/or kV according to patient size and/or use of iterative reconstruction technique. COMPARISON:  CT head and C-spine 09/18/2023. FINDINGS: CT HEAD FINDINGS Brain: Patchy and confluent areas of decreased attenuation are noted throughout the deep and periventricular white matter of the cerebral hemispheres bilaterally, compatible with chronic microvascular ischemic disease. No evidence of large-territorial acute infarction. No parenchymal hemorrhage. No mass lesion. No extra-axial collection. No mass effect or midline  shift. No hydrocephalus. Basilar cisterns are patent. Vascular: No hyperdense vessel. Atherosclerotic calcifications are present within the cavernous internal carotid and vertebral arteries. Skull: No acute fracture or focal lesion. Sinuses/Orbits: Paranasal sinuses and mastoid air cells are clear. The orbits are unremarkable. Other: None. CT CERVICAL SPINE FINDINGS Alignment: Normal. Skull base and vertebrae: No acute fracture. No aggressive appearing focal osseous lesion or focal pathologic process. Soft tissues and spinal canal: No prevertebral fluid or swelling. No visible canal hematoma. Upper chest: Biapical pleural/pulmonary scarring. Other: Atherosclerotic plaque of the aortic arch and its main branches. IMPRESSION: 1. No acute intracranial abnormality. 2. No acute displaced fracture or traumatic listhesis of the cervical spine. 3.  Aortic Atherosclerosis (ICD10-I70.0). Electronically Signed   By: Morgane  Naveau M.D.   On: 01/05/2024 21:46    Assessment/Plan 1. Bilateral impacted cerumen (Primary) - carbamide peroxide (DEBROX) 6.5 % OTIC solution; Place 5 drops into both ears 2 (two) times daily for 5 days.  2. Agitation due to dementia [facility]) - ongoing - not sleeping at night, drowsiness during day - moved to [facility] 09/08 - ?low dose Seroquel  causing increased drowsiness during day - will try low dose Depakote > discussed with hospice - cont Remeron  at bedtime - cont ativan  prn - divalproex  (DEPAKOTE  SPRINKLES) 125 MG capsule; Take 1 capsule (125 mg total) by mouth daily.  3. Laceration of scalp without foreign body, subsequent encounter - 09/13 unwitnessed fall - sent to ED for wound closure> covered with Telfa - wound stable, no infection  - wet telfa and change every 3-4 days or if soiled    Family/ staff Communication: plan discussed with daughter and nurse  Labs/tests ordered:  none

## 2024-01-22 ENCOUNTER — Encounter: Payer: Self-pay | Admitting: Adult Health

## 2024-01-22 DIAGNOSIS — F03911 Unspecified dementia, unspecified severity, with agitation: Secondary | ICD-10-CM

## 2024-01-22 MED ORDER — LORAZEPAM 2 MG/ML PO CONC: ORAL | 0 refills | Status: DC

## 2024-01-22 NOTE — Progress Notes (Signed)
 This encounter was created in error - please disregard.

## 2024-01-31 ENCOUNTER — Non-Acute Institutional Stay (HOSPITAL_COMMUNITY): Payer: Self-pay | Admitting: Internal Medicine

## 2024-01-31 ENCOUNTER — Encounter (HOSPITAL_COMMUNITY): Payer: Self-pay | Admitting: Internal Medicine

## 2024-01-31 DIAGNOSIS — M545 Low back pain, unspecified: Secondary | ICD-10-CM

## 2024-01-31 DIAGNOSIS — F01B18 Vascular dementia, moderate, with other behavioral disturbance: Secondary | ICD-10-CM

## 2024-01-31 DIAGNOSIS — R634 Abnormal weight loss: Secondary | ICD-10-CM

## 2024-01-31 NOTE — Progress Notes (Unsigned)
 Location:  [facility]  Nursing Home Room Number: N18-A Place of Service:  SNF [phone]) Provider:  Charlanne Fredia CROME, MD  Charlanne Fredia CROME, MD  Patient Care Team: Charlanne Fredia CROME, MD as PCP - General (Internal Medicine)  Extended Emergency Contact Information Primary Emergency Contact: Tressa Lamar Kings) Home Phone: [phone] Mobile Phone: [phone] Relation: Grandson Secondary Emergency Contact: [facility] Phone: [phone] Mobile Phone: [phone] Relation: Daughter  Code Status:  DNR  Goals of care: Advanced Directive information    01/31/2024   12:49 PM  Advanced Directives  Does Patient Have a Medical Advance Directive? Yes  Type of Advance Directive Living will;Healthcare Power of [place];Out of facility DNR (pink MOST or yellow form)  Does patient want to make changes to medical advance directive? No - Patient declined  Pre-existing out of facility DNR order (yellow form or pink MOST form) Yellow form placed in chart (order not valid for inpatient use)     Chief Complaint  Patient presents with   Acute Visit    Behavior     HPI:  Pt is a 89 y.o. female seen today for an acute visit for Behaviors especially at night   Patient was living at home with her Daughter Is now admitted under hospice care in [facility]   Patient has h/o CVA , Vascular Dementia, Recurent falls Hearing and Vision Loss and Agitation  Daughter in the room Per Nurses patient screams at night  Agitated Resists Care Other residents are complaining about her   Patient unable to give history She was upset as she had to go to [facility] She is still able to get up from her recliner with assist  She is enrolled in Hospice Since she was spitting meds she was started on Ativan  gel Per Pharmacy Nurse have not been using it properly and it has not been effective also   Past Medical History:  Diagnosis Date   Complication of anesthesia    PONV   Past Surgical History:   Procedure Laterality Date   ABDOMINAL HYSTERECTOMY     APPENDECTOMY     BILIARY DILATION  01/31/2021   Procedure: BILIARY DILATION;  Surgeon: Wilhelmenia Aloha Raddle., MD;  Location: [facility];  Service: Gastroenterology;;   BILIARY STENT PLACEMENT N/A 12/07/2020   Procedure: BILIARY STENT PLACEMENT;  Surgeon: Charlanne Groom, MD;  Location: [facility];  Service: Endoscopy;  Laterality: N/A;   CATARACT EXTRACTION, BILATERAL     CHOLECYSTECTOMY     ENDOSCOPIC RETROGRADE CHOLANGIOPANCREATOGRAPHY (ERCP) WITH PROPOFOL  N/A 01/31/2021   Procedure: ENDOSCOPIC RETROGRADE CHOLANGIOPANCREATOGRAPHY (ERCP) WITH PROPOFOL ;  Surgeon: Wilhelmenia Aloha Raddle., MD;  Location: [facility];  Service: Gastroenterology;  Laterality: N/A;   ERCP N/A 12/07/2020   Procedure: ENDOSCOPIC RETROGRADE CHOLANGIOPANCREATOGRAPHY (ERCP);  Surgeon: Charlanne Groom, MD;  Location: [facility];  Service: Endoscopy;  Laterality: N/A;   ERCP     FEMUR IM NAIL Left 06/10/2020   Procedure: INTRAMEDULLARY (IM) NAIL FEMORAL;  Surgeon: Barbarann Oneil BROCKS, MD;  Location: [facility];  Service: Orthopedics;  Laterality: Left;   FOREIGN BODY REMOVAL  01/31/2021   Procedure: FOREIGN BODY REMOVAL;  Surgeon: Wilhelmenia Aloha Raddle., MD;  Location: [facility];  Service: Gastroenterology;;   REMOVAL OF STONES  01/31/2021   Procedure: REMOVAL OF STONES;  Surgeon: Wilhelmenia Aloha Raddle., MD;  Location: [facility];  Service: Gastroenterology;;   ANNETT  01/31/2021   Procedure: ANNETT;  Surgeon: Wilhelmenia Aloha Raddle., MD;  Location: [facility];  Service: Gastroenterology;;   LAHOMA CHOLANGIOSCOPY N/A 01/31/2021  Procedure: SPYGLASS CHOLANGIOSCOPY;  Surgeon: Wilhelmenia Aloha Raddle., MD;  Location: [facility];  Service: Gastroenterology;  Laterality: N/A;   STENT REMOVAL  01/31/2021   Procedure: STENT REMOVAL x2;  Surgeon: Wilhelmenia Aloha Raddle., MD;  Location: [facility];  Service: Gastroenterology;;    Allergies   Allergen Reactions   Red Dye #40 (Allura Red) Anaphylaxis    Allergies as of 01/31/2024       Reactions   Red Dye #40 (allura Red) Anaphylaxis        Medication List        Accurate as of January 31, 2024 12:51 PM. If you have any questions, ask your nurse or doctor.          acetaminophen  650 MG CR tablet Commonly known as: TYLENOL  Take 650 mg by mouth 2 (two) times daily. Medication can be crushed into puree.   divalproex  125 MG capsule Commonly known as: Depakote  Sprinkles Take 1 capsule (125 mg total) by mouth daily.   loperamide 2 MG tablet Commonly known as: IMODIUM A-D Take 2 mg by mouth as needed for diarrhea or loose stools.   LORazepam  2 MG/ML concentrated solution Commonly known as: ATIVAN  Apply 0.5 mg/ml gel topically to skin in the morning and at bedtime   mirtazapine  15 MG tablet Commonly known as: REMERON  Take 1 tablet (15 mg total) by mouth at bedtime.   ondansetron  4 MG tablet Commonly known as: ZOFRAN  Take 4 mg by mouth every 6 (six) hours as needed for nausea or vomiting.   ZINC OXIDE (TOPICAL) 10 % Crea Apply 1 Application topically as needed.        Review of Systems  Unable to perform ROS: Dementia    Immunization History  Administered Date(s) Administered   INFLUENZA, HIGH DOSE SEASONAL PF 03/03/2018   Influenza-Unspecified 01/29/2024   Moderna Covid-19 Fall Seasonal Vaccine 81yrs & older 02/16/2023   Moderna Covid-19 Vaccine Bivalent Booster 66yrs & up 02/10/2021   Moderna Sars-Covid-2 Vaccination 05/05/2019, 06/02/2019, 02/27/2020   Unspecified SARS-COV-2 Vaccination 02/06/2022   Pertinent  Health Maintenance Due  Topic Date Due   DEXA SCAN  Never done   Influenza Vaccine  Completed      12/10/2020    9:00 AM 01/31/2021    7:07 AM 01/02/2024   11:56 AM 01/07/2024   11:14 AM 01/09/2024    3:40 PM  Fall Risk  Falls in the past year?   1 1 1   Was there an injury with Fall?   1 1 1   Fall Risk Category Calculator   3 3 2    (RETIRED) Patient Fall Risk Level High fall risk  High fall risk      Patient at Risk for Falls Due to   History of fall(s);Impaired balance/gait History of fall(s);Impaired balance/gait;Impaired mobility History of fall(s);Impaired balance/gait  Fall risk Follow up   Falls evaluation completed;Education provided Falls evaluation completed;Education provided;Falls prevention discussed Falls evaluation completed;Education provided;Falls prevention discussed     Data saved with a previous flowsheet row definition   Functional Status Survey:    Vitals:   01/31/24 1245  BP: 116/76  Pulse: 86  Resp: 18  Temp: 97.6 F (36.4 C)  SpO2: 95%  Weight: 68 lb 4.8 oz (31 kg)  Height: 5' 1 (1.549 m)   Body mass index is 12.91 kg/m. Physical Exam Vitals reviewed.  Constitutional:      Comments: Frail  HENT:     Head: Normocephalic.     Nose: Nose normal.  Mouth/Throat:     Mouth: Mucous membranes are moist.  Eyes:     Pupils: Pupils are equal, round, and reactive to light.  Musculoskeletal:        General: No swelling.     Cervical back: Neck supple.  Skin:    General: Skin is warm and dry.  Neurological:     General: No focal deficit present.     Mental Status: She is alert.  Psychiatric:        Mood and Affect: Mood normal.     Labs reviewed: Recent Labs    07/06/23 2023 07/07/23 1049 08/08/23 1614 09/18/23 0629  NA 137 138 134* 130*  K 2.5* 4.3 5.1 2.9*  CL 96* 103 96* 93*  CO2 30 27 30 29   GLUCOSE 93 90 100* 77  BUN 21 14 34* 34*  CREATININE 0.57 0.41* 0.53 0.65  CALCIUM 8.5* 8.2* 9.3 8.9  MG 1.0* 1.4* 1.8  --    Recent Labs    07/07/23 1049 08/08/23 1614 09/18/23 0629  AST 21 38 51*  ALT 17 40 73*  ALKPHOS 76 83 156*  BILITOT 1.3* 0.8 0.6  PROT 5.6* 6.9 6.5  ALBUMIN 3.0* 3.9 3.5   Recent Labs    07/06/23 2023 07/07/23 1049 08/08/23 1614 09/18/23 0629  WBC 8.8 8.3 7.9 7.7  NEUTROABS 5.8  --  5.7 4.8  HGB 12.5 11.7* 13.5 12.7  HCT 37.3 36.9  40.7 39.2  MCV 94.7 98.9 99.8 102.1*  PLT 242 237 235 174   No results found for: TSH No results found for: HGBA1C No results found for: CHOL, HDL, LDLCALC, LDLDIRECT, TRIG, CHOLHDL  Significant Diagnostic Results in last 30 days:  CT Head Wo Contrast Result Date: 01/05/2024 CLINICAL DATA:  Ataxia, cervical trauma; Ataxia, head trauma [facility]rives [facility]. Pt has a lac to the left side of her forehead. Hx of dementia. Pt. Denies pain EXAM: CT HEAD WITHOUT CONTRAST CT CERVICAL SPINE WITHOUT CONTRAST TECHNIQUE: Multidetector CT imaging of the head and cervical spine was performed following the standard protocol without intravenous contrast. Multiplanar CT image reconstructions of the cervical spine were also generated. RADIATION DOSE REDUCTION: This exam was performed according to the departmental dose-optimization program which includes automated exposure control, adjustment of the mA and/or kV according to patient size and/or use of iterative reconstruction technique. COMPARISON:  CT head and C-spine 09/18/2023. FINDINGS: CT HEAD FINDINGS Brain: Patchy and confluent areas of decreased attenuation are noted throughout the deep and periventricular white matter of the cerebral hemispheres bilaterally, compatible with chronic microvascular ischemic disease. No evidence of large-territorial acute infarction. No parenchymal hemorrhage. No mass lesion. No extra-axial collection. No mass effect or midline shift. No hydrocephalus. Basilar cisterns are patent. Vascular: No hyperdense vessel. Atherosclerotic calcifications are present within the cavernous internal carotid and vertebral arteries. Skull: No acute fracture or focal lesion. Sinuses/Orbits: Paranasal sinuses and mastoid air cells are clear. The orbits are unremarkable. Other: None. CT CERVICAL SPINE FINDINGS Alignment: Normal. Skull base and vertebrae: No acute fracture. No aggressive appearing focal osseous  lesion or focal pathologic process. Soft tissues and spinal canal: No prevertebral fluid or swelling. No visible canal hematoma. Upper chest: Biapical pleural/pulmonary scarring. Other: Atherosclerotic plaque of the aortic arch and its main branches. IMPRESSION: 1. No acute intracranial abnormality. 2. No acute displaced fracture or traumatic listhesis of the cervical spine. 3.  Aortic Atherosclerosis (ICD10-I70.0). Electronically Signed   By: Morgane  Naveau M.D.  On: 01/05/2024 21:46   CT Cervical Spine Wo Contrast Result Date: 01/05/2024 CLINICAL DATA:  Ataxia, cervical trauma; Ataxia, head trauma Arrives via [facility] from [facility]. Pt has a lac to the left side of her forehead. Hx of dementia. Pt. Denies pain EXAM: CT HEAD WITHOUT CONTRAST CT CERVICAL SPINE WITHOUT CONTRAST TECHNIQUE: Multidetector CT imaging of the head and cervical spine was performed following the standard protocol without intravenous contrast. Multiplanar CT image reconstructions of the cervical spine were also generated. RADIATION DOSE REDUCTION: This exam was performed according to the departmental dose-optimization program which includes automated exposure control, adjustment of the mA and/or kV according to patient size and/or use of iterative reconstruction technique. COMPARISON:  CT head and C-spine 09/18/2023. FINDINGS: CT HEAD FINDINGS Brain: Patchy and confluent areas of decreased attenuation are noted throughout the deep and periventricular white matter of the cerebral hemispheres bilaterally, compatible with chronic microvascular ischemic disease. No evidence of large-territorial acute infarction. No parenchymal hemorrhage. No mass lesion. No extra-axial collection. No mass effect or midline shift. No hydrocephalus. Basilar cisterns are patent. Vascular: No hyperdense vessel. Atherosclerotic calcifications are present within the cavernous internal carotid and vertebral arteries. Skull: No acute fracture or focal  lesion. Sinuses/Orbits: Paranasal sinuses and mastoid air cells are clear. The orbits are unremarkable. Other: None. CT CERVICAL SPINE FINDINGS Alignment: Normal. Skull base and vertebrae: No acute fracture. No aggressive appearing focal osseous lesion or focal pathologic process. Soft tissues and spinal canal: No prevertebral fluid or swelling. No visible canal hematoma. Upper chest: Biapical pleural/pulmonary scarring. Other: Atherosclerotic plaque of the aortic arch and its main branches. IMPRESSION: 1. No acute intracranial abnormality. 2. No acute displaced fracture or traumatic listhesis of the cervical spine. 3.  Aortic Atherosclerosis (ICD10-I70.0). Electronically Signed   By: Morgane  Naveau M.D.   On: 01/05/2024 21:46    Assessment/Plan Patient with dementia and Behaviors D/W nurses and staff and her daughter in the room Start her on Haldol  1 mg with Meals atnight Will also write for Haldol  I/m PRN for extreme Agitation Ativan  0.5 mg tablet crushed Q 6 PRN for Anxiety She is also on Depakote   Also on Lidocaine  patches for Her low back pain  She is also on Remeron  per request of her daughter in the morning It does make her Sleepy in the morning but daughter thinks it is helping her appetite     Family/ staff Communication:   Labs/tests ordered:

## 2024-02-01 DIAGNOSIS — M545 Low back pain, unspecified: Secondary | ICD-10-CM | POA: Insufficient documentation

## 2024-02-01 DIAGNOSIS — R634 Abnormal weight loss: Secondary | ICD-10-CM | POA: Insufficient documentation

## 2024-02-01 DIAGNOSIS — F0150 Vascular dementia without behavioral disturbance: Secondary | ICD-10-CM | POA: Insufficient documentation

## 2024-02-04 ENCOUNTER — Non-Acute Institutional Stay (SKILLED_NURSING_FACILITY): Payer: Self-pay | Admitting: Orthopedic Surgery

## 2024-02-04 ENCOUNTER — Encounter: Payer: Self-pay | Admitting: Orthopedic Surgery

## 2024-02-04 DIAGNOSIS — F03911 Unspecified dementia, unspecified severity, with agitation: Secondary | ICD-10-CM | POA: Diagnosis not present

## 2024-02-04 DIAGNOSIS — G4709 Other insomnia: Secondary | ICD-10-CM

## 2024-02-04 NOTE — Progress Notes (Signed)
 Location:  [facility] Room Number: 18/A Place of Service:  SNF [phone]) Provider:  Greig FORBES Cluster, NP   Charlanne Fredia CROME, MD  Patient Care Team: Charlanne Fredia CROME, MD as PCP - General (Internal Medicine)  Extended Emergency Contact Information Primary Emergency Contact: Tressa Lamar Kings) Home Phone: [phone] Mobile Phone: [phone] Relation: Grandson Secondary Emergency Contact: [facility] Phone: [phone] Mobile Phone: [phone] Relation: Daughter  Code Status:  DNR Goals of care: Advanced Directive information    01/31/2024   12:49 PM  Advanced Directives  Does Patient Have a Medical Advance Directive? Yes  Type of Advance Directive Living will;Healthcare Power of [place];Out of facility DNR (pink MOST or yellow form)  Does patient want to make changes to medical advance directive? No - Patient declined  Pre-existing out of facility DNR order (yellow form or pink MOST form) Yellow form placed in chart (order not valid for inpatient use)     Chief Complaint  Patient presents with   Acute Visit    Insomnia, confusion    HPI:  Pt is a 89 y.o. female seen today for acute visit due to insomnia and confusion.   She currently resides on the skilled nursing unit at [facility] PMH: CVA, vascular dementia with behaviors, recurrent falls, h/o left femur fracture, biliary obstruction 2022,GERD, protein calorie malnutrition and hearing loss.   Followed by hospice.   Poor historian due to dementia. Recently admitted to SNF from home 12/31/2023. Nursing reports she continues to sleep during the day and awake at night. During the night she is less compliant with staff, often refusing medication, calling it poison. She remains on Remeron  (10/06 increased to BID) and Depakote . Unsuccessful trial of Ativan  gel and tablet. 10/09 she was started on haldol . She is asleep during our encounter. I checked on her later in the day. Appears she awakens  around 2 pm in afternoon. Medication education given to daughter. Plan to discontinue morning Remeron . Afebrile. Vitals stable.    Past Medical History:  Diagnosis Date   Complication of anesthesia    PONV   Past Surgical History:  Procedure Laterality Date   ABDOMINAL HYSTERECTOMY     APPENDECTOMY     BILIARY DILATION  01/31/2021   Procedure: BILIARY DILATION;  Surgeon: Wilhelmenia Aloha Raddle., MD;  Location: [facility];  Service: Gastroenterology;;   BILIARY STENT PLACEMENT N/A 12/07/2020   Procedure: BILIARY STENT PLACEMENT;  Surgeon: Charlanne Groom, MD;  Location: [facility];  Service: Endoscopy;  Laterality: N/A;   CATARACT EXTRACTION, BILATERAL     CHOLECYSTECTOMY     ENDOSCOPIC RETROGRADE CHOLANGIOPANCREATOGRAPHY (ERCP) WITH PROPOFOL  N/A 01/31/2021   Procedure: ENDOSCOPIC RETROGRADE CHOLANGIOPANCREATOGRAPHY (ERCP) WITH PROPOFOL ;  Surgeon: Wilhelmenia Aloha Raddle., MD;  Location: [facility];  Service: Gastroenterology;  Laterality: N/A;   ERCP N/A 12/07/2020   Procedure: ENDOSCOPIC RETROGRADE CHOLANGIOPANCREATOGRAPHY (ERCP);  Surgeon: Charlanne Groom, MD;  Location: [facility];  Service: Endoscopy;  Laterality: N/A;   ERCP     FEMUR IM NAIL Left 06/10/2020   Procedure: INTRAMEDULLARY (IM) NAIL FEMORAL;  Surgeon: Barbarann Oneil BROCKS, MD;  Location: [facility];  Service: Orthopedics;  Laterality: Left;   FOREIGN BODY REMOVAL  01/31/2021   Procedure: FOREIGN BODY REMOVAL;  Surgeon: Wilhelmenia Aloha Raddle., MD;  Location: [facility];  Service: Gastroenterology;;   REMOVAL OF STONES  01/31/2021   Procedure: REMOVAL OF STONES;  Surgeon: Wilhelmenia Aloha Raddle., MD;  Location: [facility];  Service: Gastroenterology;;   ANNETT  01/31/2021   Procedure:  SPHINCTEROTOMY;  Surgeon: Wilhelmenia Aloha Raddle., MD;  Location: [facility];  Service: Gastroenterology;;   LAHOMA CHOLANGIOSCOPY N/A 01/31/2021   Procedure: DEBHOJDD CHOLANGIOSCOPY;  Surgeon: Wilhelmenia Aloha Raddle., MD;   Location: [facility];  Service: Gastroenterology;  Laterality: N/A;   STENT REMOVAL  01/31/2021   Procedure: STENT REMOVAL x2;  Surgeon: Wilhelmenia Aloha Raddle., MD;  Location: [facility];  Service: Gastroenterology;;    Allergies  Allergen Reactions   Red Dye #40 (Allura Red) Anaphylaxis    Outpatient Encounter Medications as of 02/04/2024  Medication Sig   acetaminophen  (TYLENOL ) 650 MG CR tablet Take 650 mg by mouth 2 (two) times daily. Medication can be crushed into puree.   divalproex  (DEPAKOTE  SPRINKLES) 125 MG capsule Take 1 capsule (125 mg total) by mouth daily.   haloperidol  (HALDOL ) 10 MG tablet Take 1 mg by mouth every evening.   haloperidol  lactate (HALDOL ) 5 MG/ML injection Inject 1 mg into the muscle daily as needed.   lidocaine  (HM LIDOCAINE  PATCH) 4 % Place 1 patch onto the skin daily.   loperamide (IMODIUM A-D) 2 MG tablet Take 2 mg by mouth as needed for diarrhea or loose stools.   LORazepam  (ATIVAN ) 0.5 MG tablet Take 0.5 mg by mouth every 4 (four) hours as needed for anxiety.   mirtazapine  (REMERON ) 15 MG tablet Take 1 tablet (15 mg total) by mouth at bedtime.   ondansetron  (ZOFRAN ) 4 MG tablet Take 4 mg by mouth every 6 (six) hours as needed for nausea or vomiting.   ZINC OXIDE, TOPICAL, 10 % CREA Apply 1 Application topically as needed.   No facility-administered encounter medications on file as of 02/04/2024.    Review of Systems  Unable to perform ROS: Dementia    Immunization History  Administered Date(s) Administered   INFLUENZA, HIGH DOSE SEASONAL PF 03/03/2018   Influenza-Unspecified 01/29/2024   Moderna Covid-19 Fall Seasonal Vaccine 58yrs & older 02/16/2023   Moderna Covid-19 Vaccine Bivalent Booster 108yrs & up 02/10/2021   Moderna Sars-Covid-2 Vaccination 05/05/2019, 06/02/2019, 02/27/2020   Unspecified SARS-COV-2 Vaccination 02/06/2022   Pertinent  Health Maintenance Due  Topic Date Due   DEXA SCAN  Never done   Influenza Vaccine   Completed      12/10/2020    9:00 AM 01/31/2021    7:07 AM 01/02/2024   11:56 AM 01/07/2024   11:14 AM 01/09/2024    3:40 PM  Fall Risk  Falls in the past year?   1 1 1   Was there an injury with Fall?   1 1 1   Fall Risk Category Calculator   3 3 2   (RETIRED) Patient Fall Risk Level High fall risk  High fall risk      Patient at Risk for Falls Due to   History of fall(s);Impaired balance/gait History of fall(s);Impaired balance/gait;Impaired mobility History of fall(s);Impaired balance/gait  Fall risk Follow up   Falls evaluation completed;Education provided Falls evaluation completed;Education provided;Falls prevention discussed Falls evaluation completed;Education provided;Falls prevention discussed     Data saved with a previous flowsheet row definition   Functional Status Survey:    Vitals:   02/04/24 1422  BP: 116/76  Pulse: 86  Resp: 18  Temp: (!) 97.3 F (36.3 C)  SpO2: 95%  Weight: 68 lb 4.8 oz (31 kg)  Height: 5' 1 (1.549 m)   Body mass index is 12.91 kg/m. Physical Exam Vitals reviewed.  Constitutional:      Appearance: She is cachectic.  Eyes:     General:  Right eye: No discharge.        Left eye: No discharge.  Cardiovascular:     Rate and Rhythm: Normal rate and regular rhythm.     Pulses: Normal pulses.     Heart sounds: Normal heart sounds.  Pulmonary:     Effort: Pulmonary effort is normal.     Breath sounds: Normal breath sounds.  Abdominal:     General: Bowel sounds are normal.     Palpations: Abdomen is soft.  Musculoskeletal:     Cervical back: Neck supple.     Right lower leg: No edema.     Left lower leg: No edema.  Skin:    General: Skin is warm.     Capillary Refill: Capillary refill takes less than 2 seconds.  Neurological:     General: No focal deficit present.     Mental Status: She is easily aroused. Mental status is at baseline.     Motor: Weakness present.     Gait: Gait abnormal.  Psychiatric:     Comments: Alert to  self/person, follows commands     Labs reviewed: Recent Labs    07/06/23 2023 07/07/23 1049 08/08/23 1614 09/18/23 0629  NA 137 138 134* 130*  K 2.5* 4.3 5.1 2.9*  CL 96* 103 96* 93*  CO2 30 27 30 29   GLUCOSE 93 90 100* 77  BUN 21 14 34* 34*  CREATININE 0.57 0.41* 0.53 0.65  CALCIUM 8.5* 8.2* 9.3 8.9  MG 1.0* 1.4* 1.8  --    Recent Labs    07/07/23 1049 08/08/23 1614 09/18/23 0629  AST 21 38 51*  ALT 17 40 73*  ALKPHOS 76 83 156*  BILITOT 1.3* 0.8 0.6  PROT 5.6* 6.9 6.5  ALBUMIN 3.0* 3.9 3.5   Recent Labs    07/06/23 2023 07/07/23 1049 08/08/23 1614 09/18/23 0629  WBC 8.8 8.3 7.9 7.7  NEUTROABS 5.8  --  5.7 4.8  HGB 12.5 11.7* 13.5 12.7  HCT 37.3 36.9 40.7 39.2  MCV 94.7 98.9 99.8 102.1*  PLT 242 237 235 174   No results found for: TSH No results found for: HGBA1C No results found for: CHOL, HDL, LDLCALC, LDLDIRECT, TRIG, CHOLHDL  Significant Diagnostic Results in last 30 days:  CT Head Wo Contrast Result Date: 01/05/2024 CLINICAL DATA:  Ataxia, cervical trauma; Ataxia, head trauma Arrives via [facility] from [facility]. Pt has a lac to the left side of her forehead. Hx of dementia. Pt. Denies pain EXAM: CT HEAD WITHOUT CONTRAST CT CERVICAL SPINE WITHOUT CONTRAST TECHNIQUE: Multidetector CT imaging of the head and cervical spine was performed following the standard protocol without intravenous contrast. Multiplanar CT image reconstructions of the cervical spine were also generated. RADIATION DOSE REDUCTION: This exam was performed according to the departmental dose-optimization program which includes automated exposure control, adjustment of the mA and/or kV according to patient size and/or use of iterative reconstruction technique. COMPARISON:  CT head and C-spine 09/18/2023. FINDINGS: CT HEAD FINDINGS Brain: Patchy and confluent areas of decreased attenuation are noted throughout the deep and periventricular white matter of the cerebral  hemispheres bilaterally, compatible with chronic microvascular ischemic disease. No evidence of large-territorial acute infarction. No parenchymal hemorrhage. No mass lesion. No extra-axial collection. No mass effect or midline shift. No hydrocephalus. Basilar cisterns are patent. Vascular: No hyperdense vessel. Atherosclerotic calcifications are present within the cavernous internal carotid and vertebral arteries. Skull: No acute fracture or focal lesion. Sinuses/Orbits: Paranasal sinuses and mastoid  air cells are clear. The orbits are unremarkable. Other: None. CT CERVICAL SPINE FINDINGS Alignment: Normal. Skull base and vertebrae: No acute fracture. No aggressive appearing focal osseous lesion or focal pathologic process. Soft tissues and spinal canal: No prevertebral fluid or swelling. No visible canal hematoma. Upper chest: Biapical pleural/pulmonary scarring. Other: Atherosclerotic plaque of the aortic arch and its main branches. IMPRESSION: 1. No acute intracranial abnormality. 2. No acute displaced fracture or traumatic listhesis of the cervical spine. 3.  Aortic Atherosclerosis (ICD10-I70.0). Electronically Signed   By: Morgane  Naveau M.D.   On: 01/05/2024 21:46   CT Cervical Spine Wo Contrast Result Date: 01/05/2024 CLINICAL DATA:  Ataxia, cervical trauma; Ataxia, head trauma Arrives via [facility] for fall from [facility]. Pt has a lac to the left side of her forehead. Hx of dementia. Pt. Denies pain EXAM: CT HEAD WITHOUT CONTRAST CT CERVICAL SPINE WITHOUT CONTRAST TECHNIQUE: Multidetector CT imaging of the head and cervical spine was performed following the standard protocol without intravenous contrast. Multiplanar CT image reconstructions of the cervical spine were also generated. RADIATION DOSE REDUCTION: This exam was performed according to the departmental dose-optimization program which includes automated exposure control, adjustment of the mA and/or kV according to patient size and/or use of  iterative reconstruction technique. COMPARISON:  CT head and C-spine 09/18/2023. FINDINGS: CT HEAD FINDINGS Brain: Patchy and confluent areas of decreased attenuation are noted throughout the deep and periventricular white matter of the cerebral hemispheres bilaterally, compatible with chronic microvascular ischemic disease. No evidence of large-territorial acute infarction. No parenchymal hemorrhage. No mass lesion. No extra-axial collection. No mass effect or midline shift. No hydrocephalus. Basilar cisterns are patent. Vascular: No hyperdense vessel. Atherosclerotic calcifications are present within the cavernous internal carotid and vertebral arteries. Skull: No acute fracture or focal lesion. Sinuses/Orbits: Paranasal sinuses and mastoid air cells are clear. The orbits are unremarkable. Other: None. CT CERVICAL SPINE FINDINGS Alignment: Normal. Skull base and vertebrae: No acute fracture. No aggressive appearing focal osseous lesion or focal pathologic process. Soft tissues and spinal canal: No prevertebral fluid or swelling. No visible canal hematoma. Upper chest: Biapical pleural/pulmonary scarring. Other: Atherosclerotic plaque of the aortic arch and its main branches. IMPRESSION: 1. No acute intracranial abnormality. 2. No acute displaced fracture or traumatic listhesis of the cervical spine. 3.  Aortic Atherosclerosis (ICD10-I70.0). Electronically Signed   By: Morgane  Naveau M.D.   On: 01/05/2024 21:46    Assessment/Plan 1. Other insomnia (Primary) - began after SNF admission - unsuccessful trial ativan  gel and tablet - discontinue Remeron  in AM  - cont Remeron  at bedtime - 10/09 Haldol  started   2. Agitation due to dementia [facility]) - see above - followed by hospice - unsuccessful trial Seroquel > drowsiness  - cont Depakote  and Haldol      Family/ staff Communication: plan discussed with daughter, hospice and nursing   Labs/tests ordered:  none

## 2024-02-06 ENCOUNTER — Other Ambulatory Visit: Payer: Self-pay | Admitting: Orthopedic Surgery

## 2024-02-06 MED ORDER — LORAZEPAM 0.5 MG PO TABS: 0.5000 mg | ORAL_TABLET | ORAL | 0 refills | Status: AC | PRN

## 2024-02-14 ENCOUNTER — Other Ambulatory Visit: Payer: Self-pay | Admitting: Internal Medicine

## 2024-02-14 MED ORDER — LORAZEPAM 1 MG PO TABS: 1.0000 mg | ORAL_TABLET | Freq: Every day | ORAL | 1 refills | Status: DC

## 2024-02-14 NOTE — Progress Notes (Signed)
 Patient is better but still awake at night and screams Will start her on Ativan  1 mg qhs

## 2024-02-21 ENCOUNTER — Non-Acute Institutional Stay (SKILLED_NURSING_FACILITY): Payer: Self-pay | Admitting: Internal Medicine

## 2024-02-21 DIAGNOSIS — R296 Repeated falls: Secondary | ICD-10-CM | POA: Diagnosis not present

## 2024-02-21 DIAGNOSIS — F03911 Unspecified dementia, unspecified severity, with agitation: Secondary | ICD-10-CM | POA: Diagnosis not present

## 2024-02-21 DIAGNOSIS — G4709 Other insomnia: Secondary | ICD-10-CM

## 2024-02-22 ENCOUNTER — Encounter: Payer: Self-pay | Admitting: Internal Medicine

## 2024-02-22 MED ORDER — DIVALPROEX SODIUM 125 MG PO CSDR: DELAYED_RELEASE_CAPSULE | ORAL | Status: AC

## 2024-02-22 MED ORDER — LORAZEPAM 1 MG PO TABS: 1.5000 mg | ORAL_TABLET | Freq: Every day | ORAL | Status: AC

## 2024-02-22 NOTE — Progress Notes (Signed)
 [facility]:  SNF ([id number])  Provider:   Code Status: DNR/Hospice  Goals of Care:     01/31/2024   12:49 PM  Advanced Directives  Does Patient Have a Medical Advance Directive? Yes  Type of Advance Directive Living will;Healthcare Power of [place];Out of facility DNR (pink MOST or yellow form)  Does patient want to make changes to medical advance directive? No - Patient declined  Pre-existing out of facility DNR order (yellow form or pink MOST form) Yellow form placed in chart (order not valid for inpatient use)     Chief Complaint  Patient presents with   Acute Visit    HPI: Patient is a 89 y.o. female seen today for an acute visit for continue behavior issues at night  Patient was living at home with her Daughter Is now admitted under hospice care in [facility]   Patient has h/o CVA , Vascular Dementia, Recurent falls Hearing and Vision Loss and Agitation  Patient is on Haldol  and Ativan  and Depakote  Remeron  but continues to be very restless  Tries to get up She also screams Waking other residents at night I went to see her in her Room and she constantly was trying to get up Her daughter was with her and patient would not sit and keep saying Help Me Help Me    Past Medical History:  Diagnosis Date   Complication of anesthesia    PONV    Past Surgical History:  Procedure Laterality Date   ABDOMINAL HYSTERECTOMY     APPENDECTOMY     BILIARY DILATION  01/31/2021   Procedure: BILIARY DILATION;  Surgeon: Wilhelmenia Aloha Raddle., MD;  Location: [facility];  Service: Gastroenterology;;   BILIARY STENT PLACEMENT N/A 12/07/2020   Procedure: BILIARY STENT PLACEMENT;  Surgeon: Charlanne Groom, MD;  [facility]: [facility];  Service: Endoscopy;  Laterality: N/A;   CATARACT EXTRACTION, BILATERAL     CHOLECYSTECTOMY     ENDOSCOPIC RETROGRADE CHOLANGIOPANCREATOGRAPHY (ERCP) WITH PROPOFOL  N/A 01/31/2021   Procedure: ENDOSCOPIC RETROGRADE  CHOLANGIOPANCREATOGRAPHY (ERCP) WITH PROPOFOL ;  Surgeon: Wilhelmenia Aloha Raddle., MD;  Location: [facility];  Service: Gastroenterology;  Laterality: N/A;   ERCP N/A 12/07/2020   Procedure: ENDOSCOPIC RETROGRADE CHOLANGIOPANCREATOGRAPHY (ERCP);  Surgeon: Charlanne Groom, MD;  Location: [facility];  Service: Endoscopy;  Laterality: N/A;   ERCP     FEMUR IM NAIL Left 06/10/2020   Procedure: INTRAMEDULLARY (IM) NAIL FEMORAL;  Surgeon: Barbarann Oneil BROCKS, MD;  Location: [facility];  Service: Orthopedics;  Laterality: Left;   FOREIGN BODY REMOVAL  01/31/2021   Procedure: FOREIGN BODY REMOVAL;  Surgeon: Wilhelmenia Aloha Raddle., MD;  Location: [facility];  Service: Gastroenterology;;   REMOVAL OF STONES  01/31/2021   Procedure: REMOVAL OF STONES;  Surgeon: Wilhelmenia Aloha Raddle., MD;  Location: [facility];  Service: Gastroenterology;;   ANNETT  01/31/2021   Procedure: ANNETT;  Surgeon: Wilhelmenia Aloha Raddle., MD;  Location: [facility];  Service: Gastroenterology;;   LAHOMA CHOLANGIOSCOPY N/A 01/31/2021   Procedure: DEBHOJDD CHOLANGIOSCOPY;  Surgeon: Wilhelmenia Aloha Raddle., MD;  [facility];  Service: Gastroenterology;  Laterality: N/A;   STENT REMOVAL  01/31/2021   Procedure: STENT REMOVAL x2;  Surgeon: Wilhelmenia Aloha Raddle., MD;  Location: [facility];  Service: Gastroenterology;;    Allergies  Allergen Reactions   Red Dye #40 (Allura Red) Anaphylaxis    Outpatient Encounter Medications as of 02/21/2024  Medication Sig   acetaminophen  (TYLENOL ) 650 MG CR tablet Take 650 mg by  mouth 2 (two) times daily. Medication can be crushed into puree.   divalproex  (DEPAKOTE  SPRINKLES) 125 MG capsule Take 1 capsule (125 mg total) by mouth daily.   haloperidol  (HALDOL ) 10 MG tablet Take 1 mg by mouth every evening.   haloperidol  lactate (HALDOL ) 5 MG/ML injection Inject 1 mg into the muscle daily as needed.   lidocaine  (HM LIDOCAINE  PATCH) 4 % Place 1 patch onto the skin  daily.   loperamide (IMODIUM A-D) 2 MG tablet Take 2 mg by mouth as needed for diarrhea or loose stools.   LORazepam  (ATIVAN ) 0.5 MG tablet Take 1 tablet (0.5 mg total) by mouth every 4 (four) hours as needed for anxiety.   LORazepam  (ATIVAN ) 1 MG tablet Take 1 tablet (1 mg total) by mouth at bedtime.   mirtazapine  (REMERON ) 15 MG tablet Take 1 tablet (15 mg total) by mouth at bedtime.   ondansetron  (ZOFRAN ) 4 MG tablet Take 4 mg by mouth every 6 (six) hours as needed for nausea or vomiting.   ZINC OXIDE, TOPICAL, 10 % CREA Apply 1 Application topically as needed.   No facility-administered encounter medications on file as of 02/21/2024.    Review of Systems:  Review of Systems  Unable to perform ROS: Dementia    Health Maintenance  Topic Date Due   DTaP/Tdap/Td (1 - Tdap) Never done   Pneumococcal Vaccine: 50+ Years (1 of 1 - PCV) Never done   Zoster Vaccines- Shingrix (1 of 2) Never done   DEXA SCAN  Never done   COVID-19 Vaccine (7 - 2025-26 season) 12/24/2023   Influenza Vaccine  Completed   Meningococcal B Vaccine  Aged Out    Physical Exam: Vitals:   02/21/24 1302  BP: 116/76  Pulse: 87  Resp: 18  Temp: 97.8 F (36.6 C)  Weight: 68 lb 4.8 oz (31 kg)   Body mass index is 12.91 kg/m. Physical Exam Vitals reviewed.  Constitutional:      Comments: Very frail  HENT:     Head: Normocephalic.     Nose: Nose normal.     Mouth/Throat:     Mouth: Mucous membranes are moist.  Musculoskeletal:        General: No swelling.     Cervical back: Neck supple.  Skin:    General: Skin is warm.  Neurological:     General: No focal deficit present.     Mental Status: She is alert.  Psychiatric:     Comments: Very Anxious     Labs reviewed: Basic Metabolic Panel: Recent Labs    07/06/23 2023 07/07/23 1049 08/08/23 1614 09/18/23 0629  NA 137 138 134* 130*  K 2.5* 4.3 5.1 2.9*  CL 96* 103 96* 93*  CO2 30 27 30 29   GLUCOSE 93 90 100* 77  BUN 21 14 34* 34*   CREATININE 0.57 0.41* 0.53 0.65  CALCIUM 8.5* 8.2* 9.3 8.9  MG 1.0* 1.4* 1.8  --    Liver Function Tests: Recent Labs    07/07/23 1049 08/08/23 1614 09/18/23 0629  AST 21 38 51*  ALT 17 40 73*  ALKPHOS 76 83 156*  BILITOT 1.3* 0.8 0.6  PROT 5.6* 6.9 6.5  ALBUMIN 3.0* 3.9 3.5   Recent Labs    08/08/23 1614  LIPASE 46   No results for input(s): AMMONIA in the last 8760 hours. CBC: Recent Labs    07/06/23 2023 07/07/23 1049 08/08/23 1614 09/18/23 0629  WBC 8.8 8.3 7.9 7.7  NEUTROABS 5.8  --  5.7 4.8  HGB 12.5 11.7* 13.5 12.7  HCT 37.3 36.9 40.7 39.2  MCV 94.7 98.9 99.8 102.1*  PLT 242 237 235 174   Lipid Panel: No results for input(s): CHOL, HDL, LDLCALC, TRIG, CHOLHDL, LDLDIRECT in the last 8760 hours. No results found for: HGBA1C  Procedures since last visit: No results found.  Assessment/Plan Patient with Dementia and Insomnia and Behaviors Will Increase her Ativan  to 1.5 mg Continue on Haldol  Remeron  and change Depakote  to 250 mg at night    Labs/tests ordered:  * No order type specified * Next appt:  Visit date not found

## 2024-03-24 DEATH — deceased
